# Patient Record
Sex: Male | Born: 1960 | Race: Black or African American | Hispanic: No | Marital: Married | Smoking: Current every day smoker
Health system: Southern US, Community
[De-identification: ages and names within clinical notes are randomized; demographics above are authoritative.]

## PROBLEM LIST (undated history)

## (undated) DIAGNOSIS — I10 Essential (primary) hypertension: Secondary | ICD-10-CM

## (undated) DIAGNOSIS — G4730 Sleep apnea, unspecified: Secondary | ICD-10-CM

## (undated) DIAGNOSIS — E119 Type 2 diabetes mellitus without complications: Secondary | ICD-10-CM

## (undated) DIAGNOSIS — R079 Chest pain, unspecified: Secondary | ICD-10-CM

## (undated) HISTORY — DX: Sleep apnea, unspecified: G47.30

## (undated) HISTORY — DX: Type 2 diabetes mellitus without complications: E11.9

## (undated) HISTORY — DX: Chest pain, unspecified: R07.9

## (undated) HISTORY — PX: LIPOMA EXCISION: SHX5283

## (undated) HISTORY — DX: Essential (primary) hypertension: I10

---

## 2004-04-13 ENCOUNTER — Ambulatory Visit: Payer: Self-pay | Admitting: General Practice

## 2007-01-17 ENCOUNTER — Other Ambulatory Visit: Payer: Self-pay

## 2007-01-17 ENCOUNTER — Emergency Department: Payer: Self-pay | Admitting: Emergency Medicine

## 2007-02-21 ENCOUNTER — Ambulatory Visit: Payer: Self-pay | Admitting: Family Medicine

## 2007-03-03 ENCOUNTER — Emergency Department: Payer: Self-pay | Admitting: Emergency Medicine

## 2007-12-17 ENCOUNTER — Emergency Department: Payer: Self-pay | Admitting: Emergency Medicine

## 2008-08-19 ENCOUNTER — Emergency Department: Payer: Self-pay | Admitting: Emergency Medicine

## 2009-02-05 ENCOUNTER — Emergency Department: Payer: Self-pay | Admitting: Emergency Medicine

## 2009-06-24 ENCOUNTER — Emergency Department: Payer: Self-pay | Admitting: Emergency Medicine

## 2011-05-14 ENCOUNTER — Emergency Department: Payer: Self-pay | Admitting: *Deleted

## 2011-08-06 ENCOUNTER — Emergency Department: Payer: Self-pay | Admitting: Emergency Medicine

## 2012-10-09 ENCOUNTER — Emergency Department: Payer: Self-pay | Admitting: Emergency Medicine

## 2012-12-15 ENCOUNTER — Emergency Department: Payer: Self-pay | Admitting: Emergency Medicine

## 2013-01-18 ENCOUNTER — Ambulatory Visit: Payer: Self-pay | Admitting: Family Medicine

## 2013-11-19 ENCOUNTER — Ambulatory Visit: Payer: Self-pay | Admitting: Gastroenterology

## 2013-12-09 ENCOUNTER — Inpatient Hospital Stay: Payer: Self-pay | Admitting: Internal Medicine

## 2013-12-09 LAB — URINALYSIS, COMPLETE
Bacteria: NONE SEEN
Bilirubin,UR: NEGATIVE
Leukocyte Esterase: NEGATIVE
Nitrite: NEGATIVE
PH: 5 (ref 4.5–8.0)
Protein: NEGATIVE
RBC,UR: 1 /HPF (ref 0–5)
Specific Gravity: 1.026 (ref 1.003–1.030)
Squamous Epithelial: <1
WBC UR: <1 /HPF (ref 0–5)

## 2013-12-09 LAB — COMPREHENSIVE METABOLIC PANEL
ALBUMIN: 3.6 g/dL (ref 3.4–5.0)
ANION GAP: 12 (ref 7–16)
AST: 44 U/L — AB (ref 15–37)
Alkaline Phosphatase: 169 U/L — ABNORMAL HIGH
BUN: 23 mg/dL — AB (ref 7–18)
Bilirubin,Total: 0.8 mg/dL (ref 0.2–1.0)
CREATININE: 0.49 mg/dL — AB (ref 0.60–1.30)
Chloride: 94 mmol/L — ABNORMAL LOW (ref 98–107)
Co2: 22 mmol/L (ref 21–32)
GLUCOSE: 711 mg/dL — AB (ref 65–99)
OSMOLALITY: 298 (ref 275–301)
Potassium: 5.2 mmol/L — ABNORMAL HIGH (ref 3.5–5.1)
SGPT (ALT): 61 U/L (ref 12–78)
Sodium: 128 mmol/L — ABNORMAL LOW (ref 136–145)

## 2013-12-09 LAB — CBC
HCT: 43 % (ref 40.0–52.0)
HGB: 15.4 g/dL (ref 13.0–18.0)
MCH: 33.9 pg (ref 26.0–34.0)
MCHC: 35.8 g/dL (ref 32.0–36.0)
MCV: 95 fL (ref 80–100)
Platelet: 227 10*3/uL (ref 150–440)
RBC: 4.54 10*6/uL (ref 4.40–5.90)
RDW: 12.6 % (ref 11.5–14.5)
WBC: 15.6 10*3/uL — ABNORMAL HIGH (ref 3.8–10.6)

## 2013-12-09 LAB — TROPONIN I: Troponin-I: <0.02 ng/mL

## 2013-12-10 LAB — BASIC METABOLIC PANEL
Anion Gap: 10 (ref 7–16)
BUN: 11 mg/dL (ref 7–18)
Calcium, Total: 8.1 mg/dL — ABNORMAL LOW (ref 8.5–10.1)
Chloride: 104 mmol/L (ref 98–107)
Co2: 21 mmol/L (ref 21–32)
Creatinine: 0.28 mg/dL — ABNORMAL LOW (ref 0.60–1.30)
EGFR (African American): >60
EGFR (Non-African Amer.): >60
Glucose: 222 mg/dL — ABNORMAL HIGH (ref 65–99)
Osmolality: 276 (ref 275–301)
Potassium: 4.7 mmol/L (ref 3.5–5.1)
Sodium: 135 mmol/L — ABNORMAL LOW (ref 136–145)

## 2013-12-10 LAB — CBC WITH DIFFERENTIAL/PLATELET
Comment - H1-Com1: NORMAL
HCT: 36.5 % — ABNORMAL LOW (ref 40.0–52.0)
HGB: 13 g/dL (ref 13.0–18.0)
Lymphocytes: 33 %
MCH: 32.6 pg (ref 26.0–34.0)
MCHC: 35.6 g/dL (ref 32.0–36.0)
MCV: 91 fL (ref 80–100)
Monocytes: 9 %
Platelet: 194 10*3/uL (ref 150–440)
RBC: 4 10*6/uL — ABNORMAL LOW (ref 4.40–5.90)
RDW: 12.5 % (ref 11.5–14.5)
Segmented Neutrophils: 58 %
WBC: 13.3 10*3/uL — ABNORMAL HIGH (ref 3.8–10.6)

## 2013-12-10 LAB — TSH: Thyroid Stimulating Horm: 0.9 u[IU]/mL

## 2013-12-10 LAB — HEMOGLOBIN A1C: Hemoglobin A1C: 13.5 % — ABNORMAL HIGH (ref 4.2–6.3)

## 2013-12-11 LAB — CBC WITH DIFFERENTIAL/PLATELET
Basophil #: 0 10*3/uL (ref 0.0–0.1)
Basophil %: 0.3 %
EOS ABS: 0.1 10*3/uL (ref 0.0–0.7)
Eosinophil %: 1.3 %
HCT: 36.1 % — AB (ref 40.0–52.0)
HGB: 12.3 g/dL — AB (ref 13.0–18.0)
Lymphocyte #: 4.1 10*3/uL — ABNORMAL HIGH (ref 1.0–3.6)
Lymphocyte %: 39.7 %
MCH: 31.2 pg (ref 26.0–34.0)
MCHC: 34.1 g/dL (ref 32.0–36.0)
MCV: 92 fL (ref 80–100)
MONO ABS: 0.7 x10 3/mm (ref 0.2–1.0)
Monocyte %: 7.2 %
Neutrophil #: 5.4 10*3/uL (ref 1.4–6.5)
Neutrophil %: 51.5 %
Platelet: 162 10*3/uL (ref 150–440)
RBC: 3.94 10*6/uL — AB (ref 4.40–5.90)
RDW: 12.4 % (ref 11.5–14.5)
WBC: 10.4 10*3/uL (ref 3.8–10.6)

## 2013-12-11 LAB — BASIC METABOLIC PANEL
Anion Gap: 5 — ABNORMAL LOW (ref 7–16)
BUN: 10 mg/dL (ref 7–18)
CHLORIDE: 103 mmol/L (ref 98–107)
CO2: 26 mmol/L (ref 21–32)
Calcium, Total: 8.5 mg/dL (ref 8.5–10.1)
Creatinine: 0.59 mg/dL — ABNORMAL LOW (ref 0.60–1.30)
EGFR (African American): >60
EGFR (Non-African Amer.): >60
GLUCOSE: 193 mg/dL — AB (ref 65–99)
Osmolality: 273 (ref 275–301)
Potassium: 4 mmol/L (ref 3.5–5.1)
Sodium: 134 mmol/L — ABNORMAL LOW (ref 136–145)

## 2013-12-14 LAB — CULTURE, BLOOD (SINGLE)

## 2014-10-26 NOTE — Discharge Summary (Signed)
PATIENT NAME:  [name], [name] MR#:  [id number] DATE OF BIRTH:  17-Jan-1961  DATE OF ADMISSION:  12/09/2013 DATE OF DISCHARGE:  12/11/2013  DIAGNOSES AT TIME OF DISCHARGE: 1.  Uncontrolled diabetes with severe hyperglycemia secondary to tooth infection.  2.  Hypertension.  3.  Chronic tobacco use.   CHIEF COMPLAINT:  Blurred vision, polydipsia, polyphagia and elevated sugars.   Johaan Ryser is a 54 year old African American male with a history of diabetes since 2012,  history of hypertension, presents to the ED complaining of pain in the left lower teeth on the posterior aspect for the last few days. He has also noticed increased frequency of urination and increased thirst associated with blurring of vision. The patient's blood sugar was noted to be elevated in the ED at 711 and was given IV insulin and subsequently admitted. The patient has been complaining of pain, tooth infection. Denies any chest pain, fevers, chills or vomiting.   PAST MEDICAL HISTORY:  Significant for type 2 diabetes and hypertension. He has had a cyst  resected from his head. Please see H and P for other details.   HOSPITAL COURSE: The patient was admitted to [facility] and received IV fluids, I and also placed on a sliding scale and this was subsequently switched to glipizide, and he was also started on IV Unasyn. He was advised to quit smoking completely and nicotine replacement therapy was also started. During his stay in the hospital, the patient's sugars gradually improved and was 193 on day of discharge. He was started on glipizide and metformin. Blood cultures were negative. UA was also negative. The patient was discharged in stable condition on the following medications:  Penicillin V potassium 500 mg p.o. q.i.d. for 10 days, nicotine patch 14 mg a day for 24 hours, decrease to 7 mg a day per 24 hours for 2 weeks and then stop; glipizide 10 mg b.i.d., oxybutynin 5 mg once a day, losartan 50 mg once a day, amlodipine 10 mg once  a day, metformin 500 mg p.o. b.i.d.   The patient has been advised to continue to monitor his sugars at home. Advised a strict low-carb diet and follow up with me, Dr. Ginette Pitman, it 1 to 2 weeks' time. I also advised him to see a dentist as soon as possible. He has been advised to call back if there are any questions or concerns. The patient was stable at the time of discharge.   TOTAL TIME SPENT IN DISCHARGING THE PATIENT: 35 minutes.   ____________________________ Tracie Harrier, MD vh:dmm D: 12/13/2013 15:45:02 ET T: 12/13/2013 17:41:16 ET JOB#: [id number]  cc: Tracie Harrier, MD, <Dictator> Tracie Harrier MD ELECTRONICALLY SIGNED 01/01/2014 18:06

## 2014-10-26 NOTE — H&P (Signed)
PATIENT NAME:  [name], [name] MR#:  [id number] DATE OF BIRTH:  Jun 12, 1961  DATE OF ADMISSION:  12/09/2013  PRIMARY CARE PROVIDER: Tracie Harrier, MD  EMERGENCY DEPARTMENT REFERRING PHYSICIAN: Sheryl L. Benjaman Lobe, MD  CHIEF COMPLAINT: Blurred vision, blood glucose very high, polydipsia, polyphagia.   HISTORY OF PRESENT ILLNESS: The patient is a 54 year old African American male with history of diabetes since 2012, history of hypertension, who reports that he started having pain in the left side of his lower teeth on the posterior aspect. Over the past few days, he has been  noticing that he has to urinate very frequently and has been very thirsty. He also noticed to have blurred vision. Due to these symptoms, he came to the ED and his blood sugar on a BMP showed a glucose of 711. The patient was given 1 dose of IV insulin. We were asked to admit the patient. He likely has a tooth infection as well. He otherwise denies any fevers, chills. No chest pains, palpitations. Complains of dry cough. No wheezing. No shortness of breath. No nausea, vomiting, diarrhea. Complains of polydipsia, polyuria.   PAST MEDICAL HISTORY: 1.  Diabetes since 2012.  2.  Hypertension.   PAST SURGICAL HISTORY: Cyst resection from head.   ALLERGIES: INTRAVENOUS PYELOGRAM DYE.   CURRENT MEDICATIONS: He is on oxybutynin 5 mg q.24 hours, metformin 500 mg 1 tab p.o. b.i.d., losartan 50 mg 1 tab p.o. daily, amlodipine 10 daily.   SOCIAL HISTORY: Smokes less than 1 pack every 3 days, he has been smoking since the age of 20. No alcohol or drug use.   FAMILY HISTORY: Father with diabetes.   REVIEW OF SYSTEMS:    CONSTITUTIONAL: Denies any fevers. Complains of some fatigue, weakness. No pain. No weight loss. No weight gain.  EYES: He complains of blurred vision. No double vision. No pain. No redness. No inflammation.  ENT: No tinnitus. No ear pain. No hearing loss. No allergies, seasonal or year-round allergies. No epistaxis.  No difficulty swallowing.  RESPIRATORY: Complains of dry cough. No wheezing. No hemoptysis. No chronic obstructive pulmonary disease.  CARDIOVASCULAR: No chest pain, orthopnea, edema or arrhythmia.  GASTROINTESTINAL: No nausea, vomiting, diarrhea. No abdominal pain. No hematemesis. No melena. No gastroesophageal reflux disease, no irritable bowel syndrome. No jaundice.  GENITOURINARY: Denies any dysuria, hematuria.  ENDOCRINE:  Denies any polyuria, nocturia or thyroid problems.  HEMATOLOGIC AND LYMPHATIC: Denies anemia, easy bruisability or bleeding.  SKIN: No acne. No rash.  MUSCULOSKELETAL: Denies any pain in the neck, back or shoulder.  NEUROLOGIC: No numbness, cerebrovascular accident,  transient ischemic attack.  PSYCHIATRIC: No anxiety, insomnia or attention deficit disorder.   PHYSICAL EXAMINATION: VITAL SIGNS: Temperature 98.2, pulse 115, respirations 18, blood pressure 154/92, O2 of 95%.  GENERAL: The patient is a well-developed male in no acute distress.  HEENT:  Normocephalic. Pupils equally round, reactive to light and accommodation. There is no conjunctival pallor. No scleral icterus. Oropharynx: He has a tooth on the left posterior aspect of his mouth with some inflammation as well as some drainage.  NECK: Supple without any JVD.  CARDIOVASCULAR: Regular rate and rhythm. No murmurs, rubs, clicks or gallops.  LUNGS: Clear to auscultation bilaterally without any rales, rhonchi, wheezing.  ABDOMEN: Soft, nontender, nondistended. Positive bowel sounds x 4.  EXTREMITIES: No clubbing, cyanosis or edema.  SKIN: No rash.  LYMPHATICS: No lymph nodes palpable.  VASCULAR: Good DP and PT pulses.   PSYCHIATRIC: Not anxious or depressed.  NEUROLOGIC: Awake, alert, oriented  x 3. No focal deficits.   LABORATORY, DIAGNOSTIC AND RADIOLOGICAL DATA: Urinalysis: Nitrites negative, leukocytes negative. Glucose 711, sodium 128, chloride 94, CO2 22, bilirubin total 0.8. Troponin less than 0.02. WBC  15.6, hemoglobin 15.4, platelet count 227.   ASSESSMENT AND PLAN: The patient is a 54 year old African American male with history of diabetes, on p.o. metformin, is presenting the ED with severe hyperglycemia, likely tooth infection.  1.  Severe hyperglycemia, likely due to dietary indiscretion and tooth infection. At this time, he will receive IV insulin dose x 1. He will be placed on an insulin drip. Monitor his blood sugars every hour. . I will increase his dose of metformin. I will add glipizide. The patient is very hesitant to start insulin at home. Also, his sugars could be exacerbated by the tooth infection that he has so he may be okay with oral treatment. We will also check a hemoglobin A1c.   2.  Tooth infection.  He will be on IV Unasyn. He will need outpatient dental evaluation with extraction of that tooth.  3.  Hypertension. Consent losartan and amlodipine.  4.  Miscellaneous. He will be on heparin for deep vein thrombosis prophylaxis.  5.  Nicotine addiction. The patient was given smoking cessation, 4 minutes spent. The patient will be started on a nicotine patch.    TIME SPENT:  50 minutes.     ____________________________ Lafonda Mosses Posey Pronto, MD shp:cs D: 12/09/2013 16:08:12 ET T: 12/09/2013 18:16:29 ET JOB#: [id number]  cc: Masiah Lewing H. Posey Pronto, MD, <Dictator> Alric Seton MD ELECTRONICALLY SIGNED 12/18/2013 16:52

## 2015-05-25 ENCOUNTER — Ambulatory Visit: Payer: No Typology Code available for payment source | Attending: Neurology

## 2015-05-25 DIAGNOSIS — G4733 Obstructive sleep apnea (adult) (pediatric): Secondary | ICD-10-CM | POA: Diagnosis not present

## 2015-05-25 DIAGNOSIS — R0683 Snoring: Secondary | ICD-10-CM | POA: Diagnosis present

## 2015-05-25 DIAGNOSIS — G4761 Periodic limb movement disorder: Secondary | ICD-10-CM | POA: Insufficient documentation

## 2015-07-06 HISTORY — PX: COLONOSCOPY: SHX174

## 2015-08-15 ENCOUNTER — Ambulatory Visit: Payer: BLUE CROSS/BLUE SHIELD | Attending: Neurology

## 2015-08-15 DIAGNOSIS — G4733 Obstructive sleep apnea (adult) (pediatric): Secondary | ICD-10-CM | POA: Insufficient documentation

## 2017-02-07 ENCOUNTER — Ambulatory Visit: Payer: No Typology Code available for payment source | Admitting: Dietician

## 2017-02-11 ENCOUNTER — Encounter: Payer: Self-pay | Admitting: *Deleted

## 2017-02-11 ENCOUNTER — Encounter: Payer: BLUE CROSS/BLUE SHIELD | Attending: Internal Medicine | Admitting: *Deleted

## 2017-02-11 VITALS — BP 142/80 | Ht 73.0 in | Wt 229.5 lb

## 2017-02-11 DIAGNOSIS — E119 Type 2 diabetes mellitus without complications: Secondary | ICD-10-CM | POA: Insufficient documentation

## 2017-02-11 DIAGNOSIS — Z713 Dietary counseling and surveillance: Secondary | ICD-10-CM | POA: Diagnosis not present

## 2017-02-11 DIAGNOSIS — E1165 Type 2 diabetes mellitus with hyperglycemia: Secondary | ICD-10-CM

## 2017-02-11 NOTE — Progress Notes (Signed)
Diabetes Self-Management Education  Visit Type: First/Initial  Appt. Start Time: 1115 Appt. End Time: 1225  02/11/2017  [name], identified by name and date of birth, is a 56 y.o. male with a diagnosis of Diabetes: Type 2.   ASSESSMENT  Blood pressure (!) 142/80, height 6\' 1"  (1.854 m), weight 229 lb 8 oz (104.1 kg). Body mass index is 30.28 kg/m.      Diabetes Self-Management Education - 02/11/17 1252      Visit Information   Visit Type First/Initial     Initial Visit   Diabetes Type Type 2   Are you currently following a meal plan? No   Are you taking your medications as prescribed? Yes   Date Diagnosed 6+ years     Health Coping   How would you rate your overall health? Fair     Psychosocial Assessment   Patient Belief/Attitude about Diabetes Other (comment)  "confused" - not sure why he has diabetes   Self-care barriers None   Self-management support Doctor's office;Family   Patient Concerns Nutrition/Meal planning;Medication;Glycemic Control;Weight Control;Monitoring   Special Needs None   Preferred Learning Style Auditory   Learning Readiness Ready   How often do you need to have someone help you when you read instructions, pamphlets, or other written materials from your doctor or pharmacy? 1 - Never   What is the last grade level you completed in school? 12th grade     Pre-Education Assessment   Patient understands the diabetes disease and treatment process. Needs Instruction   Patient understands incorporating nutritional management into lifestyle. Needs Instruction   Patient undertands incorporating physical activity into lifestyle. Needs Instruction   Patient understands using medications safely. Needs Instruction   Patient understands monitoring blood glucose, interpreting and using results Needs Review   Patient understands prevention, detection, and treatment of acute complications. Needs Instruction   Patient understands prevention, detection,  and treatment of chronic complications. Needs Instruction   Patient understands how to develop strategies to address psychosocial issues. Needs Instruction   Patient understands how to develop strategies to promote health/change behavior. Needs Instruction     Complications   Last HgB A1C per patient/outside source 7.9 %  February 2018   How often do you check your blood sugar? 1-2 times/day   Fasting Blood glucose range (mg/dL) 180-200;>200  Pt reports FBG's 180-200's mg/dL with reading of 187 mg/dL today.    Have you had a dilated eye exam in the past 12 months? No   Have you had a dental exam in the past 12 months? No   Are you checking your feet? Yes   How many days per week are you checking your feet? 2     Dietary Intake   Breakfast eggs with bacon or sausage and 3 pieces white toast; cereal and milk   Lunch hot dog, hamburger and fries, ham or bologna sandwich with chips   Dinner chicken, roast beef, pork, salmon with potatoes, bread, peas, corn, beans, onions, tomatoes, broccoli   Snack (evening) ice cream   Beverage(s) water, diet soda, coffee with sugar, occasional sweet tea     Exercise   Exercise Type ADL's     Patient Education   Previous Diabetes Education No   Disease state  Definition of diabetes, type 1 and 2, and the diagnosis of diabetes;Factors that contribute to the development of diabetes   Nutrition management  Role of diet in the treatment of diabetes and the relationship between the three main  macronutrients and blood glucose level;Reviewed blood glucose goals for pre and post meals and how to evaluate the patients' food intake on their blood glucose level.   Physical activity and exercise  Role of exercise on diabetes management, blood pressure control and cardiac health.   Medications Reviewed patients medication for diabetes, action, purpose, timing of dose and side effects.   Monitoring Purpose and frequency of SMBG.;Taught/discussed recording of test  results and interpretation of SMBG.;Identified appropriate SMBG and/or A1C goals.   Chronic complications Relationship between chronic complications and blood glucose control;Retinopathy and reason for yearly dilated eye exams   Psychosocial adjustment Identified and addressed patients feelings and concerns about diabetes   Personal strategies to promote health Review risk of smoking and offered smoking cessation     Individualized Goals (developed by patient)   Reducing Risk Improve blood sugars Decrease medications Prevent diabetes complications Lose weight Quit smoking     Outcomes   Expected Outcomes Demonstrated interest in learning. Expect positive outcomes   Future DMSE 4-6 wks      Individualized Plan for Diabetes Self-Management Training:   Learning Objective:  Patient will have a greater understanding of diabetes self-management. Patient education plan is to attend individual and/or group sessions per assessed needs and concerns.   Plan:   Patient Instructions  Check blood sugars 1 x day before breakfast or 2 hrs after one meal every day Bring blood sugar records to the next class Exercise: Begin walking  for 10-15  minutes  3 days a week and gradually increase to 150 minutes/week Eat 3 meals day, 1-2 snacks a day Space meals 4-6 hours apart Don't skip meals Limit fried foods and foods high in fat Avoid sugar sweetened drinks (coffee) Quit smoking Make an eye doctor appointment   Expected Outcomes:  Demonstrated interest in learning. Expect positive outcomes  Education material provided:  General Meal Planning Guidelines Simple Meal Plan  If problems or questions, patient to contact team via:  Johny Drilling, RN, [place], CDE [phone]  Future DSME appointment: 4-6 wks  Sept 6, 2018 for Diabetes Class 1

## 2017-02-11 NOTE — Patient Instructions (Signed)
Check blood sugars 1 x day before breakfast or 2 hrs after one meal every day Bring blood sugar records to the next class  Exercise: Begin walking  for 10-15  minutes  3 days a week and gradually increase to 150 minutes/week  Eat 3 meals day, 1-2 snacks a day Space meals 4-6 hours apart Don't skip meals Limit fried foods and foods high in fat Avoid sugar sweetened drinks (coffee)  Quit smoking  Make an eye doctor appointment  Return for classes on:

## 2017-03-10 ENCOUNTER — Encounter: Payer: Self-pay | Admitting: Dietician

## 2017-03-10 ENCOUNTER — Encounter: Payer: BLUE CROSS/BLUE SHIELD | Attending: Internal Medicine | Admitting: Dietician

## 2017-03-10 VITALS — Wt 228.2 lb

## 2017-03-10 DIAGNOSIS — E119 Type 2 diabetes mellitus without complications: Secondary | ICD-10-CM | POA: Insufficient documentation

## 2017-03-10 DIAGNOSIS — Z713 Dietary counseling and surveillance: Secondary | ICD-10-CM | POA: Diagnosis not present

## 2017-03-10 DIAGNOSIS — E1165 Type 2 diabetes mellitus with hyperglycemia: Secondary | ICD-10-CM

## 2017-03-10 NOTE — Progress Notes (Signed)

## 2017-03-17 ENCOUNTER — Ambulatory Visit: Payer: No Typology Code available for payment source

## 2017-03-17 ENCOUNTER — Telehealth: Payer: Self-pay | Admitting: *Deleted

## 2017-03-17 NOTE — Telephone Encounter (Signed)
Patient was no show for Class 2 today. Called him and he reported that he thought today was Wednesday. He reports numbers "jumping around". He had 129 mg/dL one evening but the next morning was 200's mg/dL. Discussed meal planning and stress that affects his blood sugars. He reports that his brother died 2 weeks ago and his mother is now in the hospital. Instructed him to come next week to Class 3 and we can reschedule Class 2 at that time.

## 2017-03-24 ENCOUNTER — Ambulatory Visit: Payer: No Typology Code available for payment source

## 2017-03-25 ENCOUNTER — Telehealth: Payer: Self-pay | Admitting: Dietician

## 2017-03-25 NOTE — Telephone Encounter (Signed)
Patient missed the past 2 classes on 03/17/17 and 03/24/17; called and left a voicemail message for him to call back to reschedule.

## 2017-05-02 ENCOUNTER — Encounter: Payer: Self-pay | Admitting: *Deleted

## 2017-07-05 DIAGNOSIS — R079 Chest pain, unspecified: Secondary | ICD-10-CM

## 2017-07-05 HISTORY — DX: Chest pain, unspecified: R07.9

## 2017-10-04 ENCOUNTER — Telehealth: Payer: Self-pay | Admitting: General Surgery

## 2017-10-04 ENCOUNTER — Encounter: Payer: Self-pay | Admitting: General Surgery

## 2017-10-11 NOTE — Telephone Encounter (Signed)
error 

## 2017-11-02 ENCOUNTER — Encounter: Payer: Self-pay | Admitting: *Deleted

## 2017-11-08 ENCOUNTER — Encounter: Payer: Self-pay | Admitting: General Surgery

## 2017-11-08 ENCOUNTER — Ambulatory Visit: Payer: BLUE CROSS/BLUE SHIELD | Admitting: General Surgery

## 2017-11-08 VITALS — BP 168/96 | HR 78 | Resp 12 | Ht 73.0 in | Wt 238.0 lb

## 2017-11-08 DIAGNOSIS — D171 Benign lipomatous neoplasm of skin and subcutaneous tissue of trunk: Secondary | ICD-10-CM | POA: Diagnosis not present

## 2017-11-08 MED ORDER — DIAZEPAM 10 MG PO TABS: 10.0000 mg | ORAL_TABLET | Freq: Once | ORAL | 0 refills | Status: AC

## 2017-11-08 NOTE — Progress Notes (Signed)
**Note Tyler-Identified via Obfuscation** Patient ID: [name], male   DOB: [birth year], 57 y.o.   MRN: [id number]  Chief Complaint  Patient presents with  . Lipoma    HPI [name] is a 57 y.o. male here today for a evaluation of 2 lipomas on back . He states they have been there for 5 years and have gotten larger over 9 months. He states they are uncomfortable when he leans on his back in a chair. He also has one on his left and right chest and right upper arm. The left back lesion is most symptomatic, especially when he sits in a hard back chair. Followed by Dr Saralyn Pilar for intermittent  chest pain. He is here with [name], his wife. He works as a Engineer, maintenance (IT) at [employer].  HPI  Past Medical History:  Diagnosis Date  . Chest pain 2019  . Diabetes mellitus without complication ([place])   . Hypertension   . Sleep apnea     Past Surgical History:  Procedure Laterality Date  . COLONOSCOPY  2017  . LIPOMA EXCISION     Left forehead    Family History  Problem Relation Age of Onset  . Diabetes Father   . Colon cancer Neg Hx     Social History Social History   Tobacco Use  . Smoking status: Current Every Day Smoker    Packs/day: 0.30    Years: 32.00    Pack years: 9.60    Types: Cigarettes  . Smokeless tobacco: Never Used  Substance Use Topics  . Alcohol use: No  . Drug use: Never    Allergies  Allergen Reactions  . Enalapril Maleate Anaphylaxis  . Canagliflozin Other (See Comments)    Current Outpatient Medications  Medication Sig Dispense Refill  . aspirin EC 81 MG tablet Take 81 mg by mouth daily.    Marland Kitchen losartan (COZAAR) 25 MG tablet Take 25 mg by mouth daily.    . metFORMIN (GLUCOPHAGE-XR) 500 MG 24 hr tablet Take 2,000 mg by mouth daily with supper.    . saxagliptin HCl (ONGLYZA) 5 MG TABS tablet Take 5 mg by mouth daily with breakfast.    . diazepam (VALIUM) 10 MG tablet Take 1 tablet (10 mg total) by mouth once for 1 dose. Take one hour Prior to procedure 1 tablet 0   No current  facility-administered medications for this visit.     Review of Systems Review of Systems  Constitutional: Negative.   Respiratory: Negative.   Cardiovascular: Negative.     Blood pressure (!) 168/96, pulse 78, resp. rate 12, height 6\' 1"  (1.854 m), weight 238 lb (108 kg), SpO2 96 %.  Physical Exam Physical Exam  Constitutional: He is oriented to person, place, and time. He appears well-developed and well-nourished.  HENT:  Mouth/Throat: No oropharyngeal exudate.  Eyes: No scleral icterus.  Neck: Normal range of motion. Neck supple.  Cardiovascular: Normal rate, regular rhythm and normal heart sounds.  Pulmonary/Chest: Effort normal and breath sounds normal.        Abdominal:    Neurological: He is alert and oriented to person, place, and time.  Skin: Skin is warm and dry.  Left back lower scapula 8 x 8 cm lipoma. Right medial scapula 4 x 5 cm lipoma. Right anterior shoulder 3 x 4 cm lipoma. Right upper abdomen mid axillary line  5 x 5 cm lipoma and right lower quadrant 3 x 4 cm.   Psychiatric: His behavior is normal.    Data Reviewed  Operatory studies dated September 30, 2017 were reviewed.  Nonfasting blood sugar 160.  Normal creatinine of 0.89 with an estimated GFR of 110.  Normal electrolytes.  Minimal elevation of the SGPT at 45.  Elevated cholesterol and triglycerides. PCP notes of September 30, 2017.  Assessment    Multiple lipomas, the most symptomatic is the largest lesion on the left posterior back.     Plan   Recommend excision left back lipoma in the office. He states he is exteremly nervous about needle sticks, will prescribe valium 10 mg one and sign consent today.   HPI, Physical Exam, Assessment and Plan have been scribed under the direction and in the presence of [name], MD. [name], RN  I have completed the exam and reviewed the above documentation for accuracy and completeness.  I agree with the above.  Haematologist has been used  and any errors in dictation or transcription are unintentional.  [name], M.D., F.A.C.S.   [name] 11/08/2017, 5:49 PM

## 2017-11-08 NOTE — Patient Instructions (Signed)
Recommend excision left back lipoma in the office

## 2017-12-13 ENCOUNTER — Emergency Department
Admission: EM | Admit: 2017-12-13 | Discharge: 2017-12-13 | Disposition: A | Discharge status: Home / Self Care | Payer: BLUE CROSS/BLUE SHIELD | Attending: Emergency Medicine | Admitting: Emergency Medicine

## 2017-12-13 ENCOUNTER — Ambulatory Visit: Payer: BLUE CROSS/BLUE SHIELD | Admitting: General Surgery

## 2017-12-13 ENCOUNTER — Emergency Department: Payer: BLUE CROSS/BLUE SHIELD

## 2017-12-13 ENCOUNTER — Other Ambulatory Visit: Payer: Self-pay

## 2017-12-13 ENCOUNTER — Encounter: Payer: Self-pay | Admitting: Emergency Medicine

## 2017-12-13 DIAGNOSIS — R109 Unspecified abdominal pain: Secondary | ICD-10-CM | POA: Diagnosis not present

## 2017-12-13 DIAGNOSIS — K5900 Constipation, unspecified: Secondary | ICD-10-CM | POA: Insufficient documentation

## 2017-12-13 DIAGNOSIS — I10 Essential (primary) hypertension: Secondary | ICD-10-CM | POA: Insufficient documentation

## 2017-12-13 DIAGNOSIS — E119 Type 2 diabetes mellitus without complications: Secondary | ICD-10-CM | POA: Diagnosis not present

## 2017-12-13 DIAGNOSIS — K859 Acute pancreatitis without necrosis or infection, unspecified: Secondary | ICD-10-CM | POA: Insufficient documentation

## 2017-12-13 DIAGNOSIS — Z7984 Long term (current) use of oral hypoglycemic drugs: Secondary | ICD-10-CM | POA: Diagnosis not present

## 2017-12-13 DIAGNOSIS — F17210 Nicotine dependence, cigarettes, uncomplicated: Secondary | ICD-10-CM | POA: Insufficient documentation

## 2017-12-13 DIAGNOSIS — M5489 Other dorsalgia: Secondary | ICD-10-CM | POA: Diagnosis not present

## 2017-12-13 DIAGNOSIS — R079 Chest pain, unspecified: Secondary | ICD-10-CM | POA: Diagnosis present

## 2017-12-13 LAB — URINALYSIS, COMPLETE (UACMP) WITH MICROSCOPIC
BACTERIA UA: NONE SEEN
Bilirubin Urine: NEGATIVE
Glucose, UA: >=500 mg/dL — AB
Hgb urine dipstick: NEGATIVE
KETONES UR: NEGATIVE mg/dL
Leukocytes, UA: NEGATIVE
Nitrite: NEGATIVE
PH: 6 (ref 5.0–8.0)
Protein, ur: 100 mg/dL — AB
SQUAMOUS EPITHELIAL / LPF: NONE SEEN (ref 0–5)
Specific Gravity, Urine: 1.018 (ref 1.005–1.030)

## 2017-12-13 LAB — COMPREHENSIVE METABOLIC PANEL
ALK PHOS: 64 U/L (ref 38–126)
ALT: 59 U/L (ref 17–63)
AST: 59 U/L — AB (ref 15–41)
Albumin: 3.7 g/dL (ref 3.5–5.0)
Anion gap: 10 (ref 5–15)
BILIRUBIN TOTAL: 0.8 mg/dL (ref 0.3–1.2)
BUN: 10 mg/dL (ref 6–20)
CALCIUM: 9.1 mg/dL (ref 8.9–10.3)
CO2: 25 mmol/L (ref 22–32)
Chloride: 101 mmol/L (ref 101–111)
Creatinine, Ser: 0.68 mg/dL (ref 0.61–1.24)
GFR calc Af Amer: >60 mL/min (ref >60)
GFR calc non Af Amer: >60 mL/min (ref >60)
Glucose, Bld: 212 mg/dL — ABNORMAL HIGH (ref 65–99)
POTASSIUM: 4.4 mmol/L (ref 3.5–5.1)
Sodium: 136 mmol/L (ref 135–145)
TOTAL PROTEIN: 8.4 g/dL — AB (ref 6.5–8.1)

## 2017-12-13 LAB — CBC
HEMATOCRIT: 41.7 % (ref 40.0–52.0)
HEMOGLOBIN: 14.3 g/dL (ref 13.0–18.0)
MCH: 31.6 pg (ref 26.0–34.0)
MCHC: 34.3 g/dL (ref 32.0–36.0)
MCV: 92.1 fL (ref 80.0–100.0)
Platelets: 237 10*3/uL (ref 150–440)
RBC: 4.52 MIL/uL (ref 4.40–5.90)
RDW: 13.4 % (ref 11.5–14.5)
WBC: 13 10*3/uL — ABNORMAL HIGH (ref 3.8–10.6)

## 2017-12-13 LAB — LIPASE, BLOOD: Lipase: 81 U/L — ABNORMAL HIGH (ref 11–51)

## 2017-12-13 LAB — TROPONIN I: Troponin I: 0.03 ng/mL (ref <0.03)

## 2017-12-13 MED ORDER — HYDROCODONE-ACETAMINOPHEN 5-325 MG PO TABS: 1.0000 | ORAL_TABLET | ORAL | 0 refills | Status: AC | PRN

## 2017-12-13 MED ORDER — POLYETHYLENE GLYCOL 3350 17 GM/SCOOP PO POWD: 17.0000 g | Freq: Every day | ORAL | 0 refills | Status: AC | PRN

## 2017-12-13 NOTE — ED Notes (Signed)

## 2017-12-13 NOTE — ED Triage Notes (Signed)
Pt c/o mid chest pain radiating down right side of abdomen and to back x 2 weeks, pt is smoker and diabetic and has HTN.   Pt states he has been SOB with the pain.

## 2017-12-13 NOTE — Discharge Instructions (Signed)
Please take your medications as prescribed.  As we discussed for the next 7 days please adhere to a very low-fat diet.  Please drink plenty of fluids.  Also as we discussed your CT scan of the chest showed an enlarged thyroid nodule please follow-up with your doctor to arrange an ultrasound of your thyroid.  It also showed a 7 mm right upper lobe lung nodule.  Please follow-up with your doctor to arrange a follow-up CT scan in 1 year for further evaluation.

## 2017-12-13 NOTE — ED Provider Notes (Addendum)
[facility] Emergency Department Provider Note  Time seen: 10:32 AM  I have reviewed the triage vital signs and the nursing notes.   HISTORY  Chief Complaint Chest Pain; Abdominal Pain; and Back Pain    HPI [name] is a 57 y.o. male with a past medical history of diabetes, hypertension, presents to the emergency department for chest discomfort.  According to the patient for the past 2 weeks he has been experiencing intermittent left chest pain.  States it is worse while he was up especially at work, and then is somewhat resolved when he is able to go home and relax.  Denies any cough.  States he does get short of breath from time to time, denies any nausea but states he also gets sweaty especially at nighttime but denies any association of sweatiness with the chest pain.  No leg pain or swelling.  Patient used to see a cardiologist but states he no longer does.  Denies any significant discomfort at this time but states he is aware of discomfort in his left chest right now.   Past Medical History:  Diagnosis Date  . Chest pain 2019  . Diabetes mellitus without complication ([place])   . Hypertension   . Sleep apnea     Patient Active Problem List   Diagnosis Date Noted  . Lipoma of back 11/08/2017    Past Surgical History:  Procedure Laterality Date  . COLONOSCOPY  2017  . LIPOMA EXCISION     Left forehead    Prior to Admission medications   Medication Sig Start Date End Date Taking? Authorizing Provider  aspirin EC 81 MG tablet Take 81 mg by mouth daily.    [provider]  losartan (COZAAR) 25 MG tablet Take 25 mg by mouth daily. 08/26/16   [provider]  metFORMIN (GLUCOPHAGE-XR) 500 MG 24 hr tablet Take 2,000 mg by mouth daily with supper. 12/17/14   [provider]  saxagliptin HCl (ONGLYZA) 5 MG TABS tablet Take 5 mg by mouth daily with breakfast.    [provider]    Allergies  Allergen Reactions  .  Enalapril Maleate Anaphylaxis  . Canagliflozin Other (See Comments)    Family History  Problem Relation Age of Onset  . Diabetes Father   . Colon cancer Neg Hx     Social History Social History   Tobacco Use  . Smoking status: Current Every Day Smoker    Packs/day: 0.30    Years: 32.00    Pack years: 9.60    Types: Cigarettes  . Smokeless tobacco: Never Used  Substance Use Topics  . Alcohol use: No  . Drug use: Never    Review of Systems Constitutional: Negative for fever. Eyes: Negative for visual complaints ENT: Negative for recent illness/congestion Cardiovascular: Left chest pain intermittent x2 weeks Respiratory: Intermittent shortness of breath Gastrointestinal: Negative for abdominal pain, vomiting  Genitourinary: Negative for urinary compaints Musculoskeletal: Negative for lower extremity edema or tenderness Skin: Negative for skin complaints  Neurological: Negative for headache All other ROS negative  ____________________________________________   PHYSICAL EXAM:  VITAL SIGNS: ED Triage Vitals  Enc Vitals Group     BP 12/13/17 0941 (!) 166/109     Pulse Rate 12/13/17 0941 100     Resp 12/13/17 0941 (!) 24     Temp 12/13/17 0941 98.6 F (37 C)     Temp Source 12/13/17 0941 Oral     SpO2 12/13/17 0941 96 %  Weight 12/13/17 0943 225 lb (102.1 kg)     Height 12/13/17 0943 6\' 1"  (1.854 m)     Head Circumference --      Peak Flow --      Pain Score 12/13/17 0943 6     Pain Loc --      Pain Edu? --      Excl. in [place]? --     Constitutional: Alert and oriented. Well appearing and in no distress. Eyes: Normal exam ENT   Head: Normocephalic and atraumatic.   Mouth/Throat: Mucous membranes are moist. Cardiovascular: Normal rate, regular rhythm. No murmur Respiratory: Normal respiratory effort without tachypnea nor retractions. Breath sounds are clear.  Chest wall is nontender. Gastrointestinal: Soft and nontender. No distention.   Musculoskeletal: Nontender with normal range of motion in all extremities. No lower extremity tenderness or edema. Neurologic:  Normal speech and language. No gross focal neurologic deficits Skin:  Skin is warm, dry and intact.  Psychiatric: Mood and affect are normal.  ____________________________________________    EKG  EKG reviewed and interpreted by myself shows normal sinus rhythm 84 bpm with a narrow QRS, normal axis, normal intervals, no concerning ST changes.  ____________________________________________    RADIOLOGY  X-ray shows trachea is shifted to the right suggesting possible paratracheal mass.  ____________________________________________   INITIAL IMPRESSION / ASSESSMENT AND PLAN / ED COURSE  Pertinent labs & imaging results that were available during my care of the patient were reviewed by me and considered in my medical decision making (see chart for details).  Patient presents to the emergency department for chest pain intermittent over the past 2 weeks.  Differential would include musculoskeletal pain, chest wall pain, pneumonia, pneumothorax, ACS.  We will check labs including cardiac panel, chest x-ray and continue to closely monitor.  Patient's labs show mild lipase elevation which could go along with a mild pancreatitis.  Patient does not drink alcohol.  Labs otherwise reassuring.  Patient does have a deviated airway on chest x-ray.  Followed up with a CT scan of the chest which shows a thyroid growth, I discussed this with the patient and recommended ultrasound follow-up through his primary care doctor.  Patient is also complaining of constipation-like pain will place on MiraLAX and have the patient follow-up with his PCP.  Gust the patient's lung nodule as well as thyroid ultrasound, recommendations follow-up with his doctor for repeat CT in 12 months and thyroid ultrasound.  Patient agreeable plan of care.  Highly suspect mild pancreatitis.  We will place  patient on MiraLAX.  We will prescribe a short course of pain medication I also discussed low-fat diet for the next 7 days.  ____________________________________________   FINAL CLINICAL IMPRESSION(S) / ED DIAGNOSES  Chest pain Constipation Mild pancreatitis   Harvest Dark, MD 12/13/17 1256    Harvest Dark, MD 12/13/17 1303

## 2017-12-15 ENCOUNTER — Other Ambulatory Visit: Payer: Self-pay | Admitting: Family Medicine

## 2017-12-15 DIAGNOSIS — E079 Disorder of thyroid, unspecified: Secondary | ICD-10-CM

## 2017-12-22 ENCOUNTER — Ambulatory Visit
Admission: RE | Admit: 2017-12-22 | Discharge: 2017-12-22 | Disposition: A | Discharge status: Home / Self Care | Payer: BLUE CROSS/BLUE SHIELD | Source: Ambulatory Visit | Attending: Family Medicine | Admitting: Family Medicine

## 2017-12-22 DIAGNOSIS — E079 Disorder of thyroid, unspecified: Secondary | ICD-10-CM | POA: Insufficient documentation

## 2018-01-17 ENCOUNTER — Ambulatory Visit: Payer: BLUE CROSS/BLUE SHIELD | Admitting: General Surgery

## 2018-01-20 ENCOUNTER — Inpatient Hospital Stay: Payer: BLUE CROSS/BLUE SHIELD

## 2018-01-20 ENCOUNTER — Inpatient Hospital Stay (HOSPITAL_COMMUNITY)
Admit: 2018-01-20 | Discharge: 2018-01-20 | Disposition: A | Discharge status: Home / Self Care | Payer: BLUE CROSS/BLUE SHIELD | Attending: Internal Medicine | Admitting: Internal Medicine

## 2018-01-20 ENCOUNTER — Emergency Department: Payer: BLUE CROSS/BLUE SHIELD

## 2018-01-20 ENCOUNTER — Inpatient Hospital Stay
Admission: EM | Admit: 2018-01-20 | Discharge: 2018-01-24 | DRG: 208 | Disposition: A | Discharge status: Home / Self Care | Payer: BLUE CROSS/BLUE SHIELD | Attending: Family Medicine | Admitting: Family Medicine

## 2018-01-20 ENCOUNTER — Other Ambulatory Visit: Payer: Self-pay

## 2018-01-20 DIAGNOSIS — J9811 Atelectasis: Secondary | ICD-10-CM | POA: Diagnosis present

## 2018-01-20 DIAGNOSIS — Z6835 Body mass index (BMI) 35.0-35.9, adult: Secondary | ICD-10-CM

## 2018-01-20 DIAGNOSIS — I952 Hypotension due to drugs: Secondary | ICD-10-CM | POA: Diagnosis not present

## 2018-01-20 DIAGNOSIS — I161 Hypertensive emergency: Secondary | ICD-10-CM | POA: Diagnosis present

## 2018-01-20 DIAGNOSIS — Z91041 Radiographic dye allergy status: Secondary | ICD-10-CM | POA: Diagnosis not present

## 2018-01-20 DIAGNOSIS — A419 Sepsis, unspecified organism: Secondary | ICD-10-CM

## 2018-01-20 DIAGNOSIS — J9601 Acute respiratory failure with hypoxia: Secondary | ICD-10-CM | POA: Diagnosis present

## 2018-01-20 DIAGNOSIS — R778 Other specified abnormalities of plasma proteins: Secondary | ICD-10-CM

## 2018-01-20 DIAGNOSIS — I248 Other forms of acute ischemic heart disease: Secondary | ICD-10-CM | POA: Diagnosis present

## 2018-01-20 DIAGNOSIS — G4700 Insomnia, unspecified: Secondary | ICD-10-CM | POA: Diagnosis present

## 2018-01-20 DIAGNOSIS — I110 Hypertensive heart disease with heart failure: Secondary | ICD-10-CM | POA: Diagnosis present

## 2018-01-20 DIAGNOSIS — J398 Other specified diseases of upper respiratory tract: Secondary | ICD-10-CM | POA: Diagnosis present

## 2018-01-20 DIAGNOSIS — J810 Acute pulmonary edema: Secondary | ICD-10-CM | POA: Diagnosis not present

## 2018-01-20 DIAGNOSIS — I5021 Acute systolic (congestive) heart failure: Secondary | ICD-10-CM | POA: Diagnosis present

## 2018-01-20 DIAGNOSIS — Z7982 Long term (current) use of aspirin: Secondary | ICD-10-CM

## 2018-01-20 DIAGNOSIS — Z7984 Long term (current) use of oral hypoglycemic drugs: Secondary | ICD-10-CM | POA: Diagnosis not present

## 2018-01-20 DIAGNOSIS — N179 Acute kidney failure, unspecified: Secondary | ICD-10-CM | POA: Diagnosis present

## 2018-01-20 DIAGNOSIS — T4275XA Adverse effect of unspecified antiepileptic and sedative-hypnotic drugs, initial encounter: Secondary | ICD-10-CM | POA: Diagnosis not present

## 2018-01-20 DIAGNOSIS — R109 Unspecified abdominal pain: Secondary | ICD-10-CM

## 2018-01-20 DIAGNOSIS — I2489 Other forms of acute ischemic heart disease: Secondary | ICD-10-CM

## 2018-01-20 DIAGNOSIS — I420 Dilated cardiomyopathy: Secondary | ICD-10-CM | POA: Diagnosis not present

## 2018-01-20 DIAGNOSIS — J9690 Respiratory failure, unspecified, unspecified whether with hypoxia or hypercapnia: Secondary | ICD-10-CM

## 2018-01-20 DIAGNOSIS — F17210 Nicotine dependence, cigarettes, uncomplicated: Secondary | ICD-10-CM | POA: Diagnosis present

## 2018-01-20 DIAGNOSIS — I10 Essential (primary) hypertension: Secondary | ICD-10-CM | POA: Diagnosis not present

## 2018-01-20 DIAGNOSIS — I340 Nonrheumatic mitral (valve) insufficiency: Secondary | ICD-10-CM

## 2018-01-20 DIAGNOSIS — J9602 Acute respiratory failure with hypercapnia: Secondary | ICD-10-CM | POA: Diagnosis present

## 2018-01-20 DIAGNOSIS — E872 Acidosis, unspecified: Secondary | ICD-10-CM

## 2018-01-20 DIAGNOSIS — E669 Obesity, unspecified: Secondary | ICD-10-CM | POA: Diagnosis present

## 2018-01-20 DIAGNOSIS — T85598A Other mechanical complication of other gastrointestinal prosthetic devices, implants and grafts, initial encounter: Secondary | ICD-10-CM | POA: Diagnosis not present

## 2018-01-20 DIAGNOSIS — E042 Nontoxic multinodular goiter: Secondary | ICD-10-CM | POA: Diagnosis present

## 2018-01-20 DIAGNOSIS — E1165 Type 2 diabetes mellitus with hyperglycemia: Secondary | ICD-10-CM | POA: Diagnosis present

## 2018-01-20 DIAGNOSIS — G4733 Obstructive sleep apnea (adult) (pediatric): Secondary | ICD-10-CM | POA: Diagnosis present

## 2018-01-20 DIAGNOSIS — R740 Nonspecific elevation of levels of transaminase and lactic acid dehydrogenase [LDH]: Secondary | ICD-10-CM | POA: Diagnosis present

## 2018-01-20 DIAGNOSIS — R7989 Other specified abnormal findings of blood chemistry: Secondary | ICD-10-CM

## 2018-01-20 DIAGNOSIS — E876 Hypokalemia: Secondary | ICD-10-CM | POA: Diagnosis present

## 2018-01-20 DIAGNOSIS — R509 Fever, unspecified: Secondary | ICD-10-CM

## 2018-01-20 DIAGNOSIS — Z0189 Encounter for other specified special examinations: Secondary | ICD-10-CM

## 2018-01-20 DIAGNOSIS — Z833 Family history of diabetes mellitus: Secondary | ICD-10-CM

## 2018-01-20 DIAGNOSIS — J9600 Acute respiratory failure, unspecified whether with hypoxia or hypercapnia: Secondary | ICD-10-CM

## 2018-01-20 LAB — COMPREHENSIVE METABOLIC PANEL
ALBUMIN: 3.6 g/dL (ref 3.5–5.0)
ALT: 36 U/L (ref 0–44)
AST: 46 U/L — AB (ref 15–41)
Alkaline Phosphatase: 62 U/L (ref 38–126)
Anion gap: 13 (ref 5–15)
BUN: 14 mg/dL (ref 6–20)
CHLORIDE: 106 mmol/L (ref 98–111)
CO2: 22 mmol/L (ref 22–32)
Calcium: 8.8 mg/dL — ABNORMAL LOW (ref 8.9–10.3)
Creatinine, Ser: 0.97 mg/dL (ref 0.61–1.24)
GFR calc Af Amer: >60 mL/min (ref >60)
GFR calc non Af Amer: >60 mL/min (ref >60)
GLUCOSE: 398 mg/dL — AB (ref 70–99)
Potassium: 3.4 mmol/L — ABNORMAL LOW (ref 3.5–5.1)
Sodium: 141 mmol/L (ref 135–145)
Total Bilirubin: 0.6 mg/dL (ref 0.3–1.2)
Total Protein: 8.4 g/dL — ABNORMAL HIGH (ref 6.5–8.1)

## 2018-01-20 LAB — PROCALCITONIN: Procalcitonin: <0.1 ng/mL

## 2018-01-20 LAB — BLOOD GAS, ARTERIAL
ACID-BASE DEFICIT: 1.2 mmol/L (ref 0.0–2.0)
Acid-base deficit: 2.2 mmol/L — ABNORMAL HIGH (ref 0.0–2.0)
BICARBONATE: 22.6 mmol/L (ref 20.0–28.0)
Bicarbonate: 25.4 mmol/L (ref 20.0–28.0)
FIO2: 100
FIO2: 1
LHR: 24 {breaths}/min
MECHVT: 550 mL
MECHVT: 550 mL
O2 SAT: 100 %
O2 Saturation: 98.7 %
PATIENT TEMPERATURE: 37
PATIENT TEMPERATURE: 37
PCO2 ART: 34 mmHg (ref 32.0–48.0)
PEEP/CPAP: 8 cmH2O
PEEP: 14 cmH2O
PH ART: 7.43 (ref 7.350–7.450)
PO2 ART: 133 mmHg — AB (ref 83.0–108.0)
PO2 ART: 395 mmHg — AB (ref 83.0–108.0)
RATE: 24 resp/min
pCO2 arterial: 54 mmHg — ABNORMAL HIGH (ref 32.0–48.0)
pH, Arterial: 7.28 — ABNORMAL LOW (ref 7.350–7.450)

## 2018-01-20 LAB — LIPID PANEL
Cholesterol: 191 mg/dL (ref 0–200)
LDL Cholesterol: UNDETERMINED mg/dL (ref 0–99)
Triglycerides: 1658 mg/dL — ABNORMAL HIGH (ref <150)
VLDL: UNDETERMINED mg/dL (ref 0–40)

## 2018-01-20 LAB — URINALYSIS, COMPLETE (UACMP) WITH MICROSCOPIC
BILIRUBIN URINE: NEGATIVE
Bacteria, UA: NONE SEEN
Glucose, UA: >=500 mg/dL — AB
KETONES UR: NEGATIVE mg/dL
LEUKOCYTES UA: NEGATIVE
Nitrite: NEGATIVE
Protein, ur: >=300 mg/dL — AB
Specific Gravity, Urine: 1.027 (ref 1.005–1.030)
Squamous Epithelial / LPF: NONE SEEN (ref 0–5)
pH: 5 (ref 5.0–8.0)

## 2018-01-20 LAB — CBC WITH DIFFERENTIAL/PLATELET
Basophils Absolute: 0 10*3/uL (ref 0–0.1)
Basophils Relative: 0 %
EOS PCT: 1 %
Eosinophils Absolute: 0.3 10*3/uL (ref 0–0.7)
HEMATOCRIT: 43.9 % (ref 40.0–52.0)
Hemoglobin: 14.7 g/dL (ref 13.0–18.0)
LYMPHS ABS: 11.5 10*3/uL — AB (ref 1.0–3.6)
Lymphocytes Relative: 42 %
MCH: 32 pg (ref 26.0–34.0)
MCHC: 33.6 g/dL (ref 32.0–36.0)
MCV: 95.2 fL (ref 80.0–100.0)
MONOS PCT: 10 %
Monocytes Absolute: 2.7 10*3/uL — ABNORMAL HIGH (ref 0.2–1.0)
NEUTROS ABS: 12.8 10*3/uL — AB (ref 1.4–6.5)
Neutrophils Relative %: 47 %
Platelets: 284 10*3/uL (ref 150–440)
RBC: 4.6 MIL/uL (ref 4.40–5.90)
RDW: 13.5 % (ref 11.5–14.5)
WBC: 27.3 10*3/uL — ABNORMAL HIGH (ref 3.8–10.6)

## 2018-01-20 LAB — TROPONIN I
TROPONIN I: 0.42 ng/mL — AB (ref <0.03)
Troponin I: 0.06 ng/mL (ref <0.03)
Troponin I: 0.21 ng/mL (ref <0.03)
Troponin I: 0.4 ng/mL (ref <0.03)
Troponin I: 0.45 ng/mL (ref <0.03)

## 2018-01-20 LAB — BLOOD GAS, VENOUS
Acid-base deficit: 9.6 mmol/L — ABNORMAL HIGH (ref 0.0–2.0)
Bicarbonate: 20.6 mmol/L (ref 20.0–28.0)
DRAWN BY: 28459
Delivery systems: POSITIVE
FIO2: 100
O2 Saturation: 89.6 %
PATIENT TEMPERATURE: 37
PCO2 VEN: 62 mmHg — AB (ref 44.0–60.0)
pH, Ven: 7.13 — CL (ref 7.250–7.430)
pO2, Ven: 76 mmHg — ABNORMAL HIGH (ref 32.0–45.0)

## 2018-01-20 LAB — LIPASE, BLOOD: LIPASE: 47 U/L (ref 11–51)

## 2018-01-20 LAB — GLUCOSE, CAPILLARY
GLUCOSE-CAPILLARY: 348 mg/dL — AB (ref 70–99)
GLUCOSE-CAPILLARY: 310 mg/dL — AB (ref 70–99)
GLUCOSE-CAPILLARY: 159 mg/dL — AB (ref 70–99)
GLUCOSE-CAPILLARY: 180 mg/dL — AB (ref 70–99)
Glucose-Capillary: 171 mg/dL — ABNORMAL HIGH (ref 70–99)

## 2018-01-20 LAB — PHOSPHORUS: PHOSPHORUS: 4.5 mg/dL (ref 2.5–4.6)

## 2018-01-20 LAB — MRSA PCR SCREENING: MRSA by PCR: POSITIVE — AB

## 2018-01-20 LAB — MAGNESIUM: MAGNESIUM: 1.9 mg/dL (ref 1.7–2.4)

## 2018-01-20 LAB — STREP PNEUMONIAE URINARY ANTIGEN: STREP PNEUMO URINARY ANTIGEN: NEGATIVE

## 2018-01-20 LAB — LACTIC ACID, PLASMA
Lactic Acid, Venous: 5.4 mmol/L (ref 0.5–1.9)
Lactic Acid, Venous: 2 mmol/L (ref 0.5–1.9)

## 2018-01-20 LAB — PROTIME-INR
INR: 0.91
Prothrombin Time: 12.2 seconds (ref 11.4–15.2)

## 2018-01-20 LAB — AMYLASE: Amylase: 91 U/L (ref 28–100)

## 2018-01-20 LAB — T4, FREE: FREE T4: 0.75 ng/dL — AB (ref 0.82–1.77)

## 2018-01-20 LAB — TSH: TSH: 4.696 u[IU]/mL — ABNORMAL HIGH (ref 0.350–4.500)

## 2018-01-20 LAB — BRAIN NATRIURETIC PEPTIDE: B Natriuretic Peptide: 593 pg/mL — ABNORMAL HIGH (ref 0.0–100.0)

## 2018-01-20 MED ORDER — LORAZEPAM 2 MG/ML IJ SOLN
INTRAMUSCULAR | Status: AC
  Administered 2018-01-20: 1 mg via INTRAVENOUS
  Filled 2018-01-20: qty 1

## 2018-01-20 MED ORDER — PHENYLEPHRINE HCL-NACL 10-0.9 MG/250ML-% IV SOLN
0.0000 ug/min | INTRAVENOUS | Status: DC
  Administered 2018-01-20: 100 ug/min via INTRAVENOUS
  Filled 2018-01-20: qty 250

## 2018-01-20 MED ORDER — ASPIRIN EC 325 MG PO TBEC: 325.0000 mg | DELAYED_RELEASE_TABLET | Freq: Once | ORAL | Status: DC

## 2018-01-20 MED ORDER — CHLORHEXIDINE GLUCONATE 0.12% ORAL RINSE (MEDLINE KIT)
15.0000 mL | Freq: Two times a day (BID) | OROMUCOSAL | Status: DC
  Administered 2018-01-20 – 2018-01-24 (×6): 15 mL via OROMUCOSAL

## 2018-01-20 MED ORDER — NITROGLYCERIN IN D5W 200-5 MCG/ML-% IV SOLN
INTRAVENOUS | Status: AC
  Administered 2018-01-20: 100 ug/min via INTRAVENOUS
  Filled 2018-01-20: qty 250

## 2018-01-20 MED ORDER — ASPIRIN 81 MG PO CHEW
81.0000 mg | CHEWABLE_TABLET | Freq: Every day | ORAL | Status: DC
  Administered 2018-01-21 – 2018-01-24 (×4): 81 mg via ORAL
  Filled 2018-01-20 (×4): qty 1

## 2018-01-20 MED ORDER — NITROGLYCERIN IN D5W 200-5 MCG/ML-% IV SOLN
0.0000 ug/min | Freq: Once | INTRAVENOUS | Status: AC
  Administered 2018-01-20: 100 ug/min via INTRAVENOUS

## 2018-01-20 MED ORDER — INSULIN ASPART 100 UNIT/ML ~~LOC~~ SOLN
0.0000 [IU] | SUBCUTANEOUS | Status: DC
  Administered 2018-01-20 – 2018-01-21 (×5): 3 [IU] via SUBCUTANEOUS
  Administered 2018-01-21: 5 [IU] via SUBCUTANEOUS
  Administered 2018-01-21 (×2): 3 [IU] via SUBCUTANEOUS
  Administered 2018-01-22 (×2): 5 [IU] via SUBCUTANEOUS
  Administered 2018-01-22: 2 [IU] via SUBCUTANEOUS
  Administered 2018-01-22 (×2): 3 [IU] via SUBCUTANEOUS
  Administered 2018-01-22 – 2018-01-23 (×4): 2 [IU] via SUBCUTANEOUS
  Filled 2018-01-20 (×18): qty 1

## 2018-01-20 MED ORDER — FUROSEMIDE 10 MG/ML IJ SOLN
INTRAMUSCULAR | Status: AC
Start: 2018-01-20 — End: 2018-01-20
  Administered 2018-01-20: 40 mg via INTRAVENOUS
  Filled 2018-01-20: qty 4

## 2018-01-20 MED ORDER — ASPIRIN EC 81 MG PO TBEC: 81.0000 mg | DELAYED_RELEASE_TABLET | Freq: Every day | ORAL | Status: DC

## 2018-01-20 MED ORDER — FENTANYL 2500MCG IN NS 250ML (10MCG/ML) PREMIX INFUSION
0.0000 ug/h | INTRAVENOUS | Status: DC
Start: 2018-01-20 — End: 2018-01-21
  Administered 2018-01-20: 200 ug/h via INTRAVENOUS
  Administered 2018-01-20: 150 ug/h via INTRAVENOUS
  Filled 2018-01-20 (×2): qty 250

## 2018-01-20 MED ORDER — KETAMINE HCL 10 MG/ML IJ SOLN
200.0000 mg | Freq: Once | INTRAMUSCULAR | Status: AC
  Administered 2018-01-20: 200 mg via INTRAVENOUS

## 2018-01-20 MED ORDER — POTASSIUM CHLORIDE IN NACL 20-0.45 MEQ/L-% IV SOLN
INTRAVENOUS | Status: DC
  Filled 2018-01-20 (×2): qty 1000

## 2018-01-20 MED ORDER — FENTANYL CITRATE (PF) 100 MCG/2ML IJ SOLN: 12.5000 ug | INTRAMUSCULAR | Status: DC | PRN

## 2018-01-20 MED ORDER — MIDAZOLAM HCL 2 MG/2ML IJ SOLN: 2.0000 mg | Freq: Once | INTRAMUSCULAR | Status: DC

## 2018-01-20 MED ORDER — ATORVASTATIN CALCIUM 20 MG PO TABS
40.0000 mg | ORAL_TABLET | Freq: Every day | ORAL | Status: DC
  Administered 2018-01-21 – 2018-01-23 (×3): 40 mg via ORAL
  Filled 2018-01-20 (×3): qty 2

## 2018-01-20 MED ORDER — BISACODYL 5 MG PO TBEC
5.0000 mg | DELAYED_RELEASE_TABLET | Freq: Every day | ORAL | Status: DC | PRN
  Administered 2018-01-23: 5 mg via ORAL
  Filled 2018-01-20: qty 1

## 2018-01-20 MED ORDER — CEFEPIME HCL 2 G IJ SOLR
2.0000 g | Freq: Once | INTRAMUSCULAR | Status: AC
  Administered 2018-01-20: 2 g via INTRAVENOUS
  Filled 2018-01-20: qty 2

## 2018-01-20 MED ORDER — MAGNESIUM SULFATE 2 GM/50ML IV SOLN
2.0000 g | Freq: Once | INTRAVENOUS | Status: AC
  Administered 2018-01-20: 2 g via INTRAVENOUS
  Filled 2018-01-20: qty 50

## 2018-01-20 MED ORDER — MIDAZOLAM HCL 2 MG/2ML IJ SOLN
INTRAMUSCULAR | Status: AC
  Administered 2018-01-20: 05:00:00
  Filled 2018-01-20: qty 2

## 2018-01-20 MED ORDER — FAMOTIDINE IN NACL 20-0.9 MG/50ML-% IV SOLN
20.0000 mg | Freq: Two times a day (BID) | INTRAVENOUS | Status: DC
  Administered 2018-01-20 – 2018-01-22 (×5): 20 mg via INTRAVENOUS
  Filled 2018-01-20 (×5): qty 50

## 2018-01-20 MED ORDER — ENOXAPARIN SODIUM 120 MG/0.8ML ~~LOC~~ SOLN
1.0000 mg/kg | Freq: Two times a day (BID) | SUBCUTANEOUS | Status: DC
  Administered 2018-01-20 – 2018-01-23 (×6): 115 mg via SUBCUTANEOUS
  Filled 2018-01-20 (×7): qty 0.8

## 2018-01-20 MED ORDER — SODIUM CHLORIDE 0.9 % IV SOLN
2.0000 g | Freq: Three times a day (TID) | INTRAVENOUS | Status: DC
  Administered 2018-01-20 – 2018-01-24 (×13): 2 g via INTRAVENOUS
  Filled 2018-01-20 (×16): qty 2

## 2018-01-20 MED ORDER — SODIUM CHLORIDE 0.9 % IV SOLN: 250.0000 mL | INTRAVENOUS | Status: DC | PRN

## 2018-01-20 MED ORDER — POTASSIUM CHLORIDE 10 MEQ/50ML IV SOLN
10.0000 meq | INTRAVENOUS | Status: DC | PRN
  Administered 2018-01-20: 10 meq via INTRAVENOUS
  Filled 2018-01-20: qty 50

## 2018-01-20 MED ORDER — INSULIN ASPART 100 UNIT/ML ~~LOC~~ SOLN: 0.0000 [IU] | Freq: Every day | SUBCUTANEOUS | Status: DC

## 2018-01-20 MED ORDER — SODIUM CHLORIDE 0.9 % IV SOLN
0.0000 ug/min | INTRAVENOUS | Status: DC
  Administered 2018-01-20: 130 ug/min via INTRAVENOUS
  Filled 2018-01-20: qty 4

## 2018-01-20 MED ORDER — MUPIROCIN 2 % EX OINT
1.0000 "application " | TOPICAL_OINTMENT | Freq: Two times a day (BID) | CUTANEOUS | Status: DC
  Administered 2018-01-20 – 2018-01-24 (×9): 1 via NASAL
  Filled 2018-01-20: qty 22

## 2018-01-20 MED ORDER — VANCOMYCIN HCL IN DEXTROSE 1-5 GM/200ML-% IV SOLN
1000.0000 mg | Freq: Three times a day (TID) | INTRAVENOUS | Status: DC
  Administered 2018-01-20 – 2018-01-21 (×4): 1000 mg via INTRAVENOUS
  Filled 2018-01-20 (×5): qty 200

## 2018-01-20 MED ORDER — PROPOFOL 1000 MG/100ML IV EMUL
INTRAVENOUS | Status: AC
  Administered 2018-01-20: 10 ug/kg/min via INTRAVENOUS
  Filled 2018-01-20: qty 100

## 2018-01-20 MED ORDER — SENNOSIDES-DOCUSATE SODIUM 8.6-50 MG PO TABS: 1.0000 | ORAL_TABLET | Freq: Every evening | ORAL | Status: DC | PRN

## 2018-01-20 MED ORDER — FUROSEMIDE 10 MG/ML IJ SOLN
40.0000 mg | Freq: Once | INTRAMUSCULAR | Status: AC
  Administered 2018-01-20: 40 mg via INTRAVENOUS
  Filled 2018-01-20: qty 4

## 2018-01-20 MED ORDER — ENOXAPARIN SODIUM 80 MG/0.8ML ~~LOC~~ SOLN
75.0000 mg | Freq: Once | SUBCUTANEOUS | Status: AC
  Administered 2018-01-20: 75 mg via SUBCUTANEOUS
  Filled 2018-01-20: qty 0.8

## 2018-01-20 MED ORDER — ORAL CARE MOUTH RINSE
15.0000 mL | OROMUCOSAL | Status: DC
  Administered 2018-01-20 – 2018-01-21 (×11): 15 mL via OROMUCOSAL

## 2018-01-20 MED ORDER — INSULIN ASPART 100 UNIT/ML ~~LOC~~ SOLN
0.0000 [IU] | Freq: Three times a day (TID) | SUBCUTANEOUS | Status: DC
  Administered 2018-01-20: 11 [IU] via SUBCUTANEOUS
  Filled 2018-01-20: qty 1

## 2018-01-20 MED ORDER — LORAZEPAM 2 MG/ML IJ SOLN
1.0000 mg | Freq: Once | INTRAMUSCULAR | Status: AC
  Administered 2018-01-20: 1 mg via INTRAVENOUS

## 2018-01-20 MED ORDER — VANCOMYCIN HCL IN DEXTROSE 1-5 GM/200ML-% IV SOLN
1000.0000 mg | Freq: Once | INTRAVENOUS | Status: AC
  Administered 2018-01-20: 1000 mg via INTRAVENOUS
  Filled 2018-01-20 (×2): qty 200

## 2018-01-20 MED ORDER — DEXMEDETOMIDINE HCL IN NACL 400 MCG/100ML IV SOLN
0.4000 ug/kg/h | INTRAVENOUS | Status: DC
  Administered 2018-01-20 (×3): 0.8 ug/kg/h via INTRAVENOUS
  Administered 2018-01-20: 1 ug/kg/h via INTRAVENOUS
  Administered 2018-01-21: 1.1 ug/kg/h via INTRAVENOUS
  Administered 2018-01-21 (×2): 1 ug/kg/h via INTRAVENOUS
  Administered 2018-01-21 (×2): 1.2 ug/kg/h via INTRAVENOUS
  Filled 2018-01-20 (×6): qty 100

## 2018-01-20 MED ORDER — ONDANSETRON HCL 4 MG/2ML IJ SOLN: 4.0000 mg | Freq: Four times a day (QID) | INTRAMUSCULAR | Status: DC | PRN

## 2018-01-20 MED ORDER — PROPOFOL 1000 MG/100ML IV EMUL
5.0000 ug/kg/min | Freq: Once | INTRAVENOUS | Status: AC
  Administered 2018-01-20: 10 ug/kg/min via INTRAVENOUS

## 2018-01-20 MED ORDER — ROCURONIUM BROMIDE 50 MG/5ML IV SOLN
100.0000 mg | Freq: Once | INTRAVENOUS | Status: AC
  Administered 2018-01-20: 100 mg via INTRAVENOUS

## 2018-01-20 MED ORDER — ACETAMINOPHEN 325 MG PO TABS: 650.0000 mg | ORAL_TABLET | Freq: Four times a day (QID) | ORAL | Status: DC | PRN

## 2018-01-20 MED ORDER — PROPOFOL 1000 MG/100ML IV EMUL
5.0000 ug/kg/min | INTRAVENOUS | Status: DC
  Administered 2018-01-20: 20 ug/kg/min via INTRAVENOUS
  Filled 2018-01-20: qty 100

## 2018-01-20 MED ORDER — INSULIN GLARGINE 100 UNIT/ML ~~LOC~~ SOLN
10.0000 [IU] | Freq: Every day | SUBCUTANEOUS | Status: DC
  Administered 2018-01-20 – 2018-01-24 (×5): 10 [IU] via SUBCUTANEOUS
  Filled 2018-01-20 (×6): qty 0.1

## 2018-01-20 MED ORDER — LOSARTAN POTASSIUM 50 MG PO TABS: 25.0000 mg | ORAL_TABLET | Freq: Every day | ORAL | Status: DC

## 2018-01-20 MED ORDER — FAMOTIDINE IN NACL 20-0.9 MG/50ML-% IV SOLN
20.0000 mg | INTRAVENOUS | Status: DC
  Administered 2018-01-20: 20 mg via INTRAVENOUS
  Filled 2018-01-20: qty 50

## 2018-01-20 MED ORDER — CHLORHEXIDINE GLUCONATE CLOTH 2 % EX PADS
6.0000 | MEDICATED_PAD | Freq: Every day | CUTANEOUS | Status: DC
  Administered 2018-01-20 – 2018-01-23 (×3): 6 via TOPICAL

## 2018-01-20 MED ORDER — FUROSEMIDE 10 MG/ML IJ SOLN
40.0000 mg | Freq: Once | INTRAMUSCULAR | Status: AC
Start: 2018-01-20 — End: 2018-01-20
  Administered 2018-01-20: 40 mg via INTRAVENOUS

## 2018-01-20 MED ORDER — ENOXAPARIN SODIUM 40 MG/0.4ML ~~LOC~~ SOLN
40.0000 mg | SUBCUTANEOUS | Status: DC
  Administered 2018-01-20: 40 mg via SUBCUTANEOUS
  Filled 2018-01-20: qty 0.4

## 2018-01-20 NOTE — Progress Notes (Signed)
NG OG could not be passed by IR Will get GI opinion If doing well plan SBT in am  [place] [facility]

## 2018-01-20 NOTE — Progress Notes (Signed)
CODE SEPSIS - PHARMACY COMMUNICATION  **Broad Spectrum Antibiotics should be administered within 1 hour of Sepsis diagnosis**  Time Code Sepsis Called/Page Received: 0324  Antibiotics Ordered: vanc/cefepime  Time of 1st antibiotic administration: 0344  Additional action taken by pharmacy:   If necessary, Name of Provider/Nurse Contacted:     Tobie Lords ,PharmD Clinical Pharmacist  01/20/2018  3:47 AM

## 2018-01-20 NOTE — Consult Note (Signed)
[name], [name] [id number] [birth year] [name], MD  Reason for Consult: Thyroid nodule  HPI: 57 year old gentleman was admitted overnight through the emergency department for severe shortness of breath, anxiety, and hypertension.  Due to his airway compromise he was intubated in the ED.  A CT was obtained that showed a left thyroid nodule with some mild tracheal shift as well as bilateral pleural effusions and atelectasis.  ENT was consulted to see if the thyroid nodule may be causing airway compromise.  Patient is intubated unable to speak but what I was able to shake his head to say that nodule is in the process of being worked up.  Allergies:  Allergies  Allergen Reactions  . Enalapril Maleate Anaphylaxis  . Iodinated Diagnostic Agents Itching  . Isosorbide Mononitrate Er [Isosorbide Dinitrate] Other (See Comments)    "made me not be able to think or function"  . Canagliflozin Other (See Comments)    ROS: Review of systems normal other than 12 systems except per HPI.  PMH:  Past Medical History:  Diagnosis Date  . Chest pain 2019  . Diabetes mellitus without complication ([place])   . Hypertension   . Sleep apnea     FH:  Family History  Problem Relation Age of Onset  . Diabetes Father   . Colon cancer Neg Hx     SH:  Social History   Socioeconomic History  . Marital status: Married    Spouse name: Not on file  . Number of children: Not on file  . Years of education: Not on file  . Highest education level: Not on file  Occupational History  . Not on file  Social Needs  . Financial resource strain: Not on file  . Food insecurity:    Worry: Not on file    Inability: Not on file  . Transportation needs:    Medical: Not on file    Non-medical: Not on file  Tobacco Use  . Smoking status: Current Every Day Smoker    Packs/day: 0.30    Years: 32.00    Pack years: 9.60    Types: Cigarettes  . Smokeless tobacco: Never Used  Substance and Sexual Activity  .  Alcohol use: No  . Drug use: Never  . Sexual activity: Not on file  Lifestyle  . Physical activity:    Days per week: Not on file    Minutes per session: Not on file  . Stress: Not on file  Relationships  . Social connections:    Talks on phone: Not on file    Gets together: Not on file    Attends religious service: Not on file    Active member of club or organization: Not on file    Attends meetings of clubs or organizations: Not on file    Relationship status: Not on file  . Intimate partner violence:    Fear of current or ex partner: Not on file    Emotionally abused: Not on file    Physically abused: Not on file    Forced sexual activity: Not on file  Other Topics Concern  . Not on file  Social History Narrative  . Not on file    PSH:  Past Surgical History:  Procedure Laterality Date  . COLONOSCOPY  2017  . LIPOMA EXCISION     Left forehead    Physical  Exam: CN 2-12 grossly intact and symmetric. Marland Kitchen  Patient was intubated oral cavity was not examined.  Skin warm and dry.  Nasal cavity without polyps or purulence. External nose and ears without masses or lesions. EOMI, PERRLA. Neck supple with no masses or lesions.  In this current position I could not palpate any significant thyroid masses.  No lymphadenopathy palpated.  I reviewed the CT in detail there is approximately 5.6 cm nodule in the left lobe of the thyroid with some mild tracheal deviation in the region of the trachea just below the cricoid cartilage.  The deviation shift of the larynx in the upper region of the airway I believe is due to the positioning of the endotracheal tube which comes out the right side of the oropharynx and the right side of the mouth pulling the larynx over to the right.  I do not believe that this laryngeal shift is related at all to the thyroid nodule which is well below this area.   A/P: Thyroid nodule this patient has been seen by Dr. Honor Junes in endocrinology I have spoken with  their office and outside ultrasound was done on June 20 that showed a left thyroid nodule measured actually 5.8 x 4 cm which is consistent with this nodule noted on the CT scan.  Is likely this nodule has been present for many years and I do not think that the acute airway compromise due to this mild tracheal shift from the nodule.  This nodule is being followed by Dr. Honor Junes and  according to their office they are in the process of trying to schedule a fine-needle biopsy as an outpatient.  I think it is much more likely the airway compromise due to the pulmonary effusions atelectasis and severe hypertension.  In my opinion from an ENT standpoint there is no reason he cannot be extubated once his lung issues and hypertension are corrected.  Having further questions feel free to contact me.  ICU consult approximately 45 minutes involved with exam and chart review   Roena Malady 01/20/2018 1:17 PM

## 2018-01-20 NOTE — Progress Notes (Signed)
-  Nursing could not pass NG/OG/Dobbhoff tube - Patient has signal thyroid swelling which might be contributing to difficulty passing the tube -We will ask IR   -I also spoke with patient's wife and updated on patient's condition prognosis and management plan.  Daris Aristizabal [facility]

## 2018-01-20 NOTE — Consult Note (Signed)
PULMONARY / CRITICAL CARE MEDICINE   Name: YOLANDA DOCKENDORF MRN: [id number] DOB: [birth year]    ADMISSION DATE:  01/20/2018 CONSULTATION DATE: 01/20/2018  REFERRING MD: Dr. Jodell Cipro   CHIEF COMPLAINT: Shortness of Breath   HISTORY OF PRESENT ILLNESS:   This is a 57 yo male with a PMH of OSA, HTN, Diabetes Mellitus, Multinodular Goiter, Chest Pain, and Current Everyday Smoker.  He presented to [facility] ER on 07/19 via EMS with shortness of breath, bilateral lower extremity edema, orthopnea, non-productive cough, and insomnia onset of symptoms 2 days prior to presentation. According to pts wife he has also had poor po intake for 1 month requiring hospitalization for treatment of pancreatitis.  EMS reported when they arrived at pts home they were unable to obtain an O2 sat and systolic bp greater than 409.  Per ER notes upon arrival to the ER pt gasping, severely diaphoretic, heart rate 140's,  O2 sats 60%, and pt shouting he could not breath.  He also had brief run of ventricular tachycardia, zoll pads applied.  He was subsequently placed on Bipap to pre oxygenate prior to intubation, nitroglycerin drip initiated at 100 mcg/min, and pt received iv ativan for agitation. CXR revealed deviated trachea to the right secondary to known left thyromegaly, pulmonary edema and possible pneumonia.  Despite treatment pts respiratory failure did not improve requiring mechanical intubation.  During intubation pt required frequent suction due to copious secretions.  Lab results revealed K+ 3.4, glucose 398, AST 46, BNP 593, troponin 0.06, lactic acid 5.4, wbc 27.3, and abg pH 7.13, pCO2 62, PO2 76, acid-base deficit 9.6, and bicarb 20.6. He received 40 mg iv lasix x1 dose and sepsis protocol initiated he received iv abx, however unable to administer iv fluids due to flash pulmonary edema. He was subsequently admitted to ICU by hospitalist team for further workup and treatment PCCM consulted for vent management.   During  previous hospitalization 12/2017 CXR showed tracheal deviation suggesting thyroid goiter, therefore CT Chest ordered revealing thyroid nodule. Following hospitalization pt followed up with his PCP who ordered an US Neck confirming multinodular goiter on 12/22/17  (right lobe 5.1  x 2.0 x 2.1 cm and left lobe 6.2 x .3.7 cm). He was referred to Endocrinology for evaluation initial office visit 01/09/2018 with recommendation of fine needle aspiration biopsy scheduled on 02/08/18, if malignant pt would undergo surgery to remove the nodules. He also endorsed nocturnal dyspnea, therefore per endocrinologist if biopsy results benign pt to undergo breathing test to determine if nodules were contributing factor, if so pt would require surgery for nodule removal.  PAST MEDICAL HISTORY :  He  has a past medical history of Chest pain (2019), Diabetes mellitus without complication ([place]), Hypertension, and Sleep apnea.  PAST SURGICAL HISTORY: He  has a past surgical history that includes Lipoma excision and Colonoscopy (2017).  Allergies  Allergen Reactions  . Enalapril Maleate Anaphylaxis  . Iodinated Diagnostic Agents Itching  . Isosorbide Mononitrate Er [Isosorbide Dinitrate] Other (See Comments)    "made me not be able to think or function"  . Canagliflozin Other (See Comments)    No current facility-administered medications on file prior to encounter.    Current Outpatient Medications on File Prior to Encounter  Medication Sig  . aspirin EC 81 MG tablet Take 81 mg by mouth daily.  Marland Kitchen HYDROcodone-acetaminophen (NORCO/VICODIN) 5-325 MG tablet Take 1 tablet by mouth every 4 (four) hours as needed.  Marland Kitchen losartan (COZAAR) 25 MG tablet Take 25 mg  by mouth daily.  . metFORMIN (GLUCOPHAGE-XR) 500 MG 24 hr tablet Take 2,000 mg by mouth daily.   . polyethylene glycol powder (GLYCOLAX/MIRALAX) powder Take 17 g by mouth daily as needed for moderate constipation.  . saxagliptin HCl (ONGLYZA) 5 MG TABS tablet Take 5  mg by mouth daily.     FAMILY HISTORY:  His family history includes Diabetes in his father. There is no history of Colon cancer.  SOCIAL HISTORY: He  reports that he has been smoking cigarettes.  He has a 9.60 pack-year smoking history. He has never used smokeless tobacco. He reports that he does not drink alcohol or use drugs.  REVIEW OF SYSTEMS:   Unable to assess pt intubated   SUBJECTIVE:  Unable to assess pt intubated  VITAL SIGNS: BP 138/80   Pulse (!) 135   Temp (!) 100.9 F (38.3 C) (Rectal)   Resp (!) 24   Ht _0  (1.778 m)   Wt 113.4 kg (250 lb)   SpO2 98%   BMI 35.87 kg/m   HEMODYNAMICS:    VENTILATOR SETTINGS: Vent Mode: AC FiO2 (%):  [100 %] 100 % Set Rate:  [24 bmp] 24 bmp Vt Set:  [550 mL] 550 mL PEEP:  [14 cmH20] 14 cmH20  INTAKE / OUTPUT: No intake/output data recorded.  PHYSICAL EXAMINATION: General: acutely ill appearing male, NAD mechanically intubated  Neuro: agitated, opens eyes to voice, PERRL HEENT: mild JVD  Cardiovascular: sinus tach, no R/G Lungs: diffuse crackles throughout, slightly tachypneic  Abdomen: +BS x4, obese, soft, non distended  Musculoskeletal: normal bulk and tone Skin: intact no lesions or rashes present   LABS:  BMET Recent Labs  Lab 01/20/18 0231  NA 141  K 3.4*  CL 106  CO2 22  BUN 14  CREATININE 0.97  GLUCOSE 398*    Electrolytes Recent Labs  Lab 01/20/18 0231  CALCIUM 8.8*    CBC Recent Labs  Lab 01/20/18 0231  WBC 27.3*  HGB 14.7  HCT 43.9  PLT 284    Coag's Recent Labs  Lab 01/20/18 0231  INR 0.91    Sepsis Markers Recent Labs  Lab 01/20/18 0231  LATICACIDVEN 5.4*    ABG No results for input(s): PHART, PCO2ART, PO2ART in the last 168 hours.  Liver Enzymes Recent Labs  Lab 01/20/18 0231  AST 46*  ALT 36  ALKPHOS 62  BILITOT 0.6  ALBUMIN 3.6    Cardiac Enzymes Recent Labs  Lab 01/20/18 0231  TROPONINI 0.06*    Glucose No results for input(s): GLUCAP in  the last 168 hours.  Imaging Dg Chest Portable 1 View  Result Date: 01/20/2018 CLINICAL DATA:  57 y/o  M; status post intubation. EXAM: PORTABLE CHEST 1 VIEW COMPARISON:  01/20/2018 chest radiograph FINDINGS: Stable cardiomegaly given projection and technique. Interstitial and alveolar pulmonary edema. Probable small effusions. Endotracheal tube tip 4.5 cm above the carina. Transcutaneous pacing pads noted. Bones are unremarkable. IMPRESSION: 1. Endotracheal tube tip 4.5 cm above the carina. 2. Stable cardiomegaly and pulmonary edema. Probable small effusions. Electronically Signed   By: Kristine Garbe M.D.   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 Stable cardiomegaly. Nonaneurysmal thoracic aorta. Diffuse bilateral airspace disease and vascular congestion consistent with pulmonary edema. No significant effusion or pneumothorax. No acute osseous abnormality. IMPRESSION: Stable cardiomegaly  with pulmonary edema. Superimposed pneumonia is not entirely excluded but believed less likely. Electronically Signed   By: Ashley Royalty M.D.   On: 01/20/2018 02:56   STUDIES:  CT Soft Tissue Neck 07/19>>Thyroid nodules measuring up to 5.6 cm in the left lobe of thyroid. Mass effect from the large left lobe of thyroid nodule displaces the airway rightward. Endotracheal tube tip 3.4 cm above the carina. Moderate bilateral pleural effusion with dependent atelectasis of the lungs. CT Chest High Resolution 07/19>>  CULTURES/TEST: Sputum 07/19>> Blood x2 07/19>> Urine 07/19>> MRSA PCR 07/19>> Strep pneumoniae urinary antigen 07/19>> Legionella pneumophilia serogp UR Ag 07/19>>  ANTIBIOTICS: Cefepime 07/19>> Vancomycin 07/19>>  SIGNIFICANT EVENTS: 07/19 Pt admitted to ICU mechanically intubated    LINES/TUBES: ETT 07/19>> Right femoral CVL 07/19>>  ASSESSMENT / PLAN:  PULMONARY A: Acute hypoxic hypercapnic respiratory failure secondary to pulmonary edema and possible pneumonia  Multinodular goiter with tracheal deviation  Mechanical Intubation  Hx: OSA and Current Everyday Smoker P:   Full vent support for now-vent setting reviewed and established SBT once all parameters met  Scheduled and prn bronchodilator therapy VAP bundle implemented  Repeat ABG  CARDIOVASCULAR A:  Sinus tachycardia Hypertension-resolved  Hypotension secondary to sedation and possible sepsis  Elevated troponin likely secondary to demand ischemia in setting of respiratory failure  P:  Continuous telemetry monitoring Echo pending  Trend troponin's Discontinue nitroglycerin gtt Prn neo-synephrine to maintain map >65  RENAL A:   Hypokalemia  Lactic acidosis  P:   Trend BMP and lactic acid  Replace electrolytes as indicated  Monitor UOP  GASTROINTESTINAL A:   Abdominal pain Transaminasemia (AST 46) P:   Keep NPO for now SUP px: iv pepcid Trend amylase and lipase  KUB pending  Unable to place orogastric tube due to tracheal deviation   HEMATOLOGIC A:   No acute issues  P:  VTE px: subq lovenox Trend CBC  Monitor for s/sx of bleeding  Transfuse for hgb <7  INFECTIOUS A:   Possible Pneumonia  P:   Trend WBC and monitor fever curve Trend PCT and lactic acid Follow cultures  Continue abx for now   ENDOCRINE A:   Multinodular goiter with tracheal deviation  Possible thyrotoxicosis  Diabetes Mellitus  P:   TSH, FT4, and FT3 pending  SSI if CBG's remain elevated will need insulin gtt   NEUROLOGIC A:   Mechanical intubation discomfort/pain P:   RASS goal: 0 to -1 Propfofol and fentanyl gtts to maintain RASS goal  WUA daily    FAMILY  - Updates: Pts wife updated regarding plan of care and all questions answered 01/20/18  - Case discussed with [facility] physician  Dr. Waunita Schooner acute respiratory failure likely secondary to flash pulmonary edema although pt has tracheal deviation secondary to multinodular goiter previously diagnosed on 12/22/2017.  Due to lactic acidosis concern for possible sepsis PCT negative, due to pulmonary edema will hold off on iv fluids for now. Discussed concerns of thyrotoxicosis with Dr. Waunita Schooner he agrees with current lab workup. Will continue to monitor and assess pt.    Marda Stalker, [place] Pager [phone] (please enter 7 digits) PCCM Consult Pager [phone] (please enter 7 digits)

## 2018-01-20 NOTE — Progress Notes (Signed)
*  PRELIMINARY RESULTS* Echocardiogram 2D Echocardiogram has been performed.  Sherrie Sport 01/20/2018, 1:48 PM

## 2018-01-20 NOTE — ED Notes (Signed)
Pt with run of vtach, EDP is in rm

## 2018-01-20 NOTE — Procedures (Signed)
Central Venous Catheter Insertion Procedure Note ISAIR INABINET [id number] [birth year]  Procedure: Insertion of Central Venous Catheter Indications: Assessment of intravascular volume, Drug and/or fluid administration and Frequent blood sampling  Procedure Details Consent: Risks of procedure as well as the alternatives and risks of each were explained to the (patient/caregiver).  Consent for procedure obtained. Time Out: Verified patient identification, verified procedure, site/side was marked, verified correct patient position, special equipment/implants available, medications/allergies/relevent history reviewed, required imaging and test results available.  Performed  Maximum sterile technique was used including antiseptics, cap, gloves, gown, hand hygiene, mask and sheet. Skin prep: Chlorhexidine; local anesthetic administered A antimicrobial bonded/coated triple lumen catheter was placed in the right femoral vein due to emergent situation using the Seldinger technique.  Evaluation Blood flow good Complications: No apparent complications Patient did tolerate procedure well. Chest X-ray ordered to verify placement.  CXR: not indicated .  Right femoral CVL placed utilizing ultrasound no complications noted during or following procedure.  Did not place internal jugular CVL due to multinodular goiter  Marda Stalker, [place] Pager [phone] (please enter 7 digits) PCCM Consult Pager [phone] (please enter 7 digits)

## 2018-01-20 NOTE — Progress Notes (Signed)
Inpatient Diabetes Program Recommendations  AACE/ADA: New Consensus Statement on Inpatient Glycemic Control (2019)  Target Ranges:  Prepandial:   less than 140 mg/dL      Peak postprandial:   less than 180 mg/dL (1-2 hours)      Critically ill patients:  140 - 180 mg/dL   Results for [name], [name] (MRN [id number]) as of 01/20/2018 09:11  Ref. Range 01/20/2018 04:39 01/20/2018 07:11  Glucose-Capillary Latest Ref Range: 70 - 99 mg/dL 348 (H) 310 (H)   Review of Glycemic Control  Diabetes history: DM2 Outpatient Diabetes medications: Metformin XR 2000 mg daily, Onglyza 5 mg daily Current orders for Inpatient glycemic control: Novolog 0-15 units TID with meals, Novolog 0-5 units QHS  Inpatient Diabetes Program Recommendations:  Insulin - Basal: Please consider ordering Lantus 15 units Q24H. Correction (SSI): Please consider discontinuing current Novolog order and use ICU Glycemic Control order set and order CBGs and Novolog correction Q4H. HgbA1C: Please consider ordering an A1C to evaluate glycemic control over the past 2-3 months.  Thanks, Barnie Alderman, RN, MSN, CDE Diabetes Coordinator Inpatient Diabetes Program [phone] (Team Pager from 8am to 5pm)

## 2018-01-20 NOTE — H&P (Addendum)
[facility] NAME: [name]    MR#:  [id number]  DATE OF BIRTH:  [birth year]  DATE OF ADMISSION:  01/20/2018  PRIMARY CARE PHYSICIAN: Marguerita Merles, MD   REQUESTING/REFERRING PHYSICIAN: Hinda Kehr, MD  CHIEF COMPLAINT:   Chief Complaint  Patient presents with  . Respiratory Distress    HISTORY OF PRESENT ILLNESS:  [name]  is a 57 y.o. male with a known history of obesity, T2NIDDM, HTN p/w respiratory distress, acute hypoxemic respiratory failure. Pt intubated after arriving to ED, sedated at the time of my assessment, Hx/ROS unobtainable from pt. Hx obtained from pt's wife at bedside. ~47mo ago, pt had been suffering from poor PO intake, early satiety, SOB and chest/abdominal discomfort, found to have pancreatitis. He was eventually discharged from the hospital, but continued to have poor PO intake after. In the interrim period, he had a thyroid U/S (+) nodule. Pt was pending Bx of this nodule. Pt's wife states he has been having non-productive cough, SOB, orthopnea, PND and insomnia for the past 3-4 days/nights, progressively worsening. She has also noticed intermittent leg swelling, which she states was initially unilateral RLE, then unilateral LLE, and she says it did not resolve w/ recumbence (though he does not exhibit pretibial pitting edema on physical examination). On 01/19/2018 evening, pt became severely SOB right before bedtime. Pt's wife called EMS. Pt did not report CP, AP, N/V. He is a smoker, 1ppd, duration unknown (at least 33yrs). (-) EtOH/illicit drugs, (-) pets at home, (-) recent travel, (-) sick contacts. Works for [employer] at [employer]. (+) recent hospitalization ~74mo ago, as discussed above.  PAST MEDICAL HISTORY:   Past Medical History:  Diagnosis Date  . Chest pain 2019  . Diabetes mellitus without complication ([place])   . Hypertension   . Sleep apnea     PAST SURGICAL HISTORY:    Past Surgical History:  Procedure Laterality Date  . COLONOSCOPY  2017  . LIPOMA EXCISION     Left forehead    SOCIAL HISTORY:   Social History   Tobacco Use  . Smoking status: Current Every Day Smoker    Packs/day: 0.30    Years: 32.00    Pack years: 9.60    Types: Cigarettes  . Smokeless tobacco: Never Used  Substance Use Topics  . Alcohol use: No    FAMILY HISTORY:   Family History  Problem Relation Age of Onset  . Diabetes Father   . Colon cancer Neg Hx     DRUG ALLERGIES:   Allergies  Allergen Reactions  . Enalapril Maleate Anaphylaxis  . Iodinated Diagnostic Agents Itching  . Isosorbide Mononitrate Er [Isosorbide Dinitrate] Other (See Comments)    "made me not be able to think or function"  . Canagliflozin Other (See Comments)    REVIEW OF SYSTEMS:   Review of Systems  Unable to perform ROS: Intubated  Respiratory: Positive for cough and shortness of breath.   Cardiovascular: Positive for orthopnea, leg swelling and PND. Negative for chest pain.  Gastrointestinal: Negative for abdominal pain, nausea and vomiting.   MEDICATIONS AT HOME:   Prior to Admission medications   Medication Sig Start Date End Date Taking? Authorizing Provider  aspirin EC 81 MG tablet Take 81 mg by mouth daily.   Yes [provider]  HYDROcodone-acetaminophen (NORCO/VICODIN) 5-325 MG tablet Take 1 tablet by mouth every 4 (four) hours as needed. 12/13/17   Harvest Dark, MD  losartan (COZAAR) 25 MG tablet Take 25 mg by mouth daily. 08/26/16   [provider]  metFORMIN (GLUCOPHAGE-XR) 500 MG 24 hr tablet Take 2,000 mg by mouth daily.     [provider]  polyethylene glycol powder (GLYCOLAX/MIRALAX) powder Take 17 g by mouth daily as needed for moderate constipation. 12/13/17   Harvest Dark, MD  saxagliptin HCl (ONGLYZA) 5 MG TABS tablet Take 5 mg by mouth daily.     [provider]      VITAL SIGNS:  Blood pressure 138/80, pulse  (!) 135, temperature (!) 100.9 F (38.3 C), temperature source Rectal, resp. rate (!) 24, height 5\' 10"  (1.778 m), weight 113.4 kg (250 lb), SpO2 98 %.  PHYSICAL EXAMINATION:  Physical Exam  Constitutional: He appears well-developed and well-nourished.  Non-toxic appearance. He does not have a sickly appearance. He is sedated and intubated.  HENT:  Head: Atraumatic.  Eyes: Conjunctivae and lids are normal. No scleral icterus.  Neck: Neck supple. No thyromegaly present.  Cardiovascular: Regular rhythm, S1 normal and S2 normal.  Occasional extrasystoles are present. Tachycardia present. Exam reveals no gallop, no S3, no S4, no distant heart sounds and no friction rub.  No murmur heard. HR 140s during H&P/exam.  Pulmonary/Chest: No stridor. He is intubated. He has decreased breath sounds in the right upper field, the right middle field, the right lower field, the left upper field, the left middle field and the left lower field. He has no wheezes. He has no rhonchi. He has rales in the right upper field, the right middle field, the right lower field, the left upper field, the left middle field and the left lower field.  Diffusely diminished, diffuse fine crackles B/L, (-) rhonchi/wheezing.  Abdominal: Soft. Bowel sounds are normal. He exhibits no distension. There is no tenderness. There is no rebound and no guarding.  Musculoskeletal: He exhibits no edema.  Lymphadenopathy:    He has no cervical adenopathy.  Neurological: He is unresponsive.  Intubated/sedated.  Skin: Skin is warm, dry and intact. No rash noted. He is not diaphoretic. No erythema.  Psychiatric:  Intubated/sedated.   LABORATORY PANEL:   CBC Recent Labs  Lab 01/20/18 0231  WBC 27.3*  HGB 14.7  HCT 43.9  PLT 284   ------------------------------------------------------------------------------------------------------------------  Chemistries  Recent Labs  Lab 01/20/18 0231  NA 141  K 3.4*  CL 106  CO2 22    GLUCOSE 398*  BUN 14  CREATININE 0.97  CALCIUM 8.8*  AST 46*  ALT 36  ALKPHOS 62  BILITOT 0.6   ------------------------------------------------------------------------------------------------------------------  Cardiac Enzymes Recent Labs  Lab 01/20/18 0231  TROPONINI 0.06*   ------------------------------------------------------------------------------------------------------------------  RADIOLOGY:  Dg Chest Portable 1 View  Result Date: 01/20/2018 CLINICAL DATA:  57 y/o  M; status post intubation. EXAM: PORTABLE CHEST 1 VIEW COMPARISON:  01/20/2018 chest radiograph FINDINGS: Stable cardiomegaly given projection and technique. Interstitial and alveolar pulmonary edema. Probable small effusions. Endotracheal tube tip 4.5 cm above the carina. Transcutaneous pacing pads noted. Bones are unremarkable. IMPRESSION: 1. Endotracheal tube tip 4.5 cm above the carina. 2. Stable cardiomegaly and pulmonary edema. Probable small effusions. Electronically Signed   By: Kristine Garbe M.D.   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 Stable cardiomegaly. Nonaneurysmal thoracic aorta. Diffuse bilateral airspace disease and vascular congestion consistent with  pulmonary edema. No significant effusion or pneumothorax. No acute osseous abnormality. IMPRESSION: Stable cardiomegaly with pulmonary edema. Superimposed pneumonia is not entirely excluded but believed less likely. Electronically Signed   By: Ashley Royalty M.D.   On: 01/20/2018 02:56   IMPRESSION AND PLAN:   A/P: 34M acute hypoxemic respiratory failure, suspected 2/2 hypertensive emergency w/ flash pulmonary edema in the setting of high afterload state. Etiology not entirely clear however; broad DDx includes ischemic heart disease, heart failure,  pneumonia, ARDS, diffuse alveolar damage, ILD; other potential etiologies toxic/metabolic, thyroid disease, malignancy, pancreatitis. Other findings include respiratory acidosis/hypercapnoea, mild hypokalemia, hyperglycemia (w/ T2NIDDM), hypocalcemia, mild transaminasemia (AST 46), BNP elevation, Trop-I elevation, lactate elevation, leukocytosis. -In severe respiratory distress, hypoxemic respiratory failure, intubated/sedated in ED -Smoker, but no Dx COPD, (-) wheezing -ABG pH 7.13, pCO2 62, pO2 76 -Admit to ICU -Tele, continuous cardiac monitoring -Continuous pulse oximetry -Tachycardic to 140s (w/ occasional PVCs on monitor), BP as high as 227/144, TMax 100.26F (38.3C) while pt in ED -Started on NTG gtt -Presentation includes progressive non-productive cough, SOB, orthopnea, PND and leg swelling, concerning for cardiac etiology; tachycardia w/ PVCs, hypertension and fever may suggest infxn vs. Thyrotoxicosis -TSH, FT4, FT3 pending -Pt also likely volume depleted 2/2 pressure natriuresis -IVF -Echocardiogram pending -Initial Trop-I 0.06, rpt pending; elevation likely 2/2 demand ischemia in the setting of tachycardia, high afterload, dyspnea/respiratory failure; has cardiac risk factors, will monitor for ischemia -ASA325 x1, ASA81 qD (continue home med) -BNP 593, significance unclear given complexity of presentation -Lactate 5.4, 2/2 infxn vs. volume depletion vs. hepatic dysfxn vs. Metformin-induced vs. Other -Tachycardic, tachypneic, hypoxemic, febrile, (+) leukocytosis, SIRS (+) -U/A (-) UTI -CXR (+) cardiomegaly + pulmonary edema, though superimposed pneumonia not excluded (but less likely) -Given recent hospitalization, will cover for HCAP; empiric broad-spectrum ABx, Cefepime + Vancomycin for now -BCx x2, sputum Cx, UStrep + ULegionella urine Ag, PCT pending -CT chest when pt is able to tolerate (ordered w/o contrast, documented contrast allergy) -1/2NS + K+, monitor BMP -Mag + Phos  level pending -SSI, hold PO antihyperglycemics -Ionized calcium level pending -c/w other home meds -NPO -Lovenox -Full code -Admission, > 2 midnights  Addendum: Given reported leg swelling, also entertained possibility of DVT/PE. Documented iodinated contrast allergy prevents me from obtaining CTA chest, hence my order for dry CT. V/Q cannot be performed as pt intubated. Case d/w intensivist, will await Echo to evaluate for heart strain.   All the records are reviewed and case discussed with ED provider. Management plans discussed with the patient, family and they are in agreement.  CODE STATUS: Full code  TOTAL TIME TAKING CARE OF THIS PATIENT: 120 minutes.    Arta Silence M.D on 01/20/2018 at 4:06 AM  Between 7am to 6pm - Pager - [phone]  After 6pm go to [email or web address] - Proofreader  [facility]  Office  [phone]  CC: Primary care physician; Marguerita Merles, MD   Note: This dictation was prepared with Dragon dictation along with smaller phrase technology. Any transcriptional errors that result from this process are unintentional.

## 2018-01-20 NOTE — Progress Notes (Signed)
[facility] received a PG from the ED, regarding a patient presenting emergency traffic to room #26, The CH presented to the room, [name] was in resp distress on bi-pap support, ED physician, RT and nursing staff were tending to the patient, I inquired about family, told wife was in the lobby. I proceeded to the lobby, pastoral presence ensued, met wife and son, reassurance with words of comfort conferred upon them, I escorted her to the room, the MD spoke with her regarding the need to intubate him, the wife agreed, we prayed for [name] prior to the intubation, then I escorted [name] back to the waiting area she was calm, seemingly optimistic and in fair sprits. Went back to patient waiting area and conversed for a time until the ED staff came and escorted myself and [name] back to the room. [name] was sedated and mech ventilated in guarded but stable condition. physicians x two present along with staff. Silent prayer and pastoral presence's continued, plan is to transfer [name] back to the ICU, I escorted Mrs., Powell to the ICU lobby area. I told her I would be leaving m but some one from the [facility] staff would be rounding to check on her husband later in the morning. 

## 2018-01-20 NOTE — Consult Note (Signed)
[facility] Cardiology  CARDIOLOGY CONSULT NOTE  Patient ID: [name] MRN: [id number] DOB/AGE: Dec 26, 1960 57 y.o.  Admit date: 01/20/2018 Referring Physician Posey Pronto Primary Physician Delight Stare Primary Cardiologist Paraschos Reason for Consultation Elevated troponin, CHF  HPI: 57 year old male referred for evaluation of suspected non-STEMI and acute on chronic diastolic heart failure. The patient has a history of essential hypertension, tobacco use, OSA, multinodular goiter, type 2 diabetes, and recent admission in 12/2017 for treatment of pancreatitis. The patient reportedly had a 2-3 day history of bilateral lower extremity edema, nonproductive cough, dyspnea, and orthopnea.  He was brought to [facility] via EMS in respiratory distress requiring intubation. The patient had copious secretions during intubation requiring extensive suctioning. Blood pressure was as high as 230/140s. Chest xray revealed tracheal deviation secondary to known thyromegaly, stable cardiomegaly with pulmonary edema, without exclusion of superimposed pneumonia. Labs notable for borderline elevated troponin of 0.06, followed by 0.40, WBC 27.3, BNP 593, pH 7.1, lactic acid 5.4. ECG revealed sinus tachycardia at a rate of 163 bpm with nonspecific ST-T wave abnormalities, without evidence of acute ischemia. Of note, the patient underwent stress echocardiogram 02/07/2017, exercised 6 minutes on a Bruce protocol without chest pain or ECG changes. At baseline 2D echocardiogram revealed LVEF 45%. At peak exercise of EF is greater than 55%, with appropriate augmentation of all myocardial segments, without evidence for ischemia.   Review of systems complete and found to be negative unless listed above     Past Medical History:  Diagnosis Date  . Chest pain 2019  . Diabetes mellitus without complication ([place])   . Hypertension   . Sleep apnea     Past Surgical History:  Procedure Laterality Date  . COLONOSCOPY  2017  . LIPOMA  EXCISION     Left forehead    Medications Prior to Admission  Medication Sig Dispense Refill Last Dose  . aspirin EC 81 MG tablet Take 81 mg by mouth daily.   01/19/2018 at 1400  . losartan (COZAAR) 25 MG tablet Take 25 mg by mouth daily.   01/19/2018 at 1400  . metFORMIN (GLUCOPHAGE-XR) 500 MG 24 hr tablet Take 2,000 mg by mouth daily.    01/19/2018 at 1400  . saxagliptin HCl (ONGLYZA) 5 MG TABS tablet Take 5 mg by mouth daily.    01/19/2018 at 1400  . HYDROcodone-acetaminophen (NORCO/VICODIN) 5-325 MG tablet Take 1 tablet by mouth every 4 (four) hours as needed. (Patient not taking: Reported on 01/20/2018) 10 tablet 0 Not Taking at Unknown time  . polyethylene glycol powder (GLYCOLAX/MIRALAX) powder Take 17 g by mouth daily as needed for moderate constipation. (Patient not taking: Reported on 01/20/2018) 255 g 0 Not Taking at Unknown time   Social History   Socioeconomic History  . Marital status: Married    Spouse name: Not on file  . Number of children: Not on file  . Years of education: Not on file  . Highest education level: Not on file  Occupational History  . Not on file  Social Needs  . Financial resource strain: Not on file  . Food insecurity:    Worry: Not on file    Inability: Not on file  . Transportation needs:    Medical: Not on file    Non-medical: Not on file  Tobacco Use  . Smoking status: Current Every Day Smoker    Packs/day: 0.30    Years: 32.00    Pack years: 9.60    Types: Cigarettes  . Smokeless tobacco: Never  Used  Substance and Sexual Activity  . Alcohol use: No  . Drug use: Never  . Sexual activity: Not on file  Lifestyle  . Physical activity:    Days per week: Not on file    Minutes per session: Not on file  . Stress: Not on file  Relationships  . Social connections:    Talks on phone: Not on file    Gets together: Not on file    Attends religious service: Not on file    Active member of club or organization: Not on file    Attends meetings of  clubs or organizations: Not on file    Relationship status: Not on file  . Intimate partner violence:    Fear of current or ex partner: Not on file    Emotionally abused: Not on file    Physically abused: Not on file    Forced sexual activity: Not on file  Other Topics Concern  . Not on file  Social History Narrative  . Not on file    Family History  Problem Relation Age of Onset  . Diabetes Father   . Colon cancer Neg Hx       Review of systems complete and found to be negative unless listed above    PHYSICAL EXAM  General: Acutely-ill appearing, intubated, diaphoretic, in no acute distress HEENT:  Normocephalic and atramatic Neck:  No JVD.  Lungs: Decreased breath sounds thoughout Heart: HRRR . Normal S1 and S2 without gallops or murmurs.  Abdomen: nondistended Extremities: no peripheral edema.   Neuro: Intubated. Able to shake head for yes or no questions   Labs:   Lab Results  Component Value Date   WBC 27.3 (H) 01/20/2018   HGB 14.7 01/20/2018   HCT 43.9 01/20/2018   MCV 95.2 01/20/2018   PLT 284 01/20/2018    Recent Labs  Lab 01/20/18 0231  NA 141  K 3.4*  CL 106  CO2 22  BUN 14  CREATININE 0.97  CALCIUM 8.8*  PROT 8.4*  BILITOT 0.6  ALKPHOS 62  ALT 36  AST 46*  GLUCOSE 398*   Lab Results  Component Value Date   TROPONINI 0.40 ([place]) 01/20/2018   No results found for: CHOL No results found for: HDL No results found for: LDLCALC No results found for: TRIG No results found for: CHOLHDL No results found for: LDLDIRECT    Radiology: Dg Abd 1 View  Result Date: 01/20/2018 CLINICAL DATA:  NG tube placement EXAM: ABDOMEN - 1 VIEW COMPARISON:  None. FINDINGS: There is no nasogastric tube identified in the lower chest or abdomen. There is mild gaseous distension of the colon and stomach. There is no evidence of pneumoperitoneum, portal venous gas or pneumatosis. There are no pathologic calcifications along the expected course of the ureters. The  osseous structures are unremarkable. IMPRESSION: No nasogastric tube identified in the lower chest or abdomen. Electronically Signed   By: Kathreen Devoid   On: 01/20/2018 12:42   Dg Abd 1 View  Result Date: 01/20/2018 CLINICAL DATA:  Abdominal pain. EXAM: ABDOMEN - 1 VIEW COMPARISON:  None. FINDINGS: A right-sided pleural effusion is suspected layering posteriorly. Pacer paddles are noted on the chest. The bowel gas pattern is unremarkable. Air and stool scattered throughout the colon. A few scattered air-filled small bowel loops. No obvious free air. A right femoral line is noted. Bony structures are unremarkable. IMPRESSION: Suspect right pleural effusion. No plain film findings for an acute abdominal process. Electronically  Signed   By: Marijo Sanes M.D.   On: 01/20/2018 11:22   Ct Soft Tissue Neck Wo Contrast  Result Date: 01/20/2018 CLINICAL DATA:  57 y/o  M; thyroid nodule. EXAM: CT NECK WITHOUT CONTRAST TECHNIQUE: Multidetector CT imaging of the neck was performed following the standard protocol without intravenous contrast. COMPARISON:  None. FINDINGS: Pharynx and larynx: Debris within the airways from intubation. Salivary glands: No inflammation, mass, or stone. Thyroid: 5.6 cm nodule within the left lobe of the thyroid. 18 mm nodule in the right lobe of thyroid. Lymph nodes: None enlarged or abnormal density. Vascular: Negative. Limited intracranial: Negative. Visualized orbits: Negative. Mastoids and visualized paranasal sinuses: Mild mucosal thickening of the left maxillary sinus. Normal aeration of the mastoid air cells. Skeleton: No acute or aggressive process. Dental disease with multiple periapical cysts and absent crowns. Mild cervical spondylosis greatest at the C5-6 level. Upper chest: Endotracheal tube tip 3.4 cm above the carina. Moderate bilateral pleural effusions and dependent atelectasis of the lungs. Other: None. IMPRESSION: 1. Thyroid nodules measuring up to 5.6 cm in the left lobe  of thyroid. Mass effect from the large left lobe of thyroid nodule displaces the airway rightward. 2. Endotracheal tube tip 3.4 cm above the carina. 3. Moderate bilateral pleural effusion with dependent atelectasis of the lungs. Electronically Signed   By: Kristine Garbe M.D.   On: 01/20/2018 04:44   Dg Chest Portable 1 View  Result Date: 01/20/2018 CLINICAL DATA:  57 y/o  M; status post intubation. EXAM: PORTABLE CHEST 1 VIEW COMPARISON:  01/20/2018 chest radiograph FINDINGS: Stable cardiomegaly given projection and technique. Interstitial and alveolar pulmonary edema. Probable small effusions. Endotracheal tube tip 4.5 cm above the carina. Transcutaneous pacing pads noted. Bones are unremarkable. IMPRESSION: 1. Endotracheal tube tip 4.5 cm above the carina. 2. Stable cardiomegaly and pulmonary edema. Probable small effusions. Electronically Signed   By: Kristine Garbe M.D.   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 Stable cardiomegaly. Nonaneurysmal thoracic aorta. Diffuse bilateral airspace disease and vascular congestion consistent with pulmonary edema. No significant effusion or pneumothorax. No acute osseous abnormality. IMPRESSION: Stable cardiomegaly with pulmonary edema. Superimposed pneumonia is not entirely excluded but believed less likely. Electronically Signed   By: Ashley Royalty M.D.   On: 01/20/2018 02:56   US Thyroid  Result Date: 12/22/2017 CLINICAL DATA:  Incidental on CT. EXAM: THYROID ULTRASOUND TECHNIQUE: Ultrasound examination of the thyroid gland and adjacent soft tissues was performed. COMPARISON:  None. FINDINGS: Parenchymal Echotexture: Mildly heterogenous Isthmus: 0.3 cm Right lobe: 5.1 x 2.0 x 2.1 cm Left lobe: 6.2 x 4.6 x 3.7 cm  _________________________________________________________ Estimated total number of nodules >/= 1 cm: 2 Number of spongiform nodules >/=  2 cm not described below (TR1): 0 Number of mixed cystic and solid nodules >/= 1.5 cm not described below (TR2): 0 _________________________________________________________ Nodule # 1: Location: Right; Mid Maximum size: 2.6 cm; Other 2 dimensions: 1.5 x 1.5 cm Composition: solid/almost completely solid (2) Echogenicity: isoechoic (1) Shape: not taller-than-wide (0) Margins: smooth (0) Echogenic foci: none (0) ACR TI-RADS total points: 3. ACR TI-RADS risk category: TR3 (3 points). ACR TI-RADS recommendations: **Given size (>/= 2.5 cm) and appearance, fine needle aspiration of this mildly suspicious nodule should be considered based on TI-RADS criteria. _________________________________________________________ Nodule # 2: Location: Left; Mid  Maximum size: 5.8 cm; Other 2 dimensions: 4.0 x 3.1 cm Composition: solid/almost completely solid (2) Echogenicity: isoechoic (1) Shape: not taller-than-wide (0) Margins: smooth (0) Echogenic foci: none (0) ACR TI-RADS total points: 3. ACR TI-RADS risk category: TR3 (3 points). ACR TI-RADS recommendations: **Given size (>/= 2.5 cm) and appearance, fine needle aspiration of this mildly suspicious nodule should be considered based on TI-RADS criteria. _________________________________________________________ IMPRESSION: Nodules 1 and 2 both meet criteria for fine needle aspiration biopsy. The above is in keeping with the ACR TI-RADS recommendations - J Am Coll Radiol 2017;14:587-595. Electronically Signed   By: Marybelle Killings M.D.   On: 12/22/2017 16:10    EKG: Sinus rhythm, rate 82 bpm  ASSESSMENT AND PLAN:  1. Acute hypoxic hypercapnic respiratory failure, possibly secondary to hypertensive emergency with flash pulmonary edema, and possible pneumonia 2. Borderline elevated troponin in the absence of chest pain, possibly secondary to demand  supply ischemia in the setting of respiratory failure  3. Hypertension, much improved 4. Tachycardia, rate improved 5. Multinodular goiter with tracheal deviation, intubated  Plan: 1. Agree with overall therapy 2. Review 2D echocardiogram 3. Serial troponin 4. Further recommendations pending patient's initial course  Signed: Sharolyn Douglas 01/20/2018, 1:01 PM

## 2018-01-20 NOTE — Progress Notes (Signed)
Pt resting quietly at this time, wife at bedside, central line in place, IV Vanc, Mag Sulphate, Neo, Diprivan and Fentyl  All hanging. Lung sounds much improved, crackles in bases, clear upper and mid lobes . HR decreased to 70-80s and MAP above 65

## 2018-01-20 NOTE — Progress Notes (Signed)
Pt arrived from ED, intubated and awake, moving arms but not following commands. Diprivan increased and Fentrytl began , but pt is still very restless in bed  One  time dose of Versed given with good effect. ET tube in place, Diprovan, Fentanyl and NTG infusing at this time. NP in to see , to hold IV fluids at this time due to very congested lungs with crackles and rhonci. Unable to pass OG tube. Foley catheter in place , IV lasix given in ED , output 350 so far. At this time unable to locate wife

## 2018-01-20 NOTE — ED Notes (Signed)
Pt placed on zoll pads 

## 2018-01-20 NOTE — ED Triage Notes (Signed)
Pt BIB [facility] from home with resp distress for last 2 days, sees [facility] for nodule on thyroid that is pushing on trach per EMS with biopsy scheduled on 02/08/18. Pt hyperventilating and yelling "I can't breath." Hx HTN. EDP in rm.

## 2018-01-20 NOTE — ED Notes (Addendum)
PT to be intubated after discussion with EDP and pt and his wife, pt appears very tired at this time.

## 2018-01-20 NOTE — ED Provider Notes (Signed)
[facility] Emergency Department Provider Note  ____________________________________________   First MD Initiated Contact with Patient 01/20/18 0239     (approximate)  I have reviewed the triage vital signs and the nursing notes.   HISTORY  Chief Complaint Respiratory Distress  Level 5 caveat:  history/ROS limited by acute/critical illness  HPI [name] is a 57 y.o. male with medical history as listed below who presents in severe respiratory distress by EMS.  Reportedly he has been gradually getting more short of breath over the last couple of days.  Tonight he became acutely and severely worse and felt completely unable to catch his breath.  He was severely diaphoretic and shouting and speaking clearly but gasping with wide eyes and "guppy breathing".  He denies chest pain and denies any recent fever or chills.  He keeps yelling that he cannot breathe.  EMS was unable to get an SPO2 and reports that his blood pressure was greater than 656 systolic.  Heart rate is in the 140s.  We got an initial SPO2 of about 60% and I had them placed immediately on BiPAP.  See hospital course for details.  Medical history indicates that he has been recently worked up as an outpatient for 2 nodules on his thyroid and an 8 mm nodule in his lung.  There is concern that he may have cancer.  Past Medical History:  Diagnosis Date  . Chest pain 2019  . Diabetes mellitus without complication ([place])   . Hypertension   . Sleep apnea     Patient Active Problem List   Diagnosis Date Noted  . Acute hypoxemic respiratory failure ([place]) 01/20/2018  . Lipoma of back 11/08/2017    Past Surgical History:  Procedure Laterality Date  . COLONOSCOPY  2017  . LIPOMA EXCISION     Left forehead    Prior to Admission medications   Medication Sig Start Date End Date Taking? Authorizing Provider  aspirin EC 81 MG tablet Take 81 mg by mouth daily.   Yes [provider]    HYDROcodone-acetaminophen (NORCO/VICODIN) 5-325 MG tablet Take 1 tablet by mouth every 4 (four) hours as needed. 12/13/17   Harvest Dark, MD  losartan (COZAAR) 25 MG tablet Take 25 mg by mouth daily. 08/26/16   [provider]  metFORMIN (GLUCOPHAGE-XR) 500 MG 24 hr tablet Take 2,000 mg by mouth daily.     [provider]  polyethylene glycol powder (GLYCOLAX/MIRALAX) powder Take 17 g by mouth daily as needed for moderate constipation. 12/13/17   Harvest Dark, MD  saxagliptin HCl (ONGLYZA) 5 MG TABS tablet Take 5 mg by mouth daily.     [provider]    Allergies Enalapril maleate; Iodinated diagnostic agents; Isosorbide mononitrate er [isosorbide dinitrate]; and Canagliflozin  Family History  Problem Relation Age of Onset  . Diabetes Father   . Colon cancer Neg Hx     Social History Social History   Tobacco Use  . Smoking status: Current Every Day Smoker    Packs/day: 0.30    Years: 32.00    Pack years: 9.60    Types: Cigarettes  . Smokeless tobacco: Never Used  Substance Use Topics  . Alcohol use: No  . Drug use: Never    Review of Systems Level 5 caveat:  history/ROS limited by acute/critical illness.  Denies chest pain, denies recent fever/chills, gradually worsening shortness of breath x2 days, acutely worse tonight. ____________________________________________   PHYSICAL EXAM:  VITAL SIGNS: ED Triage  Vitals  Enc Vitals Group     BP 01/20/18 0221 (!) 203/150     Pulse Rate 01/20/18 0221 (!) 160     Resp 01/20/18 0221 (!) 39     Temp 01/20/18 0319 (!) 100.9 F (38.3 C)     Temp Source 01/20/18 0319 Rectal     SpO2 01/20/18 0221 (!) 53 %     Weight 01/20/18 0225 113.4 kg (250 lb)     Height 01/20/18 0225 1.778 m (5\' 10" )     Head Circumference --      Peak Flow --      Pain Score 01/20/18 0225 0     Pain Loc --      Pain Edu? --      Excl. in [place]? --     Constitutional: Alert and in severe respiratory distress with  pallor and severe diaphoresis Eyes: Conjunctivae are normal.  Head: Atraumatic. Nose: No congestion/rhinnorhea. Mouth/Throat: Mucous membranes are moist. Neck: No stridor.  No meningeal signs.   Cardiovascular: Tachycardia, regular rhythm. Good peripheral circulation. Grossly normal heart sounds. Respiratory: Severely increased respiratory effort with intercostal retractions and abdominal muscle usage, coarse breath sounds throughout with gurgling sounds clearly audible without the use of the stethoscope.  Speaking loudly in full sentences but gasping and fighting for breath. Gastrointestinal: Soft and nontender. No distention.  Musculoskeletal: No lower extremity tenderness nor edema. No gross deformities of extremities. Neurologic:  Normal speech and language. No gross focal neurologic deficits are appreciated.  Skin:  Skin is cool with severe diaphoresis. Psychiatric: Mood and affect are extremely anxious given his respiratory distress  ____________________________________________   LABS (all labs ordered are listed, but only abnormal results are displayed)  Labs Reviewed  BLOOD GAS, VENOUS - Abnormal; Notable for the following components:      Result Value   pH, Ven 7.13 (*)    pCO2, Ven 62 (*)    pO2, Ven 76.0 (*)    Acid-base deficit 9.6 (*)    All other components within normal limits  COMPREHENSIVE METABOLIC PANEL - Abnormal; Notable for the following components:   Potassium 3.4 (*)    Glucose, Bld 398 (*)    Calcium 8.8 (*)    Total Protein 8.4 (*)    AST 46 (*)    All other components within normal limits  CBC WITH DIFFERENTIAL/PLATELET - Abnormal; Notable for the following components:   WBC 27.3 (*)    Neutro Abs 12.8 (*)    Lymphs Abs 11.5 (*)    Monocytes Absolute 2.7 (*)    All other components within normal limits  TROPONIN I - Abnormal; Notable for the following components:   Troponin I 0.06 (*)    All other components within normal limits  BRAIN NATRIURETIC  PEPTIDE - Abnormal; Notable for the following components:   B Natriuretic Peptide 593.0 (*)    All other components within normal limits  LACTIC ACID, PLASMA - Abnormal; Notable for the following components:   Lactic Acid, Venous 5.4 (*)    All other components within normal limits  URINALYSIS, COMPLETE (UACMP) WITH MICROSCOPIC - Abnormal; Notable for the following components:   Color, Urine AMBER (*)    APPearance CLOUDY (*)    Glucose, UA >=500 (*)    Hgb urine dipstick SMALL (*)    Protein, ur >=300 (*)    All other components within normal limits  CULTURE, BLOOD (ROUTINE X 2)  CULTURE, BLOOD (ROUTINE X 2)  URINE CULTURE  MRSA PCR SCREENING  PROCALCITONIN  PROTIME-INR  LACTIC ACID, PLASMA  LIPASE, BLOOD  TSH  T4, FREE  T3, FREE  TROPONIN I  MAGNESIUM  PHOSPHORUS  TROPONIN I  BLOOD GAS, ARTERIAL   ____________________________________________  EKG  ED ECG REPORT I, Hinda Kehr, the attending physician, personally viewed and interpreted this ECG.  Date: 01/20/2018 EKG Time: 02:22 Rate: 163 Rhythm: sinus tachycardia QRS Axis: normal Intervals: normal ST/T Wave abnormalities: Non-specific ST segment / T-wave changes, but no evidence of acute ischemia. Narrative Interpretation: no evidence of acute ischemia   ____________________________________________  RADIOLOGY I, Hinda Kehr, personally viewed and evaluated these images (plain radiographs) as part of my medical decision making, as well as reviewing the written report by the radiologist.  ED MD interpretation:  pulmonary edema  Official radiology report(s): Dg Chest Portable 1 View  Result Date: 01/20/2018 CLINICAL DATA:  56 y/o  M; status post intubation. EXAM: PORTABLE CHEST 1 VIEW COMPARISON:  01/20/2018 chest radiograph FINDINGS: Stable cardiomegaly given projection and technique. Interstitial and alveolar pulmonary edema. Probable small effusions. Endotracheal tube tip 4.5 cm above the carina.  Transcutaneous pacing pads noted. Bones are unremarkable. IMPRESSION: 1. Endotracheal tube tip 4.5 cm above the carina. 2. Stable cardiomegaly and pulmonary edema. Probable small effusions. Electronically Signed   By: Kristine Garbe M.D.   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 Stable cardiomegaly. Nonaneurysmal thoracic aorta. Diffuse bilateral airspace disease and vascular congestion consistent with pulmonary edema. No significant effusion or pneumothorax. No acute osseous abnormality. IMPRESSION: Stable cardiomegaly with pulmonary edema. Superimposed pneumonia is not entirely excluded but believed less likely. Electronically Signed   By: Ashley Royalty M.D.   On: 01/20/2018 02:56    ____________________________________________   PROCEDURES  Critical Care performed: Yes, see critical care procedure note(s)   Procedure(s) performed:   .Critical Care Performed by: Hinda Kehr, MD Authorized by: Hinda Kehr, MD   Critical care provider statement:    Critical care time (minutes):  75   Critical care time was exclusive of:  Separately billable procedures and treating other patients   Critical care was necessary to treat or prevent imminent or life-threatening deterioration of the following conditions:  Respiratory failure and sepsis   Critical care was time spent personally by me on the following activities:  Development of treatment plan with patient or surrogate, discussions with consultants, evaluation of patient's response to treatment, examination of patient, obtaining history from patient or surrogate, ordering and performing treatments and interventions, ordering and review of laboratory studies, ordering and review of radiographic studies, pulse oximetry, re-evaluation of patient's  condition and review of old charts Procedure Name: Intubation Date/Time: 01/20/2018 3:36 AM Performed by: Hinda Kehr, MD Pre-anesthesia Checklist: Patient identified, Emergency Drugs available, Suction available and Patient being monitored Preoxygenation: Pre-oxygenation with 100% oxygen (BiPap) Induction Type: IV induction and Rapid sequence Laryngoscope Size: Glidescope Tube size: 7.5 mm Number of attempts: 2 Placement Confirmation: ETT inserted through vocal cords under direct vision,  CO2 detector and Breath sounds checked- equal and bilateral Secured at: 24 cm Tube secured with: ETT holder Dental Injury: Teeth and Oropharynx as per pre-operative assessment         ____________________________________________   INITIAL IMPRESSION / ASSESSMENT AND PLAN / ED COURSE  As part of my medical decision making, I reviewed the  following data within the [place] History obtained from family, Nursing notes reviewed and incorporated, Labs reviewed , EKG interpreted , Old chart reviewed, Radiograph reviewed  and Discussed with admitting physician     Differential diagnosis includes, but is not limited to, flash pulmonary edema in the setting of hypertensive emergency, pneumonia, PE, thyrotoxicosis leading to the flash pulmonary edema and vital sign abnormalities.  As documented above, I started the patient immediately on BiPAP to try to preoxygenate because he was toxic enough in appearance upon arrival that it was clear he would require intubation.  Initial blood pressure for [place] was severely hypertensive in the 353 systolic over 614 diastolic range and I ordered nitroglycerin drip at 100 mcg/min.  The patient reported subjective improvement with his work of breathing any calm down (I also administered Ativan 1 mg IV).  He was feeling better but not looking much better and beginning to get sleepy.  I evaluated the chest x-ray at bedside which was consistent with pulmonary  edema.  His wife was there by that point and I discussed with both of them at the bedside that I felt he required intubation and they understand and agreed.  I emergently intubated him using RSI, medications included ketamine 200 mg IV and rocuronium 100 mg IV.  My initial plan was to intubate him awake using the ketamine only so as not to paralyze him, but he was to clamp down and he required paralysis in order for successful intubation.  He had copious pulmonary edema in the airway and required extensive suctioning after airway was established.  His ABG came back in the meantime demonstrating a pH of about 7.1 with hypercapnia in the 60s.  Since his respiratory rate was in the 40s and he was acidotic I tried to compensate as best I could with a respiratory rate of 24, tidal volume of 500 mL, FiO2 100%, and a PEEP of 14 given the difficulty with oxygenation.  This seemed to be successful and his oxygen level was in the upper 90s.  I was maintaining sedation with propofol as per nursing notes although I did have to back off on the nitroglycerin due to his blood pressure dropping down all the way to about 431 systolic.  I also ordered fentanyl 150 mcg/h to help with the pain in the sedation requirements.  Awaiting lab work and official radiology interpretation of post intubation film.  I will also contact hospitalist for admission.  Clinical Course as of Jan 21 408  Fri Jan 20, 2018  0321 Initially the lactic acid of 5.4 was thought to be the result of flash pulmonary edema and respiratory failure.  However, the patient is febrile at 100.9. I had already ordered HCAP antibiotics and blood cultures.  Patient has copious pulmonary edema, I cannot order IV fluids at this time, but will discuss with the hospitalist.  Lactic Acid, Venous(!!): 5.4 [CF]  0321 Temp(!): 100.9 F (38.3 C) [CF]  0323 Troponin I(!!): 0.06 [CF]  0325 Ordered code sepsis, and as described above, ordered the empiric antibiotics and  cultures.  But it is not appropriate to order 24mL/kg at this point given the copious pulmonary edema that is requiring frequent suctioning.  Will defer to ICU team once edema has been adequately addressed.   [CF]  0327 WBC(!): 27.3 [CF]  0327 Glucose(!): 398 [CF]  0339 B Natriuretic Peptide(!): 593.0 [CF]  0353 Discussed extensively in person with Dr. Jodell Cipro with the hospitalist service. He agrees with  mangement plan including code sepsis, holding on IV fluids until pulmonary status has improved, etc.  He appropriately added on thyroid function tests as well given the thyroid nodules and the possibility of thyrotoxicosis.  I also updated the patient's wife at bedside.  Patient is critically ill but stable for transport to the ICU.   [CF]  0408 Essentially unchanged appearance on chest x-ray except for appropriately placed ET tube.  Patient is now being transferred to the ICU.   [CF]    Clinical Course User Index [CF] Hinda Kehr, MD    ____________________________________________  FINAL CLINICAL IMPRESSION(S) / ED DIAGNOSES  Final diagnoses:  Acute respiratory failure with hypoxia and hypercapnia (HCC)  Acidosis  Demand ischemia (HCC)  Fever, unspecified fever cause  Elevated lactic acid level  Elevated troponin I level  Sepsis, due to unspecified organism ([place])     MEDICATIONS GIVEN DURING THIS VISIT:  Medications  fentaNYL 2580mcg in NS 261mL (27mcg/ml) infusion-PREMIX (has no administration in time range)  ceFEPIme (MAXIPIME) 2 g in sodium chloride 0.9 % 100 mL IVPB (2 g Intravenous New Bag/Given 01/20/18 0344)  vancomycin (VANCOCIN) IVPB 1000 mg/200 mL premix (has no administration in time range)  0.45 % NaCl with KCl 20 mEq / L infusion (has no administration in time range)  aspirin EC tablet 325 mg (has no administration in time range)  magnesium sulfate IVPB 2 g 50 mL (has no administration in time range)  LORazepam (ATIVAN) injection 1 mg (1 mg Intravenous Given  01/20/18 0222)  nitroGLYCERIN 50 mg in dextrose 5 % 250 mL (0.2 mg/mL) infusion (50 mcg/min Intravenous Restarted 01/20/18 0312)  ketamine (KETALAR) injection 200 mg (200 mg Intravenous Given 01/20/18 0257)  rocuronium (ZEMURON) injection 100 mg (100 mg Intravenous Given 01/20/18 0300)  propofol (DIPRIVAN) 1000 MG/100ML infusion (10 mcg/kg/min  113.4 kg Intravenous New Bag/Given 01/20/18 0300)  furosemide (LASIX) injection 40 mg (40 mg Intravenous Given 01/20/18 0310)     ED Discharge Orders    None       Note:  This document was prepared using Dragon voice recognition software and may include unintentional dictation errors.    Hinda Kehr, MD 01/20/18 [phone]

## 2018-01-20 NOTE — Progress Notes (Signed)
Pharmacy Antibiotic Note  [name] is a 57 y.o. male admitted on 01/20/2018 with pneumonia.  Pharmacy has been consulted for vanc/cefepime dosing. Patient received vanc 1g and cefepime 2g IV x 1.  Plan: Will continue vanc 1g IV q8h w/ 6 hour stack  Will draw vanc trough 07/20 @ 1100 prior to 4th dose. Will continue cefepime 2g IV q8h.  Ke 0.108 T1/2 6 ~ 8 hrs Goal trough 15 - 20 mcg/mL  Height: 5\' 10"  (177.8 cm) Weight: 250 lb (113.4 kg) IBW/kg (Calculated) : 73  Temp (24hrs), Avg:100.4 F (38 C), Min:99.8 F (37.7 C), Max:100.9 F (38.3 C)  Recent Labs  Lab 01/20/18 0231  WBC 27.3*  CREATININE 0.97  LATICACIDVEN 5.4*    Estimated Creatinine Clearance: 106 mL/min (by C-G formula based on SCr of 0.97 mg/dL).    Allergies  Allergen Reactions  . Enalapril Maleate Anaphylaxis  . Iodinated Diagnostic Agents Itching  . Isosorbide Mononitrate Er [Isosorbide Dinitrate] Other (See Comments)    "made me not be able to think or function"  . Canagliflozin Other (See Comments)    Thank you for allowing pharmacy to be a part of this patient's care.  Tobie Lords, PharmD, BCPS Clinical Pharmacist 01/20/2018

## 2018-01-20 NOTE — Progress Notes (Signed)
Initial Nutrition Assessment  DOCUMENTATION CODES:   Obesity unspecified  INTERVENTION:  If unable to successfully place dobhoff tube, recommend having it placed via IR with fluoro  When tubefeeding is started, begin Vital High Protein at 81mL/hr, Pro-stat 17mL TID  Provides 1620 calories, 161 grams of protein, 1168mL free water  NUTRITION DIAGNOSIS:   Inadequate oral intake related to inability to eat(intubated) as evidenced by NPO status.  GOAL:   Provide needs based on ASPEN/SCCM guidelines  MONITOR:   Vent status, TF tolerance, I & O's  REASON FOR ASSESSMENT:   Ventilator    ASSESSMENT:   Patient with PMH OSA, HTN, DM, Multinodular Goiter, Everyday Smoker. 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   Spoke with patient's wife at bedside. She reports patient was admitted 1 month ago for pancreatitis and was on liquids for approximately 1 week. Since then she states his appetite has improved, was eating 2 meals a day at home. Denies any weight loss with UBW of 230-235 pounds. He was unable to have an OGT placed when intubated due to deviated trachea. Dobhoff was placed earlier that was measured at 70cm from the nare, xray shows no dobhoff in the lower chest or abdomen. RN plans to try other nare. Will continue to monitor.  Patient is currently intubated on ventilator support MV: 11 L/min Temp (24hrs), Avg:98.8 F (37.1 C), Min:97.4 F (36.3 C), Max:100.9 F (38.3 C)  Propofol: None  Medications reviewed and include:  Insulin Neo gtt  Labs reviewed:  K+ 3.4 CBGs 310, 348  MAP: 81-94  NUTRITION - FOCUSED PHYSICAL EXAM:    Most Recent Value  Orbital Region  No depletion  Upper Arm Region  No depletion  Thoracic and Lumbar Region  No depletion  Buccal Region  No depletion  Temple Region  No depletion  Clavicle Bone Region  No depletion  Clavicle and Acromion Bone Region  No  depletion  Scapular Bone Region  No depletion  Dorsal Hand  No depletion  Patellar Region  No depletion  Anterior Thigh Region  No depletion  Posterior Calf Region  No depletion  Edema (RD Assessment)  None  Hair  Reviewed  Eyes  Reviewed  Mouth  Unable to assess  Skin  Reviewed  Nails  Reviewed       Diet Order:   Diet Order           Diet NPO time specified  Diet effective now          EDUCATION NEEDS:   No education needs have been identified at this time  Skin:  Skin Assessment: Reviewed RN Assessment  Last BM:  PTA  Height:   Ht Readings from Last 1 Encounters:  01/20/18 5\' 10"  (1.778 m)    Weight:   Wt Readings from Last 1 Encounters:  01/20/18 250 lb (113.4 kg)    Ideal Body Weight:  75.45 kg  BMI:  Body mass index is 35.87 kg/m.  Estimated Nutritional Needs:   Kcal:  1245-8099 calories  Protein:  >150 grams  Fluid:  Per MD    Satira Anis. Morgon Pamer, MS, RD LDN Inpatient Clinical Dietitian Pager [phone]

## 2018-01-20 NOTE — ED Notes (Signed)
Fluid bolus not initiated per verbal EDP for possible flash pulm edema

## 2018-01-20 NOTE — Progress Notes (Signed)
[place] [facility]                                                                                                                                                                                  Patient Demographics   [name], is a 57 y.o. male, DOB - [birth year], XHB:[id number]  Admit date - 01/20/2018   Admitting Physician Arta Silence, MD  Outpatient Primary MD for the patient is Marguerita Merles, MD   LOS - 0  Subjective: Patient remains intubated.   Review of Systems:   CONSTITUTIONAL: Unable to provide  Vitals:   Vitals:   01/20/18 1133 01/20/18 1200 01/20/18 1300 01/20/18 1514  BP:  98/73 104/81   Pulse:  72 71   Resp:  20 20   Temp:  98.3 F (36.8 C)    TempSrc:  Axillary    SpO2: 100% 100% 100% 100%  Weight:      Height:        Wt Readings from Last 3 Encounters:  01/20/18 113.4 kg (250 lb)  12/13/17 102.1 kg (225 lb)  11/08/17 108 kg (238 lb)     Intake/Output Summary (Last 24 hours) at 01/20/2018 1548 Last data filed at 01/20/2018 1248 Gross per 24 hour  Intake 1050.73 ml  Output 650 ml  Net 400.73 ml    Physical Exam:   GENERAL: Critically ill HEAD, EYES, EARS, NOSE AND THROAT: Atraumatic, normocephalic. Extraocular muscles are intact. Pupils equal and reactive to light. Sclerae anicteric. No conjunctival injection. No oro-pharyngeal erythema.  NECK: Supple. There is no jugular venous distention. No bruits, no lymphadenopathy, no thyromegaly.  HEART: Regular rate and rhythm,. No murmurs, no rubs, no clicks.  LUNGS: On the vent ABDOMEN: Soft, flat, nontender, nondistended. Has good bowel sounds. No hepatosplenomegaly appreciated.  EXTREMITIES: No evidence of any cyanosis, clubbing, or peripheral edema.  +2 pedal and radial pulses bilaterally.  NEUROLOGIC: Sedated  sKIN: Moist and warm with no rashes appreciated.  Psych: Not anxious, depressed LN: No inguinal LN enlargement    Antibiotics    Anti-infectives (From admission, onward)   Start     Dose/Rate Route Frequency Ordered Stop   01/20/18 1200  ceFEPIme (MAXIPIME) 2 g in sodium chloride 0.9 % 100 mL IVPB     2 g 200 mL/hr over 30 Minutes Intravenous Every 8 hours 01/20/18 0618     01/20/18 1200  vancomycin (VANCOCIN) IVPB 1000 mg/200 mL premix     1,000 mg 200 mL/hr over 60 Minutes Intravenous Every 8 hours 01/20/18 0649     01/20/18 0330  ceFEPIme (MAXIPIME) 2 g in sodium chloride 0.9 % 100 mL IVPB  2 g 200 mL/hr over 30 Minutes Intravenous  Once 01/20/18 0321 01/20/18 0517   01/20/18 0330  vancomycin (VANCOCIN) IVPB 1000 mg/200 mL premix     1,000 mg 200 mL/hr over 60 Minutes Intravenous  Once 01/20/18 0321 01/20/18 0724      Medications   Scheduled Meds: . aspirin  81 mg Oral Daily  . atorvastatin  40 mg Oral q1800  . chlorhexidine gluconate (MEDLINE KIT)  15 mL Mouth Rinse BID  . Chlorhexidine Gluconate Cloth  6 each Topical Q0600  . enoxaparin (LOVENOX) injection  1 mg/kg Subcutaneous Q12H  . insulin aspart  0-15 Units Subcutaneous Q4H  . insulin glargine  10 Units Subcutaneous Daily  . losartan  25 mg Oral Daily  . mouth rinse  15 mL Mouth Rinse 10 times per day  . midazolam  2 mg Intravenous Once  . mupirocin ointment  1 application Nasal BID   Continuous Infusions: . sodium chloride    . ceFEPime (MAXIPIME) IV Stopped (01/20/18 1402)  . dexmedetomidine (PRECEDEX) IV infusion 0.8 mcg/kg/hr (01/20/18 1535)  . famotidine (PEPCID) IV    . fentaNYL infusion INTRAVENOUS 200 mcg/hr (01/20/18 0510)  . phenylephrine (NEO-SYNEPHRINE) Adult infusion 10 mcg/min (01/20/18 1535)  . potassium chloride Stopped (01/20/18 1300)  . propofol (DIPRIVAN) infusion Stopped (01/20/18 1249)  . vancomycin Stopped (01/20/18 1402)   PRN Meds:.sodium chloride, acetaminophen, bisacodyl, fentaNYL (SUBLIMAZE) injection, ondansetron (ZOFRAN) IV, potassium chloride, senna-docusate   Data Review:   Micro Results Recent  Results (from the past 240 hour(s))  Blood Culture (routine x 2)     Status: None (Preliminary result)   Collection Time: 01/20/18  3:25 AM  Result Value Ref Range Status   Specimen Description BLOOD LEFT WRIST  Final   Special Requests   Final    BOTTLES DRAWN AEROBIC AND ANAEROBIC Blood Culture adequate volume   Culture   Final    NO GROWTH < 12 HOURS Performed at [facility], [address], [place], [place] [address]    Report Status PENDING  Incomplete  Blood Culture (routine x 2)     Status: None (Preliminary result)   Collection Time: 01/20/18  3:25 AM  Result Value Ref Range Status   Specimen Description BLOOD LEFT HAND   Final   Special Requests   Final    BOTTLES DRAWN AEROBIC AND ANAEROBIC Blood Culture adequate volume   Culture   Final    NO GROWTH < 12 HOURS Performed at [facility], [address], [place], [place] [address]    Report Status PENDING  Incomplete  MRSA PCR Screening     Status: Abnormal   Collection Time: 01/20/18  3:38 AM  Result Value Ref Range Status   MRSA by PCR POSITIVE (A) NEGATIVE Final    Comment:        The GeneXpert MRSA Assay (FDA approved for NASAL specimens only), is one component of a comprehensive MRSA colonization surveillance program. It is not intended to diagnose MRSA infection nor to guide or monitor treatment for MRSA infections. RESULT CALLED TO, READ BACK BY AND VERIFIED WITH: CHARLI FLEETWOOD AT 0700 01/20/18 SDR Performed at [facility], [address] [place], [place] [address]   Culture, respiratory (NON-Expectorated)     Status: None (Preliminary result)   Collection Time: 01/20/18  8:27 AM  Result Value Ref Range Status   Specimen Description   Final    TRACHEAL ASPIRATE Performed at [facility], [place],  [place], [place] [address]    Special Requests   Final    NONE Performed at [facility], [place], [place] [address]    Gram  Stain   Final    RARE WBC PRESENT, PREDOMINANTLY PMN RARE GRAM POSITIVE COCCI FEW GRAM POSITIVE RODS Performed at [facility], [place] [address], [place], [place] [address]    Culture PENDING  Incomplete   Report Status PENDING  Incomplete    Radiology Reports Dg Abd 1 View  Result Date: 01/20/2018 CLINICAL DATA:  Encounter for NG tube placement EXAM: ABDOMEN - 1 VIEW COMPARISON:  01/20/2018 FINDINGS: Malpositioned nasogastric tube, coiled backwards overlying the mid esophagus with tip excluded from view Endotracheal tube overlies the tracheal air column with tip superior to the carina. No pneumothorax. Pacer leads overlie the left ventricular apex. Pulmonary vasculature remains indistinct with cephalization of flow. Suspected trace right-sided pleural effusion. No definite left-sided pleural effusion, though the left costophrenic angle is excluded from view. Nonobstructive bowel gas pattern. No pneumoperitoneum, pneumatosis or portal venous gas. IMPRESSION: 1. Malpositioned nasogastric tube.  Repositioning is advised. 2. Similar findings of pulmonary edema and trace right-sided pleural effusion. 3. Nonobstructive bowel gas pattern. Electronically Signed   By: Sandi Mariscal M.D.   On: 01/20/2018 14:03   Dg Abd 1 View  Result Date: 01/20/2018 CLINICAL DATA:  NG tube placement EXAM: ABDOMEN - 1 VIEW COMPARISON:  None. FINDINGS: There is no nasogastric tube identified in the lower chest or abdomen. There is mild gaseous distension of the colon and stomach. There is no evidence of pneumoperitoneum, portal venous gas or pneumatosis. There are no pathologic calcifications along the expected course of the ureters. The osseous structures are unremarkable. IMPRESSION: No nasogastric tube identified in the lower chest or abdomen. Electronically Signed   By: Kathreen Devoid   On: 01/20/2018 12:42   Dg Abd 1 View  Result Date: 01/20/2018 CLINICAL DATA:  Abdominal pain. EXAM: ABDOMEN - 1 VIEW COMPARISON:   None. FINDINGS: A right-sided pleural effusion is suspected layering posteriorly. Pacer paddles are noted on the chest. The bowel gas pattern is unremarkable. Air and stool scattered throughout the colon. A few scattered air-filled small bowel loops. No obvious free air. A right femoral line is noted. Bony structures are unremarkable. IMPRESSION: Suspect right pleural effusion. No plain film findings for an acute abdominal process. Electronically Signed   By: Marijo Sanes M.D.   On: 01/20/2018 11:22   Ct Soft Tissue Neck Wo Contrast  Result Date: 01/20/2018 CLINICAL DATA:  57 y/o  M; thyroid nodule. EXAM: CT NECK WITHOUT CONTRAST TECHNIQUE: Multidetector CT imaging of the neck was performed following the standard protocol without intravenous contrast. COMPARISON:  None. FINDINGS: Pharynx and larynx: Debris within the airways from intubation. Salivary glands: No inflammation, mass, or stone. Thyroid: 5.6 cm nodule within the left lobe of the thyroid. 18 mm nodule in the right lobe of thyroid. Lymph nodes: None enlarged or abnormal density. Vascular: Negative. Limited intracranial: Negative. Visualized orbits: Negative. Mastoids and visualized paranasal sinuses: Mild mucosal thickening of the left maxillary sinus. Normal aeration of the mastoid air cells. Skeleton: No acute or aggressive process. Dental disease with multiple periapical cysts and absent crowns. Mild cervical spondylosis greatest at the C5-6 level. Upper chest: Endotracheal tube tip 3.4 cm above the carina. Moderate bilateral pleural effusions and dependent atelectasis of the lungs. Other: None. IMPRESSION: 1. Thyroid nodules measuring up to 5.6 cm in the left lobe of thyroid.  Mass effect from the large left lobe of thyroid nodule displaces the airway rightward. 2. Endotracheal tube tip 3.4 cm above the carina. 3. Moderate bilateral pleural effusion with dependent atelectasis of the lungs. Electronically Signed   By: Kristine Garbe M.D.    On: 01/20/2018 04:44   Dg Chest Portable 1 View  Result Date: 01/20/2018 CLINICAL DATA:  57 y/o  M; status post intubation. EXAM: PORTABLE CHEST 1 VIEW COMPARISON:  01/20/2018 chest radiograph FINDINGS: Stable cardiomegaly given projection and technique. Interstitial and alveolar pulmonary edema. Probable small effusions. Endotracheal tube tip 4.5 cm above the carina. Transcutaneous pacing pads noted. Bones are unremarkable. IMPRESSION: 1. Endotracheal tube tip 4.5 cm above the carina. 2. Stable cardiomegaly and pulmonary edema. Probable small effusions. Electronically Signed   By: Kristine Garbe M.D.   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 Stable cardiomegaly. Nonaneurysmal thoracic aorta. Diffuse bilateral airspace disease and vascular congestion consistent with pulmonary edema. No significant effusion or pneumothorax. No acute osseous abnormality. IMPRESSION: Stable cardiomegaly with pulmonary edema. Superimposed pneumonia is not entirely excluded but believed less likely. Electronically Signed   By: Ashley Royalty M.D.   On: 01/20/2018 02:56   US Thyroid  Result Date: 12/22/2017 CLINICAL DATA:  Incidental on CT. EXAM: THYROID ULTRASOUND TECHNIQUE: Ultrasound examination of the thyroid gland and adjacent soft tissues was performed. COMPARISON:  None. FINDINGS: Parenchymal Echotexture: Mildly heterogenous Isthmus: 0.3 cm Right lobe: 5.1 x 2.0 x 2.1 cm Left lobe: 6.2 x 4.6 x 3.7 cm _________________________________________________________ Estimated total number of nodules >/= 1 cm: 2 Number of spongiform nodules >/=  2 cm not described below (TR1): 0 Number of mixed cystic and solid nodules >/= 1.5 cm not described below (TR2): 0 _________________________________________________________ Nodule # 1:  Location: Right; Mid Maximum size: 2.6 cm; Other 2 dimensions: 1.5 x 1.5 cm Composition: solid/almost completely solid (2) Echogenicity: isoechoic (1) Shape: not taller-than-wide (0) Margins: smooth (0) Echogenic foci: none (0) ACR TI-RADS total points: 3. ACR TI-RADS risk category: TR3 (3 points). ACR TI-RADS recommendations: **Given size (>/= 2.5 cm) and appearance, fine needle aspiration of this mildly suspicious nodule should be considered based on TI-RADS criteria. _________________________________________________________ Nodule # 2: Location: Left; Mid Maximum size: 5.8 cm; Other 2 dimensions: 4.0 x 3.1 cm Composition: solid/almost completely solid (2) Echogenicity: isoechoic (1) Shape: not taller-than-wide (0) Margins: smooth (0) Echogenic foci: none (0) ACR TI-RADS total points: 3. ACR TI-RADS risk category: TR3 (3 points). ACR TI-RADS recommendations: **Given size (>/= 2.5 cm) and appearance, fine needle aspiration of this mildly suspicious nodule should be considered based on TI-RADS criteria. _________________________________________________________ IMPRESSION: Nodules 1 and 2 both meet criteria for fine needle aspiration biopsy. The above is in keeping with the ACR TI-RADS recommendations - J Am Coll Radiol 2017;14:587-595. Electronically Signed   By: Marybelle Killings M.D.   On: 12/22/2017 16:10     CBC Recent Labs  Lab 01/20/18 0231  WBC 27.3*  HGB 14.7  HCT 43.9  PLT 284  MCV 95.2  MCH 32.0  MCHC 33.6  RDW 13.5  LYMPHSABS 11.5*  MONOABS 2.7*  EOSABS 0.3  BASOSABS 0.0    Chemistries  Recent Labs  Lab 01/20/18 0231  NA 141  K 3.4*  CL 106  CO2 22  GLUCOSE 398*  BUN 14  CREATININE 0.97  CALCIUM 8.8*  MG 1.9  AST 46*  ALT 36  ALKPHOS 62  BILITOT 0.6   ------------------------------------------------------------------------------------------------------------------ estimated creatinine clearance is 106 mL/min (by C-G formula based on SCr of 0.97  mg/dL). ------------------------------------------------------------------------------------------------------------------ No results for input(s): HGBA1C in the last 72 hours. ------------------------------------------------------------------------------------------------------------------ Recent Labs    01/20/18 0459  CHOL 191  HDL NOT REPORTED DUE TO HIGH TRIGLYCERIDES  LDLCALC UNABLE TO CALCULATE IF TRIGLYCERIDE OVER 400 mg/dL  TRIG 1,658*  CHOLHDL NOT REPORTED DUE TO HIGH TRIGLYCERIDES   ------------------------------------------------------------------------------------------------------------------ Recent Labs    01/20/18 0231  TSH 4.696*   ------------------------------------------------------------------------------------------------------------------ No results for input(s): VITAMINB12, FOLATE, FERRITIN, TIBC, IRON, RETICCTPCT in the last 72 hours.  Coagulation profile Recent Labs  Lab 01/20/18 0231  INR 0.91    No results for input(s): DDIMER in the last 72 hours.  Cardiac Enzymes Recent Labs  Lab 01/20/18 0459 01/20/18 0634 01/20/18 1342  TROPONINI 0.21* 0.40* 0.45*   ------------------------------------------------------------------------------------------------------------------ Invalid input(s): POCBNP    Assessment & Plan   Patient is 57 year old with acute respiratory failure  1.  Acute respiratory failure status post intubation possibly due to CHF with bilateral pleural effusion Continue ventilator support Weight echocardiogram of the heart Continue IV antibiotics  2.  Diabetes type 2 continue Lantus and sliding scale insulin  3.  Hypertension continue Cozaar  4.  Elevated troponin likely due to demand ischemia follow cardiac enzymes echocardiogram of the heart pending  5.  Sleep apnea  6.  Lovenox for DVT prophylaxis     Code Status Orders  (From admission, onward)        Start     Ordered   01/20/18 0415  Full code  Continuous      01/20/18 0414    Code Status History    This patient has a current code status but no historical code status.    Advance Directive Documentation     Most Recent Value  Type of Advance Directive  Healthcare Power of Attorney, Living will  Pre-existing out of facility DNR order (yellow form or pink MOST form)  -  "MOST" Form in Place?  -           Consults  intestivist  DVT Prophylaxis   lovenox  Lab Results  Component Value Date   PLT 284 01/20/2018     Time Spent in minutes  50mn Greater than 50% of time spent in care coordination and counseling patient regarding the condition and plan of care.   SDustin FlockM.D on 01/20/2018 at 3:48 PM  Between 7am to 6pm - Pager - [phone]  After 6pm go to [email or web address] - [email or web address]   Office  [phone]

## 2018-01-20 NOTE — Progress Notes (Signed)
Pt was very restless, then became calm after IV Versed given, then one 1 cycle BP dropped from 179 systolic to 56. BP rechecked, NTG off, Diprivan and Fentyl doses decreased, pt continues to rest well but BP still in 50s. Peep decreased from 14 to 8, BP in 50s, IV Neo started and titrated. Wife at bedside, consent for central line obtained and NP is placing line now. We will then begin quad strength Neo

## 2018-01-20 NOTE — Progress Notes (Signed)
Maggie, NP notified of patient's most recent troponin level.  No change in plan of care at this time.  Will continue to monitor.

## 2018-01-20 NOTE — ED Notes (Signed)
Unable to place OG tube at this time, OG tube has difficulty advancing by Raquel, RN, Ambulance person and this RN

## 2018-01-21 ENCOUNTER — Inpatient Hospital Stay: Payer: BLUE CROSS/BLUE SHIELD

## 2018-01-21 DIAGNOSIS — J810 Acute pulmonary edema: Secondary | ICD-10-CM

## 2018-01-21 DIAGNOSIS — I248 Other forms of acute ischemic heart disease: Secondary | ICD-10-CM

## 2018-01-21 DIAGNOSIS — N179 Acute kidney failure, unspecified: Secondary | ICD-10-CM

## 2018-01-21 LAB — GLUCOSE, CAPILLARY
GLUCOSE-CAPILLARY: 131 mg/dL — AB (ref 70–99)
GLUCOSE-CAPILLARY: 165 mg/dL — AB (ref 70–99)
GLUCOSE-CAPILLARY: 169 mg/dL — AB (ref 70–99)
GLUCOSE-CAPILLARY: 203 mg/dL — AB (ref 70–99)
Glucose-Capillary: 161 mg/dL — ABNORMAL HIGH (ref 70–99)
Glucose-Capillary: 166 mg/dL — ABNORMAL HIGH (ref 70–99)

## 2018-01-21 LAB — TSH: TSH: 0.723 u[IU]/mL (ref 0.350–4.500)

## 2018-01-21 LAB — VANCOMYCIN, TROUGH: VANCOMYCIN TR: 23 ug/mL — AB (ref 15–20)

## 2018-01-21 LAB — CBC
HCT: 35.8 % — ABNORMAL LOW (ref 40.0–52.0)
HEMOGLOBIN: 12.1 g/dL — AB (ref 13.0–18.0)
MCH: 31.8 pg (ref 26.0–34.0)
MCHC: 33.9 g/dL (ref 32.0–36.0)
MCV: 93.9 fL (ref 80.0–100.0)
PLATELETS: 219 10*3/uL (ref 150–440)
RBC: 3.81 MIL/uL — ABNORMAL LOW (ref 4.40–5.90)
RDW: 13.6 % (ref 11.5–14.5)
WBC: 18.6 10*3/uL — AB (ref 3.8–10.6)

## 2018-01-21 LAB — URINE CULTURE: CULTURE: NO GROWTH

## 2018-01-21 LAB — BASIC METABOLIC PANEL
ANION GAP: 7 (ref 5–15)
BUN: 27 mg/dL — AB (ref 6–20)
CHLORIDE: 113 mmol/L — AB (ref 98–111)
CO2: 24 mmol/L (ref 22–32)
Calcium: 8 mg/dL — ABNORMAL LOW (ref 8.9–10.3)
Creatinine, Ser: 1.64 mg/dL — ABNORMAL HIGH (ref 0.61–1.24)
GFR calc Af Amer: 52 mL/min — ABNORMAL LOW (ref >60)
GFR, EST NON AFRICAN AMERICAN: 45 mL/min — AB (ref >60)
GLUCOSE: 172 mg/dL — AB (ref 70–99)
Potassium: 3.8 mmol/L (ref 3.5–5.1)
Sodium: 144 mmol/L (ref 135–145)

## 2018-01-21 LAB — T3, FREE: T3, Free: 3.7 pg/mL (ref 2.0–4.4)

## 2018-01-21 LAB — ECHOCARDIOGRAM COMPLETE
Height: 70 in
WEIGHTICAEL: 4000 [oz_av]

## 2018-01-21 LAB — MAGNESIUM: Magnesium: 2.1 mg/dL (ref 1.7–2.4)

## 2018-01-21 LAB — TROPONIN I: Troponin I: 0.36 ng/mL (ref <0.03)

## 2018-01-21 LAB — HIV ANTIBODY (ROUTINE TESTING W REFLEX): HIV Screen 4th Generation wRfx: NONREACTIVE

## 2018-01-21 LAB — PHOSPHORUS: Phosphorus: 3 mg/dL (ref 2.5–4.6)

## 2018-01-21 LAB — CALCIUM, IONIZED: Calcium, Ionized, Serum: 4.5 mg/dL (ref 4.5–5.6)

## 2018-01-21 LAB — LACTIC ACID, PLASMA: LACTIC ACID, VENOUS: 1 mmol/L (ref 0.5–1.9)

## 2018-01-21 MED ORDER — FUROSEMIDE 10 MG/ML IJ SOLN
20.0000 mg | Freq: Once | INTRAMUSCULAR | Status: AC
  Administered 2018-01-21: 20 mg via INTRAVENOUS

## 2018-01-21 MED ORDER — FUROSEMIDE 10 MG/ML IJ SOLN
40.0000 mg | Freq: Once | INTRAMUSCULAR | Status: AC
  Administered 2018-01-21: 40 mg via INTRAVENOUS

## 2018-01-21 MED ORDER — ORAL CARE MOUTH RINSE
15.0000 mL | Freq: Two times a day (BID) | OROMUCOSAL | Status: DC
  Administered 2018-01-22 – 2018-01-23 (×3): 15 mL via OROMUCOSAL

## 2018-01-21 MED ORDER — ALPRAZOLAM 1 MG PO TABS
1.0000 mg | ORAL_TABLET | Freq: Every evening | ORAL | Status: DC | PRN
  Administered 2018-01-21 – 2018-01-23 (×2): 1 mg via ORAL
  Filled 2018-01-21: qty 2
  Filled 2018-01-21: qty 1

## 2018-01-21 MED ORDER — GUAIFENESIN-CODEINE 100-10 MG/5ML PO SOLN
10.0000 mL | ORAL | Status: DC | PRN
  Administered 2018-01-21 – 2018-01-24 (×4): 10 mL via ORAL
  Filled 2018-01-21 (×4): qty 10

## 2018-01-21 MED ORDER — LEVOTHYROXINE SODIUM 100 MCG IV SOLR
12.5000 ug | Freq: Every day | INTRAVENOUS | Status: DC
  Administered 2018-01-22: 12.5 ug via INTRAVENOUS
  Filled 2018-01-21 (×2): qty 5

## 2018-01-21 MED ORDER — METOPROLOL TARTRATE 25 MG PO TABS
25.0000 mg | ORAL_TABLET | Freq: Two times a day (BID) | ORAL | Status: DC
  Administered 2018-01-21 – 2018-01-22 (×2): 25 mg via ORAL
  Filled 2018-01-21 (×2): qty 1

## 2018-01-21 MED ORDER — FUROSEMIDE 10 MG/ML IJ SOLN
INTRAMUSCULAR | Status: AC
  Administered 2018-01-21: 20 mg via INTRAVENOUS
  Filled 2018-01-21: qty 2

## 2018-01-21 MED ORDER — FUROSEMIDE 10 MG/ML IJ SOLN
INTRAMUSCULAR | Status: AC
  Filled 2018-01-21: qty 4

## 2018-01-21 MED ORDER — HYDROCODONE-ACETAMINOPHEN 5-325 MG PO TABS: 1.0000 | ORAL_TABLET | ORAL | Status: DC | PRN

## 2018-01-21 MED ORDER — IPRATROPIUM-ALBUTEROL 0.5-2.5 (3) MG/3ML IN SOLN
3.0000 mL | Freq: Four times a day (QID) | RESPIRATORY_TRACT | Status: DC
  Administered 2018-01-22 – 2018-01-23 (×6): 3 mL via RESPIRATORY_TRACT
  Filled 2018-01-21 (×6): qty 3

## 2018-01-21 NOTE — Progress Notes (Signed)
[facility] Cardiology  SUBJECTIVE: Patient extubated, sitting in bed, denies chest pain or shortness of breath   Vitals:   01/21/18 0600 01/21/18 0700 01/21/18 0800 01/21/18 0900  BP: 123/90 (!) 131/97 120/86 113/80  Pulse: 79 79 79 72  Resp: 20 20 (!) 28 19  Temp:  100 F (37.8 C)    TempSrc:  Axillary    SpO2: 99% 98% 93% 99%  Weight:      Height:         Intake/Output Summary (Last 24 hours) at 01/21/2018 1010 Last data filed at 01/21/2018 0900 Gross per 24 hour  Intake 2491.54 ml  Output 1000 ml  Net 1491.54 ml      PHYSICAL EXAM  General: Well developed, well nourished, in no acute distress HEENT:  Normocephalic and atramatic Neck:  No JVD.  Lungs: Clear bilaterally to auscultation and percussion. Heart: HRRR . Normal S1 and S2 without gallops or murmurs.  Abdomen: Bowel sounds are positive, abdomen soft and non-tender  Msk:  Back normal, normal gait. Normal strength and tone for age. Extremities: No clubbing, cyanosis or edema.   Neuro: Alert and oriented X 3. Psych:  Good affect, responds appropriately   LABS: Basic Metabolic Panel: Recent Labs    01/20/18 0231 01/21/18 0536  NA 141 144  K 3.4* 3.8  CL 106 113*  CO2 22 24  GLUCOSE 398* 172*  BUN 14 27*  CREATININE 0.97 1.64*  CALCIUM 8.8* 8.0*  MG 1.9 2.1  PHOS 4.5 3.0   Liver Function Tests: Recent Labs    01/20/18 0231  AST 46*  ALT 36  ALKPHOS 62  BILITOT 0.6  PROT 8.4*  ALBUMIN 3.6   Recent Labs    01/20/18 0231 01/20/18 0634  LIPASE 47  --   AMYLASE  --  91   CBC: Recent Labs    01/20/18 0231 01/21/18 0536  WBC 27.3* 18.6*  NEUTROABS 12.8*  --   HGB 14.7 12.1*  HCT 43.9 35.8*  MCV 95.2 93.9  PLT 284 219   Cardiac Enzymes: Recent Labs    01/20/18 1342 01/20/18 2023 01/21/18 0536  TROPONINI 0.45* 0.42* 0.36*   BNP: Invalid input(s): POCBNP D-Dimer: No results for input(s): DDIMER in the last 72 hours. Hemoglobin A1C: No results for input(s): HGBA1C in the last 72  hours. Fasting Lipid Panel: Recent Labs    01/20/18 0459  CHOL 191  HDL NOT REPORTED DUE TO HIGH TRIGLYCERIDES  LDLCALC UNABLE TO CALCULATE IF TRIGLYCERIDE OVER 400 mg/dL  TRIG 1,658*  CHOLHDL NOT REPORTED DUE TO HIGH TRIGLYCERIDES   Thyroid Function Tests: Recent Labs    01/20/18 0231  TSH 4.696*  T3FREE 3.7   Anemia Panel: No results for input(s): VITAMINB12, FOLATE, FERRITIN, TIBC, IRON, RETICCTPCT in the last 72 hours.  Dg Abd 1 View  Result Date: 01/20/2018 CLINICAL DATA:  Encounter for NG tube placement EXAM: ABDOMEN - 1 VIEW COMPARISON:  01/20/2018 FINDINGS: Malpositioned nasogastric tube, coiled backwards overlying the mid esophagus with tip excluded from view Endotracheal tube overlies the tracheal air column with tip superior to the carina. No pneumothorax. Pacer leads overlie the left ventricular apex. Pulmonary vasculature remains indistinct with cephalization of flow. Suspected trace right-sided pleural effusion. No definite left-sided pleural effusion, though the left costophrenic angle is excluded from view. Nonobstructive bowel gas pattern. No pneumoperitoneum, pneumatosis or portal venous gas. IMPRESSION: 1. Malpositioned nasogastric tube.  Repositioning is advised. 2. Similar findings of pulmonary edema and trace right-sided pleural effusion. 3.  Nonobstructive bowel gas pattern. Electronically Signed   By: Sandi Mariscal M.D.   On: 01/20/2018 14:03   Dg Abd 1 View  Result Date: 01/20/2018 CLINICAL DATA:  NG tube placement EXAM: ABDOMEN - 1 VIEW COMPARISON:  None. FINDINGS: There is no nasogastric tube identified in the lower chest or abdomen. There is mild gaseous distension of the colon and stomach. There is no evidence of pneumoperitoneum, portal venous gas or pneumatosis. There are no pathologic calcifications along the expected course of the ureters. The osseous structures are unremarkable. IMPRESSION: No nasogastric tube identified in the lower chest or abdomen.  Electronically Signed   By: Kathreen Devoid   On: 01/20/2018 12:42   Dg Abd 1 View  Result Date: 01/20/2018 CLINICAL DATA:  Abdominal pain. EXAM: ABDOMEN - 1 VIEW COMPARISON:  None. FINDINGS: A right-sided pleural effusion is suspected layering posteriorly. Pacer paddles are noted on the chest. The bowel gas pattern is unremarkable. Air and stool scattered throughout the colon. A few scattered air-filled small bowel loops. No obvious free air. A right femoral line is noted. Bony structures are unremarkable. IMPRESSION: Suspect right pleural effusion. No plain film findings for an acute abdominal process. Electronically Signed   By: Marijo Sanes M.D.   On: 01/20/2018 11:22   Ct Soft Tissue Neck Wo Contrast  Result Date: 01/20/2018 CLINICAL DATA:  57 y/o  M; thyroid nodule. EXAM: CT NECK WITHOUT CONTRAST TECHNIQUE: Multidetector CT imaging of the neck was performed following the standard protocol without intravenous contrast. COMPARISON:  None. FINDINGS: Pharynx and larynx: Debris within the airways from intubation. Salivary glands: No inflammation, mass, or stone. Thyroid: 5.6 cm nodule within the left lobe of the thyroid. 18 mm nodule in the right lobe of thyroid. Lymph nodes: None enlarged or abnormal density. Vascular: Negative. Limited intracranial: Negative. Visualized orbits: Negative. Mastoids and visualized paranasal sinuses: Mild mucosal thickening of the left maxillary sinus. Normal aeration of the mastoid air cells. Skeleton: No acute or aggressive process. Dental disease with multiple periapical cysts and absent crowns. Mild cervical spondylosis greatest at the C5-6 level. Upper chest: Endotracheal tube tip 3.4 cm above the carina. Moderate bilateral pleural effusions and dependent atelectasis of the lungs. Other: None. IMPRESSION: 1. Thyroid nodules measuring up to 5.6 cm in the left lobe of thyroid. Mass effect from the large left lobe of thyroid nodule displaces the airway rightward. 2.  Endotracheal tube tip 3.4 cm above the carina. 3. Moderate bilateral pleural effusion with dependent atelectasis of the lungs. Electronically Signed   By: Kristine Garbe M.D.   On: 01/20/2018 04:44   Dg Chest Port 1 View  Result Date: 01/21/2018 CLINICAL DATA:  Acute respiratory failure. EXAM: PORTABLE CHEST 1 VIEW COMPARISON:  01/20/2018 FINDINGS: The endotracheal tube is in good position at the mid tracheal level. The lungs demonstrate improved aeration with resolving edema and atelectasis. Probable persistent small layering right pleural effusion. IMPRESSION: Stable position of the endotracheal tube. Improved lung aeration with resolving edema and atelectasis. Electronically Signed   By: Marijo Sanes M.D.   On: 01/21/2018 08:36   Dg Chest Portable 1 View  Result Date: 01/20/2018 CLINICAL DATA:  57 y/o  M; status post intubation. EXAM: PORTABLE CHEST 1 VIEW COMPARISON:  01/20/2018 chest radiograph FINDINGS: Stable cardiomegaly given projection and technique. Interstitial and alveolar pulmonary edema. Probable small effusions. Endotracheal tube tip 4.5 cm above the carina. Transcutaneous pacing pads noted. Bones are unremarkable. IMPRESSION: 1. Endotracheal tube tip 4.5 cm above the carina.  2. Stable cardiomegaly and pulmonary edema. Probable small effusions. Electronically Signed   By: Kristine Garbe M.D.   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 Stable cardiomegaly. Nonaneurysmal thoracic aorta. Diffuse bilateral airspace disease and vascular congestion consistent with pulmonary edema. No significant effusion or pneumothorax. No acute osseous abnormality. IMPRESSION: Stable cardiomegaly with pulmonary edema. Superimposed pneumonia is not entirely excluded but believed less likely.  Electronically Signed   By: Ashley Royalty M.D.   On: 01/20/2018 02:56   Dg Addison Bailey G Tube Plc W/fl W/rad  Result Date: 01/20/2018 CLINICAL DATA:  Patient presents for Dobbhoff tube placement. EXAM: NASO G TUBE PLACEMENT WITH FL AND WITH RAD FLUOROSCOPY TIME:  Fluoroscopy Time:  0.3 minute Radiation Exposure Index (if provided by the fluoroscopic device): 1.3 mGy Number of Acquired Spot Images: 0 COMPARISON:  None. FINDINGS: Multiple attempts at placing a Dobbhoff tube were made through the right and left nare. The tip of the Dobbhoff tube would not progressed beyond the level of the mid trachea on repeated attempts. Attempts were made at advancing the Dobbhoff tube following deflating the tracheostomy cuff, but the Dobbhoff tube could not advance any further. Examination was terminated at this time. IMPRESSION: 1. Multiple unsuccessful attempts at placing a Dobbhoff tube which would not progressed beyond the level of the mid trachea Electronically Signed   By: Kathreen Devoid   On: 01/20/2018 16:41     Echo pending  TELEMETRY: Sinus rhythm:  ASSESSMENT AND PLAN:  Active Problems:   Acute hypoxemic respiratory failure ([place])    1.  Respiratory failure, likely multifactorial, secondary to flash pulmonary edema and possible pneumonia, with acute on chronic diastolic congestive heart failure, secondary to hypertensive emergency, extubated, appears clinically stable, denies chest pain or shortness of breath 2.  Mildly elevated troponin, in the setting of respiratory failure, absence of chest pain or ECG changes, likely demand supply ischemia 3.  Essential hypertension, currently stable  Recommendations  1.  Agree with current therapy 2.  Defer for this anticoagulation 3.  Review 2D echocardiogram 4.  Defer cardiac catheterization at this time   Isaias Cowman, MD, PhD, [facility] 01/21/2018 10:10 AM

## 2018-01-21 NOTE — Progress Notes (Signed)
[place] at [facility]                                                                                                                                                                                  Patient Demographics   [name], is a 57 y.o. male, DOB - 03-16-1961, BXU:[id number]  Admit date - 01/20/2018   Admitting Physician Arta Silence, MD  Outpatient Primary MD for the patient is Marguerita Merles, MD   LOS - 1  Subjective: Patient intubated plan to extubate later   Review of Systems:   CONSTITUTIONAL: Unable to provide on the vent  Vitals:   Vitals:   01/21/18 0900 01/21/18 1000 01/21/18 1100 01/21/18 1200  BP: 113/80   108/73  Pulse: 72 72 71 69  Resp: 19  20 18   Temp: 97.8 F (36.6 C)   97.8 F (36.6 C)  TempSrc: Oral   Oral  SpO2: 99% 98% 100% 96%  Weight:      Height:        Wt Readings from Last 3 Encounters:  01/21/18 104.8 kg (231 lb 0.7 oz)  12/13/17 102.1 kg (225 lb)  11/08/17 108 kg (238 lb)     Intake/Output Summary (Last 24 hours) at 01/21/2018 1227 Last data filed at 01/21/2018 1150 Gross per 24 hour  Intake 2594.87 ml  Output 1425 ml  Net 1169.87 ml    Physical Exam:   GENERAL: Critically ill HEAD, EYES, EARS, NOSE AND THROAT: Atraumatic, normocephalic. Extraocular muscles are intact. Pupils equal and reactive to light. Sclerae anicteric. No conjunctival injection. No oro-pharyngeal erythema.  NECK: Supple. There is no jugular venous distention. No bruits, no lymphadenopathy, no thyromegaly.  HEART: Regular rate and rhythm,. No murmurs, no rubs, no clicks.  LUNGS: On the vent ABDOMEN: Soft, flat, nontender, nondistended. Has good bowel sounds. No hepatosplenomegaly appreciated.  EXTREMITIES: No evidence of any cyanosis, clubbing, or peripheral edema.  +2 pedal and radial pulses bilaterally.  NEUROLOGIC: Follow, commands  sKIN: Moist and warm with no rashes appreciated.  Diaphoretic Psych: Not  anxious, depressed LN: No inguinal LN enlargement    Antibiotics   Anti-infectives (From admission, onward)   Start     Dose/Rate Route Frequency Ordered Stop   01/20/18 1200  ceFEPIme (MAXIPIME) 2 g in sodium chloride 0.9 % 100 mL IVPB     2 g 200 mL/hr over 30 Minutes Intravenous Every 8 hours 01/20/18 0618     01/20/18 1200  vancomycin (VANCOCIN) IVPB 1000 mg/200 mL premix  Status:  Discontinued     1,000 mg 200 mL/hr over 60 Minutes Intravenous Every 8 hours 01/20/18 0649 01/21/18 1216  01/20/18 0330  ceFEPIme (MAXIPIME) 2 g in sodium chloride 0.9 % 100 mL IVPB     2 g 200 mL/hr over 30 Minutes Intravenous  Once 01/20/18 0321 01/20/18 0517   01/20/18 0330  vancomycin (VANCOCIN) IVPB 1000 mg/200 mL premix     1,000 mg 200 mL/hr over 60 Minutes Intravenous  Once 01/20/18 0321 01/20/18 0724      Medications   Scheduled Meds: . aspirin  81 mg Oral Daily  . atorvastatin  40 mg Oral q1800  . chlorhexidine gluconate (MEDLINE KIT)  15 mL Mouth Rinse BID  . Chlorhexidine Gluconate Cloth  6 each Topical Q0600  . enoxaparin (LOVENOX) injection  1 mg/kg Subcutaneous Q12H  . furosemide      . insulin aspart  0-15 Units Subcutaneous Q4H  . insulin glargine  10 Units Subcutaneous Daily  . mouth rinse  15 mL Mouth Rinse BID  . midazolam  2 mg Intravenous Once  . mupirocin ointment  1 application Nasal BID   Continuous Infusions: . sodium chloride    . ceFEPime (MAXIPIME) IV 2 g (01/21/18 1153)  . famotidine (PEPCID) IV Stopped (01/21/18 1152)  . phenylephrine (NEO-SYNEPHRINE) Adult infusion Stopped (01/21/18 0730)  . potassium chloride Stopped (01/20/18 1300)   PRN Meds:.sodium chloride, acetaminophen, bisacodyl, fentaNYL (SUBLIMAZE) injection, ondansetron (ZOFRAN) IV, potassium chloride, senna-docusate   Data Review:   Micro Results Recent Results (from the past 240 hour(s))  Blood Culture (routine x 2)     Status: None (Preliminary result)   Collection Time: 01/20/18  3:25  AM  Result Value Ref Range Status   Specimen Description BLOOD LEFT WRIST  Final   Special Requests   Final    BOTTLES DRAWN AEROBIC AND ANAEROBIC Blood Culture adequate volume   Culture   Final    NO GROWTH 1 DAY Performed at [facility], [address] [place], [place] [address]    Report Status PENDING  Incomplete  Blood Culture (routine x 2)     Status: None (Preliminary result)   Collection Time: 01/20/18  3:25 AM  Result Value Ref Range Status   Specimen Description BLOOD LEFT HAND   Final   Special Requests   Final    BOTTLES DRAWN AEROBIC AND ANAEROBIC Blood Culture adequate volume   Culture   Final    NO GROWTH 1 DAY Performed at [facility], [address] [place], [place] [address]    Report Status PENDING  Incomplete  Urine culture     Status: None   Collection Time: 01/20/18  3:25 AM  Result Value Ref Range Status   Specimen Description   Final    URINE, CATHETERIZED Performed at [facility], [address], [place], [place] [address]    Special Requests   Final    NONE Performed at [facility], [address], [place], [place] [address]    Culture   Final    NO GROWTH Performed at [facility], [place] [address], [place], [place] [address]    Report Status 01/21/2018 FINAL  Final  MRSA PCR Screening     Status: Abnormal   Collection Time: 01/20/18  3:38 AM  Result Value Ref Range Status   MRSA by PCR POSITIVE (A) NEGATIVE Final    Comment:        The GeneXpert MRSA Assay (FDA approved for NASAL specimens only), is one component of a comprehensive MRSA colonization surveillance program. It is not intended to diagnose  MRSA infection nor to guide or monitor treatment for MRSA infections. RESULT CALLED TO, READ BACK BY AND VERIFIED WITH: CHARLI FLEETWOOD AT 0700 01/20/18 SDR Performed at [facility], [address] [place], [place] [address]   Culture, respiratory (NON-Expectorated)      Status: None (Preliminary result)   Collection Time: 01/20/18  8:27 AM  Result Value Ref Range Status   Specimen Description   Final    TRACHEAL ASPIRATE Performed at [facility], [address], [place], [place] [address]    Special Requests   Final    NONE Performed at [facility], [address] [place], [place] [address]    Gram Stain   Final    RARE WBC PRESENT, PREDOMINANTLY PMN RARE GRAM POSITIVE COCCI FEW GRAM POSITIVE RODS    Culture   Final    CULTURE REINCUBATED FOR BETTER GROWTH Performed at [facility], [place] [address], [place], [place] [address]    Report Status PENDING  Incomplete    Radiology Reports Dg Abd 1 View  Result Date: 01/20/2018 CLINICAL DATA:  Encounter for NG tube placement EXAM: ABDOMEN - 1 VIEW COMPARISON:  01/20/2018 FINDINGS: Malpositioned nasogastric tube, coiled backwards overlying the mid esophagus with tip excluded from view Endotracheal tube overlies the tracheal air column with tip superior to the carina. No pneumothorax. Pacer leads overlie the left ventricular apex. Pulmonary vasculature remains indistinct with cephalization of flow. Suspected trace right-sided pleural effusion. No definite left-sided pleural effusion, though the left costophrenic angle is excluded from view. Nonobstructive bowel gas pattern. No pneumoperitoneum, pneumatosis or portal venous gas. IMPRESSION: 1. Malpositioned nasogastric tube.  Repositioning is advised. 2. Similar findings of pulmonary edema and trace right-sided pleural effusion. 3. Nonobstructive bowel gas pattern. Electronically Signed   By: Sandi Mariscal M.D.   On: 01/20/2018 14:03   Dg Abd 1 View  Result Date: 01/20/2018 CLINICAL DATA:  NG tube placement EXAM: ABDOMEN - 1 VIEW COMPARISON:  None. FINDINGS: There is no nasogastric tube identified in the lower chest or abdomen. There is mild gaseous distension of the colon and stomach. There is no evidence of pneumoperitoneum, portal  venous gas or pneumatosis. There are no pathologic calcifications along the expected course of the ureters. The osseous structures are unremarkable. IMPRESSION: No nasogastric tube identified in the lower chest or abdomen. Electronically Signed   By: Kathreen Devoid   On: 01/20/2018 12:42   Dg Abd 1 View  Result Date: 01/20/2018 CLINICAL DATA:  Abdominal pain. EXAM: ABDOMEN - 1 VIEW COMPARISON:  None. FINDINGS: A right-sided pleural effusion is suspected layering posteriorly. Pacer paddles are noted on the chest. The bowel gas pattern is unremarkable. Air and stool scattered throughout the colon. A few scattered air-filled small bowel loops. No obvious free air. A right femoral line is noted. Bony structures are unremarkable. IMPRESSION: Suspect right pleural effusion. No plain film findings for an acute abdominal process. Electronically Signed   By: Marijo Sanes M.D.   On: 01/20/2018 11:22   Ct Soft Tissue Neck Wo Contrast  Result Date: 01/20/2018 CLINICAL DATA:  57 y/o  M; thyroid nodule. EXAM: CT NECK WITHOUT CONTRAST TECHNIQUE: Multidetector CT imaging of the neck was performed following the standard protocol without intravenous contrast. COMPARISON:  None. FINDINGS: Pharynx and larynx: Debris within the airways from intubation. Salivary glands: No inflammation, mass, or stone. Thyroid: 5.6 cm nodule within the left lobe of the thyroid. 18 mm nodule in the right lobe of thyroid.  Lymph nodes: None enlarged or abnormal density. Vascular: Negative. Limited intracranial: Negative. Visualized orbits: Negative. Mastoids and visualized paranasal sinuses: Mild mucosal thickening of the left maxillary sinus. Normal aeration of the mastoid air cells. Skeleton: No acute or aggressive process. Dental disease with multiple periapical cysts and absent crowns. Mild cervical spondylosis greatest at the C5-6 level. Upper chest: Endotracheal tube tip 3.4 cm above the carina. Moderate bilateral pleural effusions and  dependent atelectasis of the lungs. Other: None. IMPRESSION: 1. Thyroid nodules measuring up to 5.6 cm in the left lobe of thyroid. Mass effect from the large left lobe of thyroid nodule displaces the airway rightward. 2. Endotracheal tube tip 3.4 cm above the carina. 3. Moderate bilateral pleural effusion with dependent atelectasis of the lungs. Electronically Signed   By: Kristine Garbe M.D.   On: 01/20/2018 04:44   Dg Chest Port 1 View  Result Date: 01/21/2018 CLINICAL DATA:  Acute respiratory failure. EXAM: PORTABLE CHEST 1 VIEW COMPARISON:  01/20/2018 FINDINGS: The endotracheal tube is in good position at the mid tracheal level. The lungs demonstrate improved aeration with resolving edema and atelectasis. Probable persistent small layering right pleural effusion. IMPRESSION: Stable position of the endotracheal tube. Improved lung aeration with resolving edema and atelectasis. Electronically Signed   By: Marijo Sanes M.D.   On: 01/21/2018 08:36   Dg Chest Portable 1 View  Result Date: 01/20/2018 CLINICAL DATA:  57 y/o  M; status post intubation. EXAM: PORTABLE CHEST 1 VIEW COMPARISON:  01/20/2018 chest radiograph FINDINGS: Stable cardiomegaly given projection and technique. Interstitial and alveolar pulmonary edema. Probable small effusions. Endotracheal tube tip 4.5 cm above the carina. Transcutaneous pacing pads noted. Bones are unremarkable. IMPRESSION: 1. Endotracheal tube tip 4.5 cm above the carina. 2. Stable cardiomegaly and pulmonary edema. Probable small effusions. Electronically Signed   By: Kristine Garbe M.D.   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 Stable cardiomegaly. Nonaneurysmal thoracic aorta. Diffuse bilateral airspace disease and vascular congestion consistent with  pulmonary edema. No significant effusion or pneumothorax. No acute osseous abnormality. IMPRESSION: Stable cardiomegaly with pulmonary edema. Superimposed pneumonia is not entirely excluded but believed less likely. Electronically Signed   By: Ashley Royalty M.D.   On: 01/20/2018 02:56   Dg Addison Bailey G Tube Plc W/fl W/rad  Result Date: 01/20/2018 CLINICAL DATA:  Patient presents for Dobbhoff tube placement. EXAM: NASO G TUBE PLACEMENT WITH FL AND WITH RAD FLUOROSCOPY TIME:  Fluoroscopy Time:  0.3 minute Radiation Exposure Index (if provided by the fluoroscopic device): 1.3 mGy Number of Acquired Spot Images: 0 COMPARISON:  None. FINDINGS: Multiple attempts at placing a Dobbhoff tube were made through the right and left nare. The tip of the Dobbhoff tube would not progressed beyond the level of the mid trachea on repeated attempts. Attempts were made at advancing the Dobbhoff tube following deflating the tracheostomy cuff, but the Dobbhoff tube could not advance any further. Examination was terminated at this time. IMPRESSION: 1. Multiple unsuccessful attempts at placing a Dobbhoff tube which would not progressed beyond the level of the mid trachea Electronically Signed   By: Kathreen Devoid   On: 01/20/2018 16:41   US Thyroid  Result Date: 12/22/2017 CLINICAL DATA:  Incidental on CT. EXAM: THYROID ULTRASOUND TECHNIQUE: Ultrasound examination of the thyroid gland and adjacent soft tissues was performed.  COMPARISON:  None. FINDINGS: Parenchymal Echotexture: Mildly heterogenous Isthmus: 0.3 cm Right lobe: 5.1 x 2.0 x 2.1 cm Left lobe: 6.2 x 4.6 x 3.7 cm _________________________________________________________ Estimated total number of nodules >/= 1 cm: 2 Number of spongiform nodules >/=  2 cm not described below (TR1): 0 Number of mixed cystic and solid nodules >/= 1.5 cm not described below (TR2): 0 _________________________________________________________ Nodule # 1: Location: Right; Mid Maximum size: 2.6 cm; Other 2  dimensions: 1.5 x 1.5 cm Composition: solid/almost completely solid (2) Echogenicity: isoechoic (1) Shape: not taller-than-wide (0) Margins: smooth (0) Echogenic foci: none (0) ACR TI-RADS total points: 3. ACR TI-RADS risk category: TR3 (3 points). ACR TI-RADS recommendations: **Given size (>/= 2.5 cm) and appearance, fine needle aspiration of this mildly suspicious nodule should be considered based on TI-RADS criteria. _________________________________________________________ Nodule # 2: Location: Left; Mid Maximum size: 5.8 cm; Other 2 dimensions: 4.0 x 3.1 cm Composition: solid/almost completely solid (2) Echogenicity: isoechoic (1) Shape: not taller-than-wide (0) Margins: smooth (0) Echogenic foci: none (0) ACR TI-RADS total points: 3. ACR TI-RADS risk category: TR3 (3 points). ACR TI-RADS recommendations: **Given size (>/= 2.5 cm) and appearance, fine needle aspiration of this mildly suspicious nodule should be considered based on TI-RADS criteria. _________________________________________________________ IMPRESSION: Nodules 1 and 2 both meet criteria for fine needle aspiration biopsy. The above is in keeping with the ACR TI-RADS recommendations - J Am Coll Radiol 2017;14:587-595. Electronically Signed   By: Marybelle Killings M.D.   On: 12/22/2017 16:10     CBC Recent Labs  Lab 01/20/18 0231 01/21/18 0536  WBC 27.3* 18.6*  HGB 14.7 12.1*  HCT 43.9 35.8*  PLT 284 219  MCV 95.2 93.9  MCH 32.0 31.8  MCHC 33.6 33.9  RDW 13.5 13.6  LYMPHSABS 11.5*  --   MONOABS 2.7*  --   EOSABS 0.3  --   BASOSABS 0.0  --     Chemistries  Recent Labs  Lab 01/20/18 0231 01/21/18 0536  NA 141 144  K 3.4* 3.8  CL 106 113*  CO2 22 24  GLUCOSE 398* 172*  BUN 14 27*  CREATININE 0.97 1.64*  CALCIUM 8.8* 8.0*  MG 1.9 2.1  AST 46*  --   ALT 36  --   ALKPHOS 62  --   BILITOT 0.6  --     ------------------------------------------------------------------------------------------------------------------ estimated creatinine clearance is 60.2 mL/min (A) (by C-G formula based on SCr of 1.64 mg/dL (H)). ------------------------------------------------------------------------------------------------------------------ No results for input(s): HGBA1C in the last 72 hours. ------------------------------------------------------------------------------------------------------------------ Recent Labs    01/20/18 0459  CHOL 191  HDL NOT REPORTED DUE TO HIGH TRIGLYCERIDES  LDLCALC UNABLE TO CALCULATE IF TRIGLYCERIDE OVER 400 mg/dL  TRIG 1,658*  CHOLHDL NOT REPORTED DUE TO HIGH TRIGLYCERIDES   ------------------------------------------------------------------------------------------------------------------ Recent Labs    01/20/18 0231  TSH 4.696*  T3FREE 3.7   ------------------------------------------------------------------------------------------------------------------ No results for input(s): VITAMINB12, FOLATE, FERRITIN, TIBC, IRON, RETICCTPCT in the last 72 hours.  Coagulation profile Recent Labs  Lab 01/20/18 0231  INR 0.91    No results for input(s): DDIMER in the last 72 hours.  Cardiac Enzymes Recent Labs  Lab 01/20/18 1342 01/20/18 2023 01/21/18 0536  TROPONINI 0.45* 0.42* 0.36*   ------------------------------------------------------------------------------------------------------------------ Invalid input(s): POCBNP    Assessment & Plan   Patient is 57 year old with acute respiratory failure  1.  Acute respiratory failure multifactorial status post intubation possibly due to acute  systolic CHF with bilateral pleural effusion Continue ventilator support plan for extubation Echo shows significant  systolic dysfunction Continue IV antibiotics  2.  Diabetes type 2 continue Lantus and sliding scale insulin  3.  Hypertension continue Cozaar  4.   Elevated troponin likely due to demand ischemia follow cardiac enzymes echocardiogram of the heart pending  5.  Sleep apnea  6.  Thyroid mass recommend checking free T3 and free T4  7,Lovenox for DVT prophylaxis     Code Status Orders  (From admission, onward)        Start     Ordered   01/20/18 0415  Full code  Continuous     01/20/18 0414    Code Status History    This patient has a current code status but no historical code status.    Advance Directive Documentation     Most Recent Value  Type of Advance Directive  Healthcare Power of Attorney, Living will  Pre-existing out of facility DNR order (yellow form or pink MOST form)  -  "MOST" Form in Place?  -           Consults  intestivist  DVT Prophylaxis   lovenox  Lab Results  Component Value Date   PLT 219 01/21/2018     Time Spent in minutes  57mn Greater than 50% of time spent in care coordination and counseling patient regarding the condition and plan of care.   SDustin FlockM.D on 01/21/2018 at 12:27 PM  Between 7am to 6pm - Pager - [phone]  After 6pm go to [email or web address] - [employer]   Office  [phone]

## 2018-01-21 NOTE — Progress Notes (Signed)
Pt woke up and asked for the remote.  Sedation of precedex increased to max dosing at this time.  Pt back comfortably asleep.  When pt awake, able to follow commands and not pulling at any tubes.  Will continue to monitor.

## 2018-01-21 NOTE — Progress Notes (Signed)
Pharmacy Antibiotic Note  [name] is a 57 y.o. male admitted on 01/20/2018 with pneumonia.  Pharmacy has been consulted for vanc/cefepime dosing. Patient received vanc 1g and cefepime 2g IV x 1.  Plan: Will continue vanc 1g IV q8h w/ 6 hour stack  Will draw vanc trough 07/20 @ 1100 prior to 4th dose. Will continue cefepime 2g IV q8h.  Ke 0.108 T1/2 6 ~ 8 hrs Goal trough 15 - 20 mcg/mL  7/20 ~ 12:15  VT = 23. Called RN to stop dose that was currently infusing. Patient probably received 500mg .   Ordered at level for 07/21 at 00:30 to determine further dosing.   Height: 5\' 10"  (177.8 cm) Weight: 231 lb 0.7 oz (104.8 kg) IBW/kg (Calculated) : 73  Temp (24hrs), Avg:98.5 F (36.9 C), Min:97.8 F (36.6 C), Max:100 F (37.8 C)  Recent Labs  Lab 01/20/18 0231 01/20/18 0635 01/21/18 0536 01/21/18 1146  WBC 27.3*  --  18.6*  --   CREATININE 0.97  --  1.64*  --   LATICACIDVEN 5.4* 2.0* 1.0  --   VANCOTROUGH  --   --   --  23*    Estimated Creatinine Clearance: 60.2 mL/min (A) (by C-G formula based on SCr of 1.64 mg/dL (H)).    Allergies  Allergen Reactions  . Enalapril Maleate Anaphylaxis  . Iodinated Diagnostic Agents Itching  . Isosorbide Mononitrate Er [Isosorbide Dinitrate] Other (See Comments)    "made me not be able to think or function"  . Canagliflozin Other (See Comments)    Thank you for allowing pharmacy to be a part of this patient's care.  Olivia Canter, [facility] Clinical Pharmacist 01/21/2018

## 2018-01-21 NOTE — Progress Notes (Signed)
Patient awake and able to communicate needs. Turned off sedation and RT placed patient on spontaneous pressure support for 45 minutes. VS stable. Patient tolerated pressure support and MD gave order to extubate patient. RT extubated patient and placed him on 4L [place]. Patient still alert and able to communicate needs. Will continue to monitor.

## 2018-01-21 NOTE — Progress Notes (Signed)
Patient is extubated on high fowlers position with cuff deflated and after suctioning endotracheally and orally. Placed on 4 lpm o2 [place]

## 2018-01-21 NOTE — Evaluation (Signed)
Clinical/Bedside Swallow Evaluation Patient Details  Name: [name] MRN: [id number] Date of Birth: 01/11/1961  Today's Date: 01/21/2018 Time: SLP Start Time (ACUTE ONLY): 1215 SLP Stop Time (ACUTE ONLY): 1258 SLP Time Calculation (min) (ACUTE ONLY): 43 min  Past Medical History:  Past Medical History:  Diagnosis Date  . Chest pain 2019  . Diabetes mellitus without complication ([place])   . Hypertension   . Sleep apnea    Past Surgical History:  Past Surgical History:  Procedure Laterality Date  . COLONOSCOPY  2017  . LIPOMA EXCISION     Left forehead   HPI:      Assessment / Plan / Recommendation Clinical Impression  Pt presents with a minimal risk for aspiration as evidenced by adequate oral phase mastication and within functional limits a-p transfer. pt did have double portion intake bolus trials via spoon. slp educated pt to take half sized bites and pt stated that this was his tpical bite size. slp educated on risks with large bolus trials and pt stated understanding and agreed to attempt smaller bites to reduce risk of aspiration. slp guarded in pt efforts as he stated his wife has encouraged smaller bits previously and often.  pt had no overt ssx aspiration with solids or liquids and had no coughing that appeared associated with foods. pt had coughing prior to trials likely associated with respiratory failure/ possible chf diagnosis. pt did not have noted watery eyes or wet vocal quality but did have noted belching throughout the session. slp spoke to nsg who stated he is on pepcid and GI has not been consulted. he may bennefit from GI consult.   SLP recommends dys 3 with thin liquids at this time.  SLP Visit Diagnosis: Dysphagia, oropharyngeal phase (R13.12)    Aspiration Risk  Mild aspiration risk    Diet Recommendation Dysphagia 3 (Mech soft);Thin liquid   Liquid Administration via: Straw;Cup Medication Administration: Whole meds with liquid Supervision: Patient able  to self feed Compensations: Slow rate;Small sips/bites;Follow solids with liquid Postural Changes: Seated upright at 90 degrees    Other  Recommendations Oral Care Recommendations: Patient independent with oral care   Follow up Recommendations None      Frequency and Duration min 2x/week  1 week       Prognosis Prognosis for Safe Diet Advancement: Good      Swallow Study   General Date of Onset: 01/19/18 Type of Study: Bedside Swallow Evaluation Diet Prior to this Study: NPO Temperature Spikes Noted: No Respiratory Status: Nasal cannula History of Recent Intubation: No Behavior/Cognition: Alert;Cooperative;Pleasant mood Oral Cavity Assessment: Within Functional Limits Oral Care Completed by SLP: No Oral Cavity - Dentition: Adequate natural dentition Vision: Functional for self-feeding Self-Feeding Abilities: Able to feed self Patient Positioning: Upright in chair Baseline Vocal Quality: Normal Volitional Cough: Strong Volitional Swallow: Able to elicit    Oral/Motor/Sensory Function Overall Oral Motor/Sensory Function: Within functional limits   Ice Chips Ice chips: Within functional limits Presentation: Spoon;Self Fed   Thin Liquid Thin Liquid: Within functional limits Presentation: Straw;Self Fed;Cup    Nectar Thick Nectar Thick Liquid: Not tested   Honey Thick Honey Thick Liquid: Not tested   Puree Puree: Within functional limits Presentation: Self Fed;Spoon   Solid   GO   Solid: Within functional limits Presentation: Self Fed;Spoon        [place] Sauber 01/21/2018,1:42 PM

## 2018-01-21 NOTE — Progress Notes (Signed)
PULMONARY / CRITICAL CARE MEDICINE   Name: [name] MRN: [id number] DOB: 1960/12/27    ADMISSION DATE:  01/20/2018 CONSULTATION DATE: 01/20/2018  REFERRING MD: Dr. Jodell Cipro   CHIEF COMPLAINT: Shortness of Breath   HISTORY OF PRESENT ILLNESS:   This is a 57 yo male with a PMH of OSA, HTN, Diabetes Mellitus, Multinodular Goiter, Chest Pain, and Current Everyday Smoker.  He presented to [facility] ER on 07/19 via EMS with shortness of breath, bilateral lower extremity edema, orthopnea, non-productive cough, and insomnia onset of symptoms 2 days prior to presentation. According to pts wife he has also had poor po intake for 1 month requiring hospitalization for treatment of pancreatitis.  EMS reported when they arrived at pts home they were unable to obtain an O2 sat and systolic bp greater than 660.  Per ER notes upon arrival to the ER pt gasping, severely diaphoretic, heart rate 140's,  O2 sats 60%, and pt shouting he could not breath.  He also had brief run of ventricular tachycardia, zoll pads applied.  He was subsequently placed on Bipap to pre oxygenate prior to intubation, nitroglycerin drip initiated at 100 mcg/min, and pt received iv ativan for agitation. CXR revealed deviated trachea to the right secondary to known left thyromegaly, pulmonary edema and possible pneumonia.  Despite treatment pts respiratory failure did not improve requiring mechanical intubation.  During intubation pt required frequent suction due to copious secretions.  Lab results revealed K+ 3.4, glucose 398, AST 46, BNP 593, troponin 0.06, lactic acid 5.4, wbc 27.3, and abg pH 7.13, pCO2 62, PO2 76, acid-base deficit 9.6, and bicarb 20.6. He received 40 mg iv lasix x1 dose and sepsis protocol initiated he received iv abx, however unable to administer iv fluids due to flash pulmonary edema. He was subsequently admitted to ICU by hospitalist team for further workup and treatment PCCM consulted for vent management.   During  previous hospitalization 12/2017 CXR showed tracheal deviation suggesting thyroid goiter, therefore CT Chest ordered revealing thyroid nodule. Following hospitalization pt followed up with his PCP who ordered an US Neck confirming multinodular goiter on 12/22/17  (right lobe 5.1  x 2.0 x 2.1 cm and left lobe 6.2 x .3.7 cm). He was referred to Endocrinology for evaluation initial office visit 01/09/2018 with recommendation of fine needle aspiration biopsy scheduled on 02/08/18, if malignant pt would undergo surgery to remove the nodules. He also endorsed nocturnal dyspnea, therefore per endocrinologist if biopsy results benign pt to undergo breathing test to determine if nodules were contributing factor, if so pt would require surgery for nodule removal.  REVIEW OF SYSTEMS:   Unable to assess pt intubated   SUBJECTIVE:  Unable to assess pt intubated  VITAL SIGNS: BP 113/80   Pulse 72   Temp 100 F (37.8 C) (Axillary)   Resp 19   Ht 5\' 10"  (1.778 m)   Wt 231 lb 0.7 oz (104.8 kg)   SpO2 99%   BMI 33.15 kg/m   HEMODYNAMICS:  no compromise  VENTILATOR SETTINGS: Vent Mode: PSV FiO2 (%):  [30 %-40 %] 30 % Set Rate:  [20 bmp] 20 bmp Vt Set:  [550 mL] 550 mL PEEP:  [5 cmH20-8 cmH20] 5 cmH20 Pressure Support:  [5 cmH20] 5 cmH20 Plateau Pressure:  [20 cmH20-22 cmH20] 20 cmH20  INTAKE / OUTPUT: I/O last 3 completed shifts: In: 2209.5 [I.V.:1238.6; IV Piggyback:970.8] Out: 950 [Urine:950]  PHYSICAL EXAMINATION: General: acutely ill appearing male, NAD mechanically intubated  Neuro: agitated, opens eyes  to voice, PERRL HEENT: mild JVD  Cardiovascular: sinus tach, no R/G, diaphorectic but wife reports this to be patient's norm Lungs: diffuse crackles throughout, slightly tachypneic  Abdomen: +BS x4, obese, soft, non distended  Musculoskeletal: normal bulk and tone Skin: intact no lesions or rashes present   LABS:  BMET Recent Labs  Lab 01/20/18 0231 01/21/18 0536  NA 141 144  K  3.4* 3.8  CL 106 113*  CO2 22 24  BUN 14 27*  CREATININE 0.97 1.64*  GLUCOSE 398* 172*    Electrolytes Recent Labs  Lab 01/20/18 0231 01/21/18 0536  CALCIUM 8.8* 8.0*  MG 1.9 2.1  PHOS 4.5 3.0    CBC Recent Labs  Lab 01/20/18 0231 01/21/18 0536  WBC 27.3* 18.6*  HGB 14.7 12.1*  HCT 43.9 35.8*  PLT 284 219    Coag's Recent Labs  Lab 01/20/18 0231  INR 0.91    Sepsis Markers Recent Labs  Lab 01/20/18 0231 01/20/18 0635 01/21/18 0536  LATICACIDVEN 5.4* 2.0* 1.0  PROCALCITON <0.10  --   --     ABG Recent Labs  Lab 01/20/18 0401 01/20/18 0807  PHART 7.28* 7.43  PCO2ART 54* 34  PO2ART 133* 395*    Liver Enzymes Recent Labs  Lab 01/20/18 0231  AST 46*  ALT 36  ALKPHOS 62  BILITOT 0.6  ALBUMIN 3.6    Cardiac Enzymes Recent Labs  Lab 01/20/18 1342 01/20/18 2023 01/21/18 0536  TROPONINI 0.45* 0.42* 0.36*    Glucose Recent Labs  Lab 01/20/18 0439 01/20/18 0711 01/20/18 1136 01/20/18 1802 01/20/18 1930 01/21/18 0018  GLUCAP 348* 310* 171* 159* 180* 161*    Imaging Dg Abd 1 View  Result Date: 01/20/2018 CLINICAL DATA:  Encounter for NG tube placement EXAM: ABDOMEN - 1 VIEW COMPARISON:  01/20/2018 FINDINGS: Malpositioned nasogastric tube, coiled backwards overlying the mid esophagus with tip excluded from view Endotracheal tube overlies the tracheal air column with tip superior to the carina. No pneumothorax. Pacer leads overlie the left ventricular apex. Pulmonary vasculature remains indistinct with cephalization of flow. Suspected trace right-sided pleural effusion. No definite left-sided pleural effusion, though the left costophrenic angle is excluded from view. Nonobstructive bowel gas pattern. No pneumoperitoneum, pneumatosis or portal venous gas. IMPRESSION: 1. Malpositioned nasogastric tube.  Repositioning is advised. 2. Similar findings of pulmonary edema and trace right-sided pleural effusion. 3. Nonobstructive bowel gas pattern.  Electronically Signed   By: Sandi Mariscal M.D.   On: 01/20/2018 14:03   Dg Abd 1 View  Result Date: 01/20/2018 CLINICAL DATA:  NG tube placement EXAM: ABDOMEN - 1 VIEW COMPARISON:  None. FINDINGS: There is no nasogastric tube identified in the lower chest or abdomen. There is mild gaseous distension of the colon and stomach. There is no evidence of pneumoperitoneum, portal venous gas or pneumatosis. There are no pathologic calcifications along the expected course of the ureters. The osseous structures are unremarkable. IMPRESSION: No nasogastric tube identified in the lower chest or abdomen. Electronically Signed   By: Kathreen Devoid   On: 01/20/2018 12:42   Dg Abd 1 View  Result Date: 01/20/2018 CLINICAL DATA:  Abdominal pain. EXAM: ABDOMEN - 1 VIEW COMPARISON:  None. FINDINGS: A right-sided pleural effusion is suspected layering posteriorly. Pacer paddles are noted on the chest. The bowel gas pattern is unremarkable. Air and stool scattered throughout the colon. A few scattered air-filled small bowel loops. No obvious free air. A right femoral line is noted. Bony structures are unremarkable. IMPRESSION: Suspect right  pleural effusion. No plain film findings for an acute abdominal process. Electronically Signed   By: Marijo Sanes M.D.   On: 01/20/2018 11:22   Dg Chest Port 1 View  Result Date: 01/21/2018 CLINICAL DATA:  Acute respiratory failure. EXAM: PORTABLE CHEST 1 VIEW COMPARISON:  01/20/2018 FINDINGS: The endotracheal tube is in good position at the mid tracheal level. The lungs demonstrate improved aeration with resolving edema and atelectasis. Probable persistent small layering right pleural effusion. IMPRESSION: Stable position of the endotracheal tube. Improved lung aeration with resolving edema and atelectasis. Electronically Signed   By: Marijo Sanes M.D.   On: 01/21/2018 08:36   Dg Addison Bailey G Tube Plc W/fl W/rad  Result Date: 01/20/2018 CLINICAL DATA:  Patient presents for Dobbhoff tube  placement. EXAM: NASO G TUBE PLACEMENT WITH FL AND WITH RAD FLUOROSCOPY TIME:  Fluoroscopy Time:  0.3 minute Radiation Exposure Index (if provided by the fluoroscopic device): 1.3 mGy Number of Acquired Spot Images: 0 COMPARISON:  None. FINDINGS: Multiple attempts at placing a Dobbhoff tube were made through the right and left nare. The tip of the Dobbhoff tube would not progressed beyond the level of the mid trachea on repeated attempts. Attempts were made at advancing the Dobbhoff tube following deflating the tracheostomy cuff, but the Dobbhoff tube could not advance any further. Examination was terminated at this time. IMPRESSION: 1. Multiple unsuccessful attempts at placing a Dobbhoff tube which would not progressed beyond the level of the mid trachea Electronically Signed   By: Kathreen Devoid   On: 01/20/2018 16:41   STUDIES:  CT Soft Tissue Neck 07/19>>Thyroid nodules measuring up to 5.6 cm in the left lobe of thyroid. Mass effect from the large left lobe of thyroid nodule displaces the airway rightward. Endotracheal tube tip 3.4 cm above the carina. Moderate bilateral pleural effusion with dependent atelectasis of the lungs. CT Chest High Resolution 07/19>>  CULTURES/TEST: Sputum 07/19>> Blood x2 07/19>> Urine 07/19>> MRSA PCR 07/19>> Strep pneumoniae urinary antigen 07/19>> Legionella pneumophilia serogp UR Ag 07/19>>  ANTIBIOTICS: Cefepime 07/19>> Vancomycin 07/19>>  SIGNIFICANT EVENTS: 07/19 Pt admitted to ICU mechanically intubated   LINES/TUBES: ETT 07/19>> Right femoral CVL 07/19>>  ASSESSMENT / PLAN:  PULMONARY A: Acute hypoxic hypercapnic respiratory failure secondary to pulmonary edema and possible pneumonia  Multinodular goiter with tracheal deviation  Mechanical Intubation  Hx: OSA and Current Everyday Smoker P:   Vent wean and extubate today Cont on ABx for now Cont on bronchodilators Keep patient euvolemic- will give 1 dose of lasix   CARDIOVASCULAR A:   Sinus tachycardia Hypertension-resolved  Hypotension- resolved Elevated troponin likely secondary to demand ischemia in setting of respiratory failure  P:  Continuous telemetry monitoring Echo pending   RENAL A: AKI   Hypokalemia- resolved Lactic acidosis - resolved P:   Replace electrolytes as indicated  Monitor UOP  GASTROINTESTINAL A:   Abdominal pain Transaminasemia (AST 46) P:   Keep NPO for now SUP px: iv pepcid  HEMATOLOGIC A:   No acute issues  P:  VTE px: subq lovenox Trend CBC  Monitor for s/sx of bleeding  Transfuse for hgb <7  INFECTIOUS A:   Possible Pneumonia  P:   Trend WBC and monitor fever curve Trend PCT and lactic acid Follow cultures  Continue abx for now   ENDOCRINE A:   Multinodular goiter with tracheal deviation  Diabetes Mellitus  P:   For follow up of thyroid- FNB as out patient Target glucose management  NEUROLOGIC A:  Mechanical intubation discomfort/pain P:   No active issues; wife reports patient has excessive sweating for 17 years.   FAMILY  - Updates: Pts wife updated regarding plan of care and all questions answered.  Thank you for allowing me the privilege to care for this patient.  I have dedicated a total of 38 minutes in critical care time minus all appropriate exclusions.     Cammie Sickle, MD Pulmonary/Critical Care Pager [phone] (please enter 7 digits) [employer] Pager [phone] (please enter 7 digits)

## 2018-01-22 ENCOUNTER — Encounter
Admission: EM | Disposition: A | Discharge status: Home / Self Care | Payer: Self-pay | Source: Home / Self Care | Attending: Internal Medicine

## 2018-01-22 DIAGNOSIS — E1165 Type 2 diabetes mellitus with hyperglycemia: Secondary | ICD-10-CM

## 2018-01-22 DIAGNOSIS — I10 Essential (primary) hypertension: Secondary | ICD-10-CM

## 2018-01-22 DIAGNOSIS — T85598A Other mechanical complication of other gastrointestinal prosthetic devices, implants and grafts, initial encounter: Secondary | ICD-10-CM

## 2018-01-22 DIAGNOSIS — E876 Hypokalemia: Secondary | ICD-10-CM

## 2018-01-22 DIAGNOSIS — E042 Nontoxic multinodular goiter: Secondary | ICD-10-CM

## 2018-01-22 LAB — BASIC METABOLIC PANEL
ANION GAP: 7 (ref 5–15)
BUN: 28 mg/dL — ABNORMAL HIGH (ref 6–20)
CO2: 24 mmol/L (ref 22–32)
CREATININE: 1.07 mg/dL (ref 0.61–1.24)
Calcium: 8.2 mg/dL — ABNORMAL LOW (ref 8.9–10.3)
Chloride: 108 mmol/L (ref 98–111)
GFR calc Af Amer: >60 mL/min (ref >60)
GFR calc non Af Amer: >60 mL/min (ref >60)
Glucose, Bld: 163 mg/dL — ABNORMAL HIGH (ref 70–99)
Potassium: 3.5 mmol/L (ref 3.5–5.1)
Sodium: 139 mmol/L (ref 135–145)

## 2018-01-22 LAB — VANCOMYCIN, RANDOM: Vancomycin Rm: 10

## 2018-01-22 LAB — GLUCOSE, CAPILLARY
GLUCOSE-CAPILLARY: 150 mg/dL — AB (ref 70–99)
GLUCOSE-CAPILLARY: 127 mg/dL — AB (ref 70–99)
GLUCOSE-CAPILLARY: 185 mg/dL — AB (ref 70–99)
GLUCOSE-CAPILLARY: 207 mg/dL — AB (ref 70–99)
Glucose-Capillary: 129 mg/dL — ABNORMAL HIGH (ref 70–99)
Glucose-Capillary: 219 mg/dL — ABNORMAL HIGH (ref 70–99)

## 2018-01-22 LAB — CULTURE, RESPIRATORY W GRAM STAIN: Culture: NORMAL

## 2018-01-22 LAB — MAGNESIUM: Magnesium: 1.9 mg/dL (ref 1.7–2.4)

## 2018-01-22 SURGERY — ESOPHAGOGASTRODUODENOSCOPY (EGD) WITH PROPOFOL
Anesthesia: General

## 2018-01-22 MED ORDER — METOPROLOL TARTRATE 50 MG PO TABS
50.0000 mg | ORAL_TABLET | Freq: Two times a day (BID) | ORAL | Status: DC
  Administered 2018-01-22 – 2018-01-24 (×4): 50 mg via ORAL
  Filled 2018-01-22 (×4): qty 1

## 2018-01-22 MED ORDER — MAGNESIUM OXIDE 400 (241.3 MG) MG PO TABS
800.0000 mg | ORAL_TABLET | Freq: Once | ORAL | Status: AC
  Administered 2018-01-22: 800 mg via ORAL
  Filled 2018-01-22: qty 2

## 2018-01-22 MED ORDER — POTASSIUM CHLORIDE CRYS ER 20 MEQ PO TBCR: 20.0000 meq | EXTENDED_RELEASE_TABLET | Freq: Every day | ORAL | Status: DC

## 2018-01-22 MED ORDER — FUROSEMIDE 20 MG PO TABS
20.0000 mg | ORAL_TABLET | Freq: Every day | ORAL | Status: DC
  Administered 2018-01-23 – 2018-01-24 (×2): 20 mg via ORAL
  Filled 2018-01-22 (×2): qty 1

## 2018-01-22 MED ORDER — VANCOMYCIN HCL IN DEXTROSE 1-5 GM/200ML-% IV SOLN
1000.0000 mg | Freq: Two times a day (BID) | INTRAVENOUS | Status: DC
  Administered 2018-01-22 – 2018-01-23 (×3): 1000 mg via INTRAVENOUS
  Filled 2018-01-22 (×5): qty 200

## 2018-01-22 MED ORDER — POTASSIUM CHLORIDE CRYS ER 20 MEQ PO TBCR
40.0000 meq | EXTENDED_RELEASE_TABLET | Freq: Once | ORAL | Status: AC
  Administered 2018-01-22: 40 meq via ORAL
  Filled 2018-01-22: qty 2

## 2018-01-22 MED ORDER — MORPHINE SULFATE (PF) 2 MG/ML IV SOLN
2.0000 mg | INTRAVENOUS | Status: DC | PRN
  Administered 2018-01-22: 2 mg via INTRAVENOUS
  Filled 2018-01-22: qty 1

## 2018-01-22 MED ORDER — HYDRALAZINE HCL 25 MG PO TABS
25.0000 mg | ORAL_TABLET | Freq: Three times a day (TID) | ORAL | Status: DC
  Administered 2018-01-22 – 2018-01-24 (×6): 25 mg via ORAL
  Filled 2018-01-22 (×6): qty 1

## 2018-01-22 MED ORDER — LOSARTAN POTASSIUM 50 MG PO TABS: 25.0000 mg | ORAL_TABLET | Freq: Every day | ORAL | Status: DC

## 2018-01-22 NOTE — Progress Notes (Signed)
Notified Dr. Celesta Aver of Telemetry report of Non Sustain Runs of VT/PVCs. Will give mag PO 800mg  per Dr, Celesta Aver, see [facility] and will continue to monitor.

## 2018-01-22 NOTE — Progress Notes (Signed)
PULMONARY / CRITICAL CARE MEDICINE   Name: [name] MRN: [id number] DOB: 06-19-1961    ADMISSION DATE:  01/20/2018 CONSULTATION DATE: 01/20/2018  REFERRING MD: Dr. Jodell Cipro   CHIEF COMPLAINT: Shortness of Breath   HISTORY OF PRESENT ILLNESS:   This is a 57 yo male with a PMH of OSA, HTN, Diabetes Mellitus, Multinodular Goiter, Chest Pain, and Current Everyday Smoker.  He presented to [facility] ER on 07/19 via EMS with shortness of breath, bilateral lower extremity edema, orthopnea, non-productive cough, and insomnia onset of symptoms 2 days prior to presentation. According to pts wife he has also had poor po intake for 1 month requiring hospitalization for treatment of pancreatitis.  EMS reported when they arrived at pts home they were unable to obtain an O2 sat and systolic bp greater than 616.  Per ER notes upon arrival to the ER pt gasping, severely diaphoretic, heart rate 140's,  O2 sats 60%, and pt shouting he could not breath.  He also had brief run of ventricular tachycardia, zoll pads applied.  He was subsequently placed on Bipap to pre oxygenate prior to intubation, nitroglycerin drip initiated at 100 mcg/min, and pt received iv ativan for agitation. CXR revealed deviated trachea to the right secondary to known left thyromegaly, pulmonary edema and possible pneumonia.  Despite treatment pts respiratory failure did not improve requiring mechanical intubation.  During intubation pt required frequent suction due to copious secretions.  Lab results revealed K+ 3.4, glucose 398, AST 46, BNP 593, troponin 0.06, lactic acid 5.4, wbc 27.3, and abg pH 7.13, pCO2 62, PO2 76, acid-base deficit 9.6, and bicarb 20.6. He received 40 mg iv lasix x1 dose and sepsis protocol initiated he received iv abx, however unable to administer iv fluids due to flash pulmonary edema. He was subsequently admitted to ICU by hospitalist team for further workup and treatment PCCM consulted for vent management.   During  previous hospitalization 12/2017 CXR showed tracheal deviation suggesting thyroid goiter, therefore CT Chest ordered revealing thyroid nodule. Following hospitalization pt followed up with his PCP who ordered an US Neck confirming multinodular goiter on 12/22/17  (right lobe 5.1  x 2.0 x 2.1 cm and left lobe 6.2 x .3.7 cm). He was referred to Endocrinology for evaluation initial office visit 01/09/2018 with recommendation of fine needle aspiration biopsy scheduled on 02/08/18, if malignant pt would undergo surgery to remove the nodules. He also endorsed nocturnal dyspnea, therefore per endocrinologist if biopsy results benign pt to undergo breathing test to determine if nodules were contributing factor, if so pt would require surgery for nodule removal.  REVIEW OF SYSTEMS:   Patient reports insomnia and the uneasy feeling as though he will die.  These episodes started several weeks ago and occur only at night.  Otherwise ROS negative.  SUBJECTIVE:  Episode of anxiety at bedtime.  VITAL SIGNS: BP (!) 151/95   Pulse 85   Temp 98 F (36.7 C) (Axillary)   Resp (!) 28   Ht 5\' 10"  (1.778 m)   Wt 229 lb 8 oz (104.1 kg)   SpO2 97%   BMI 32.93 kg/m   HEMODYNAMICS:  no compromise  VENTILATOR SETTINGS: FiO2 (%):  [60 %] 60 %  INTAKE / OUTPUT: I/O last 3 completed shifts: In: 2466 [I.V.:781; IV Piggyback:1685] Out: 2425 [Urine:2425]  PHYSICAL EXAMINATION: General: comfortable, no distress. Neuro: agitated, opens eyes to voice, PERRL HEENT: Moist mucous membranes Cardiovascular: sinus tach, no murmurs,  diaphorectic but wife reports this to be patient's  norm Lungs: occasional rhonchi Abdomen: +BS x4, obese, soft, non distended  Musculoskeletal: normal bulk and tone Skin: intact no lesions or rashes present   LABS:  BMET Recent Labs  Lab 01/20/18 0231 01/21/18 0536 01/22/18 0617  NA 141 144 139  K 3.4* 3.8 3.5  CL 106 113* 108  CO2 22 24 24   BUN 14 27* 28*  CREATININE 0.97 1.64*  1.07  GLUCOSE 398* 172* 163*    Electrolytes Recent Labs  Lab 01/20/18 0231 01/21/18 0536 01/22/18 0617  CALCIUM 8.8* 8.0* 8.2*  MG 1.9 2.1 1.9  PHOS 4.5 3.0  --     CBC Recent Labs  Lab 01/20/18 0231 01/21/18 0536  WBC 27.3* 18.6*  HGB 14.7 12.1*  HCT 43.9 35.8*  PLT 284 219    Coag's Recent Labs  Lab 01/20/18 0231  INR 0.91    Sepsis Markers Recent Labs  Lab 01/20/18 0231 01/20/18 0635 01/21/18 0536  LATICACIDVEN 5.4* 2.0* 1.0  PROCALCITON <0.10  --   --     ABG Recent Labs  Lab 01/20/18 0401 01/20/18 0807  PHART 7.28* 7.43  PCO2ART 54* 34  PO2ART 133* 395*    Liver Enzymes Recent Labs  Lab 01/20/18 0231  AST 46*  ALT 36  ALKPHOS 62  BILITOT 0.6  ALBUMIN 3.6    Cardiac Enzymes Recent Labs  Lab 01/20/18 1342 01/20/18 2023 01/21/18 0536  TROPONINI 0.45* 0.42* 0.36*    Glucose Recent Labs  Lab 01/21/18 1617 01/21/18 1933 01/21/18 2345 01/22/18 0315 01/22/18 0710 01/22/18 1128  GLUCAP 203* 169* 165* 150* 127* 185*    Imaging No results found. STUDIES:  CT Soft Tissue Neck 07/19>>Thyroid nodules measuring up to 5.6 cm in the left lobe of thyroid. Mass effect from the large left lobe of thyroid nodule displaces the airway rightward. Endotracheal tube tip 3.4 cm above the carina. Moderate bilateral pleural effusion with dependent atelectasis of the lungs. CT Chest High Resolution 07/19>>  CULTURES/TEST: Sputum 07/19>> Blood x2 07/19>> Urine 07/19>> MRSA PCR 07/19>> Strep pneumoniae urinary antigen 07/19>> Legionella pneumophilia serogp UR Ag 07/19>>  ANTIBIOTICS: Cefepime 07/19>> Vancomycin 07/19>>  SIGNIFICANT EVENTS: 07/19 Pt admitted to ICU mechanically intubated   LINES/TUBES: ETT 07/19>> Right femoral CVL 07/19>>  ASSESSMENT / PLAN:  PULMONARY A: Acute hypoxic hypercapnic respiratory failure secondary to pulmonary edema and possible pneumonia has improved significantly as he has been extubated and doing  well. Multinodular goiter with tracheal deviation  Hx: OSA and Current Everyday Smoker P:   Cont on ABx for now Cont on bronchodilators Keep in even fluid balance Night time BIPAP which has also helped alleviate the feeling of dying.   CARDIOVASCULAR A:  Sinus tachycardia Uncontrolled Hypertension-patient started on beta blockers yesterday. Attempted to restart Cozaar but there is a listed allergy to ACE and patient relayed reaction to Cozaar.  Will start Hydralazine instead. Severe Cardiomyopathy Elevated troponin likely secondary to demand ischemia in setting of respiratory failure  P:  Start hydralazine for better BP control Continue beta blocker Will send off catecholaminene levels to evaluate hypertension and perfuse sweating.   RENAL A: AKI  Has resolved Hypokalemia- will replace Lactic acidosis - resolved P:   Replace electrolytes as indicated  Monitor UOP  GASTROINTESTINAL A:   Abdominal pain has resolved Transaminasemia (AST 46) P:   Continue diet as outlined by speech therapist SUP px: iv pepcid  HEMATOLOGIC A:   No acute issues  P:  VTE px: subq lovenox Trend CBC  Monitor  for s/sx of bleeding  Transfuse for hgb <7  INFECTIOUS A:   Possible Pneumonia  P:   Tracheal aspirates washings did not identify any pathogens. Continue abx for total of 1 week then D/C.  ENDOCRINE A:   Multinodular goiter with tracheal deviation  Diabetes Mellitus  P:   For follow up of thyroid- FNB as out patient Target glucose management  NEUROLOGIC A:   P:   No active issues; wife reports patient has excessive sweating for 17 years.   Misc: out of bed. Transfer out of ICU once BP remains stable/ controlled.  FAMILY  - Updates: Pts wife updated regarding plan of care and all questions answered.  Thank you for allowing me the privilege to care for this patient.      Cammie Sickle, MD Pulmonary/Critical Care Pager [phone] (please enter 7  digits) [place] Pager [phone] (please enter 7 digits)

## 2018-01-22 NOTE — Progress Notes (Signed)
At 23:00, pt became very anxious and complaining of a nonproductive cough.  Xanax 1mg  was ordered and given as well as guaifenesin-codeine 61ml.  Pt continued to cough and his anxiety level continued to increase.  Pt became very diaphoretic and sats started dropping.  Pt went from 2L N/C to 5L N/C without improvement.  A breathing treatment was ordered and given, 2mg  of morphine was also given for air hunger and pt was placed on BiPap.  20mg  of Lasix was also given to help get rid of fluid.  Pt is now resting comfortably, sats 98% on 60% BiPap.

## 2018-01-22 NOTE — Progress Notes (Signed)
Pharmacy Antibiotic Note  [name] is a 57 y.o. male admitted on 01/20/2018 with pneumonia.  Pharmacy has been consulted for vanc/cefepime dosing. Patient received vanc 1g and cefepime 2g IV x 1.  Plan: Will continue vanc 1g IV q8h w/ 6 hour stack  Will draw vanc trough 07/20 @ 1100 prior to 4th dose. Will continue cefepime 2g IV q8h.  Ke 0.108 T1/2 6 ~ 8 hrs Goal trough 15 - 20 mcg/mL  7/20 ~ 12:15  VT = 23. Called RN to stop dose that was currently infusing. Patient probably received 500mg .   Ordered at level for 07/21 at 00:30 to determine further dosing.   7/21 01:00 Vanc random = 10. Will restart Vancomycin 1g IV q12h  Height: 5\' 10"  (177.8 cm) Weight: 231 lb 0.7 oz (104.8 kg) IBW/kg (Calculated) : 73  Temp (24hrs), Avg:98.4 F (36.9 C), Min:97.8 F (36.6 C), Max:100 F (37.8 C)  Recent Labs  Lab 01/20/18 0231 01/20/18 0635 01/21/18 0536 01/21/18 1146 01/22/18 0112  WBC 27.3*  --  18.6*  --   --   CREATININE 0.97  --  1.64*  --   --   LATICACIDVEN 5.4* 2.0* 1.0  --   --   VANCOTROUGH  --   --   --  23*  --   VANCORANDOM  --   --   --   --  10    Estimated Creatinine Clearance: 60.2 mL/min (A) (by C-G formula based on SCr of 1.64 mg/dL (H)).    Allergies  Allergen Reactions  . Enalapril Maleate Anaphylaxis  . Iodinated Diagnostic Agents Itching  . Isosorbide Mononitrate Er [Isosorbide Dinitrate] Other (See Comments)    "made me not be able to think or function"  . Canagliflozin Other (See Comments)    Thank you for allowing pharmacy to be a part of this patient's care.  Paulina Fusi, PharmD, BCPS 01/22/2018 1:54 AM

## 2018-01-22 NOTE — Progress Notes (Signed)
[place] at [facility]                                                                                                                                                                                  Patient Demographics   [name], is a 57 y.o. male, DOB - 1960-11-19, YIA:[id number]  Admit date - 01/20/2018   Admitting Physician Arta Silence, MD  Outpatient Primary MD for the patient is Marguerita Merles, MD   LOS - 2  Subjective: Patient extubated states that he is feeling better denies any chest pain   Review of Systems:   CONSTITUTIONAL:   CONSTITUTIONAL: No documented fever. No fatigue, weakness. No weight gain, no weight loss.  EYES: No blurry or double vision.  ENT: No tinnitus. No postnasal drip. No redness of the oropharynx.  RESPIRATORY: No cough, no wheeze, no hemoptysis. No dyspnea.  CARDIOVASCULAR: No chest pain. No orthopnea. No palpitations. No syncope.  GASTROINTESTINAL: No nausea, no vomiting or diarrhea. No abdominal pain. No melena or hematochezia.  GENITOURINARY:  No urgency. No frequency. No dysuria. No hematuria. No obstructive symptoms. No discharge. No pain. No significant abnormal bleeding ENDOCRINE: No polyuria or nocturia. No heat or cold intolerance.  HEMATOLOGY: No anemia. No bruising. No bleeding. No purpura. No petechiae INTEGUMENTARY: No rashes. No lesions.  MUSCULOSKELETAL: No arthritis. No swelling. No gout.  NEUROLOGIC: No numbness, tingling, or ataxia. No seizure-type activity.  PSYCHIATRIC: No anxiety. No insomnia. No ADD.     Vitals:   Vitals:   01/22/18 0800 01/22/18 0900 01/22/18 1000 01/22/18 1100  BP: (!) 173/109 (!) 165/103 (!) 182/87 (!) 151/95  Pulse: 94 (!) 105 94 85  Resp: (!) 28 (!) 26 (!) 25 (!) 28  Temp: 98 F (36.7 C)     TempSrc: Axillary     SpO2: 97% 97% 98% 97%  Weight:      Height:        Wt Readings from Last 3 Encounters:  01/22/18 104.1 kg (229 lb 8 oz)  12/13/17 102.1 kg  (225 lb)  11/08/17 108 kg (238 lb)     Intake/Output Summary (Last 24 hours) at 01/22/2018 1202 Last data filed at 01/22/2018 7078 Gross per 24 hour  Intake 921.9 ml  Output 1800 ml  Net -878.1 ml    Physical Exam:   GENERAL: No acute distress currently HEAD, EYES, EARS, NOSE AND THROAT: Atraumatic, normocephalic. Extraocular muscles are intact. Pupils equal and reactive to light. Sclerae anicteric. No conjunctival injection. No oro-pharyngeal erythema.  NECK: Supple. There is no jugular venous distention. No bruits, no lymphadenopathy, no thyromegaly.  HEART: Regular rate and rhythm,. No murmurs, no  rubs, no clicks.  LUNGS: On the vent ABDOMEN: Soft, flat, nontender, nondistended. Has good bowel sounds. No hepatosplenomegaly appreciated.  EXTREMITIES: No evidence of any cyanosis, clubbing, or peripheral edema.  +2 pedal and radial pulses bilaterally.  NEUROLOGIC: Follow, commands  sKIN: Moist and warm with no rashes appreciated.  Diaphoretic Psych: Not anxious, depressed LN: No inguinal LN enlargement    Antibiotics   Anti-infectives (From admission, onward)   Start     Dose/Rate Route Frequency Ordered Stop   01/22/18 0200  vancomycin (VANCOCIN) IVPB 1000 mg/200 mL premix     1,000 mg 200 mL/hr over 60 Minutes Intravenous Every 12 hours 01/22/18 0149     01/20/18 1200  ceFEPIme (MAXIPIME) 2 g in sodium chloride 0.9 % 100 mL IVPB     2 g 200 mL/hr over 30 Minutes Intravenous Every 8 hours 01/20/18 0618     01/20/18 1200  vancomycin (VANCOCIN) IVPB 1000 mg/200 mL premix  Status:  Discontinued     1,000 mg 200 mL/hr over 60 Minutes Intravenous Every 8 hours 01/20/18 0649 01/21/18 1216   01/20/18 0330  ceFEPIme (MAXIPIME) 2 g in sodium chloride 0.9 % 100 mL IVPB     2 g 200 mL/hr over 30 Minutes Intravenous  Once 01/20/18 0321 01/20/18 0517   01/20/18 0330  vancomycin (VANCOCIN) IVPB 1000 mg/200 mL premix     1,000 mg 200 mL/hr over 60 Minutes Intravenous  Once 01/20/18 0321  01/20/18 0724      Medications   Scheduled Meds: . aspirin  81 mg Oral Daily  . atorvastatin  40 mg Oral q1800  . chlorhexidine gluconate (MEDLINE KIT)  15 mL Mouth Rinse BID  . Chlorhexidine Gluconate Cloth  6 each Topical Q0600  . enoxaparin (LOVENOX) injection  1 mg/kg Subcutaneous Q12H  . insulin aspart  0-15 Units Subcutaneous Q4H  . insulin glargine  10 Units Subcutaneous Daily  . ipratropium-albuterol  3 mL Nebulization Q6H  . losartan  25 mg Oral Daily  . mouth rinse  15 mL Mouth Rinse BID  . metoprolol tartrate  25 mg Oral BID  . midazolam  2 mg Intravenous Once  . mupirocin ointment  1 application Nasal BID   Continuous Infusions: . sodium chloride    . ceFEPime (MAXIPIME) IV 2 g (01/22/18 1159)  . famotidine (PEPCID) IV Stopped (01/22/18 1013)  . potassium chloride Stopped (01/20/18 1300)  . vancomycin Stopped (01/22/18 0512)   PRN Meds:.sodium chloride, acetaminophen, ALPRAZolam, bisacodyl, guaiFENesin-codeine, HYDROcodone-acetaminophen, morphine injection, ondansetron (ZOFRAN) IV, potassium chloride, senna-docusate   Data Review:   Micro Results Recent Results (from the past 240 hour(s))  Blood Culture (routine x 2)     Status: None (Preliminary result)   Collection Time: 01/20/18  3:25 AM  Result Value Ref Range Status   Specimen Description BLOOD LEFT WRIST  Final   Special Requests   Final    BOTTLES DRAWN AEROBIC AND ANAEROBIC Blood Culture adequate volume   Culture   Final    NO GROWTH 2 DAYS Performed at [facility], [address], [place], [place] [address]    Report Status PENDING  Incomplete  Blood Culture (routine x 2)     Status: None (Preliminary result)   Collection Time: 01/20/18  3:25 AM  Result Value Ref Range Status   Specimen Description BLOOD LEFT HAND   Final   Special Requests   Final    BOTTLES DRAWN AEROBIC AND ANAEROBIC Blood Culture adequate volume   Culture  Final    NO GROWTH 2 DAYS Performed at [facility], [address] [place], [place] [address]    Report Status PENDING  Incomplete  Urine culture     Status: None   Collection Time: 01/20/18  3:25 AM  Result Value Ref Range Status   Specimen Description   Final    URINE, CATHETERIZED Performed at [facility], [address] [place], [place] [address]    Special Requests   Final    NONE Performed at [facility], [address] [place], [place] [address]    Culture   Final    NO GROWTH Performed at [facility], [place] [address] [place], [place] [address]    Report Status 01/21/2018 FINAL  Final  MRSA PCR Screening     Status: Abnormal   Collection Time: 01/20/18  3:38 AM  Result Value Ref Range Status   MRSA by PCR POSITIVE (A) NEGATIVE Final    Comment:        The GeneXpert MRSA Assay (FDA approved for NASAL specimens only), is one component of a comprehensive MRSA colonization surveillance program. It is not intended to diagnose MRSA infection nor to guide or monitor treatment for MRSA infections. RESULT CALLED TO, READ BACK BY AND VERIFIED WITH: CHARLI FLEETWOOD AT 0700 01/20/18 SDR Performed at [facility], [address] [place], [place] [address]   Culture, respiratory (NON-Expectorated)     Status: None   Collection Time: 01/20/18  8:27 AM  Result Value Ref Range Status   Specimen Description   Final    TRACHEAL ASPIRATE Performed at [facility], [address], [place], [place] [address]    Special Requests   Final    NONE Performed at [facility], [address] [place], [place] [address]    Gram Stain   Final    RARE WBC PRESENT, PREDOMINANTLY PMN RARE GRAM POSITIVE COCCI FEW GRAM POSITIVE RODS    Culture   Final    FEW Consistent with normal respiratory flora. Performed at [facility], [place] [address] [place],  [address]    Report Status 01/22/2018 FINAL  Final    Radiology Reports Dg Abd 1 View  Result  Date: 01/20/2018 CLINICAL DATA:  Encounter for NG tube placement EXAM: ABDOMEN - 1 VIEW COMPARISON:  01/20/2018 FINDINGS: Malpositioned nasogastric tube, coiled backwards overlying the mid esophagus with tip excluded from view Endotracheal tube overlies the tracheal air column with tip superior to the carina. No pneumothorax. Pacer leads overlie the left ventricular apex. Pulmonary vasculature remains indistinct with cephalization of flow. Suspected trace right-sided pleural effusion. No definite left-sided pleural effusion, though the left costophrenic angle is excluded from view. Nonobstructive bowel gas pattern. No pneumoperitoneum, pneumatosis or portal venous gas. IMPRESSION: 1. Malpositioned nasogastric tube.  Repositioning is advised. 2. Similar findings of pulmonary edema and trace right-sided pleural effusion. 3. Nonobstructive bowel gas pattern. Electronically Signed   By: Sandi Mariscal M.D.   On: 01/20/2018 14:03   Dg Abd 1 View  Result Date: 01/20/2018 CLINICAL DATA:  NG tube placement EXAM: ABDOMEN - 1 VIEW COMPARISON:  None. FINDINGS: There is no nasogastric tube identified in the lower chest or abdomen. There is mild gaseous distension of the colon and stomach. There is no evidence of pneumoperitoneum, portal venous gas or pneumatosis. There are no pathologic calcifications along the expected course of the ureters. The osseous structures are unremarkable. IMPRESSION: No nasogastric tube  identified in the lower chest or abdomen. Electronically Signed   By: Kathreen Devoid   On: 01/20/2018 12:42   Dg Abd 1 View  Result Date: 01/20/2018 CLINICAL DATA:  Abdominal pain. EXAM: ABDOMEN - 1 VIEW COMPARISON:  None. FINDINGS: A right-sided pleural effusion is suspected layering posteriorly. Pacer paddles are noted on the chest. The bowel gas pattern is unremarkable. Air and stool scattered throughout the colon. A few scattered air-filled small bowel loops. No obvious free air. A right femoral line is noted.  Bony structures are unremarkable. IMPRESSION: Suspect right pleural effusion. No plain film findings for an acute abdominal process. Electronically Signed   By: Marijo Sanes M.D.   On: 01/20/2018 11:22   Ct Soft Tissue Neck Wo Contrast  Result Date: 01/20/2018 CLINICAL DATA:  57 y/o  M; thyroid nodule. EXAM: CT NECK WITHOUT CONTRAST TECHNIQUE: Multidetector CT imaging of the neck was performed following the standard protocol without intravenous contrast. COMPARISON:  None. FINDINGS: Pharynx and larynx: Debris within the airways from intubation. Salivary glands: No inflammation, mass, or stone. Thyroid: 5.6 cm nodule within the left lobe of the thyroid. 18 mm nodule in the right lobe of thyroid. Lymph nodes: None enlarged or abnormal density. Vascular: Negative. Limited intracranial: Negative. Visualized orbits: Negative. Mastoids and visualized paranasal sinuses: Mild mucosal thickening of the left maxillary sinus. Normal aeration of the mastoid air cells. Skeleton: No acute or aggressive process. Dental disease with multiple periapical cysts and absent crowns. Mild cervical spondylosis greatest at the C5-6 level. Upper chest: Endotracheal tube tip 3.4 cm above the carina. Moderate bilateral pleural effusions and dependent atelectasis of the lungs. Other: None. IMPRESSION: 1. Thyroid nodules measuring up to 5.6 cm in the left lobe of thyroid. Mass effect from the large left lobe of thyroid nodule displaces the airway rightward. 2. Endotracheal tube tip 3.4 cm above the carina. 3. Moderate bilateral pleural effusion with dependent atelectasis of the lungs. Electronically Signed   By: Kristine Garbe M.D.   On: 01/20/2018 04:44   Dg Chest Port 1 View  Result Date: 01/21/2018 CLINICAL DATA:  Acute respiratory failure. EXAM: PORTABLE CHEST 1 VIEW COMPARISON:  01/20/2018 FINDINGS: The endotracheal tube is in good position at the mid tracheal level. The lungs demonstrate improved aeration with resolving  edema and atelectasis. Probable persistent small layering right pleural effusion. IMPRESSION: Stable position of the endotracheal tube. Improved lung aeration with resolving edema and atelectasis. Electronically Signed   By: Marijo Sanes M.D.   On: 01/21/2018 08:36   Dg Chest Portable 1 View  Result Date: 01/20/2018 CLINICAL DATA:  57 y/o  M; status post intubation. EXAM: PORTABLE CHEST 1 VIEW COMPARISON:  01/20/2018 chest radiograph FINDINGS: Stable cardiomegaly given projection and technique. Interstitial and alveolar pulmonary edema. Probable small effusions. Endotracheal tube tip 4.5 cm above the carina. Transcutaneous pacing pads noted. Bones are unremarkable. IMPRESSION: 1. Endotracheal tube tip 4.5 cm above the carina. 2. Stable cardiomegaly and pulmonary edema. Probable small effusions. Electronically Signed   By: Kristine Garbe M.D.   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 Stable cardiomegaly. Nonaneurysmal thoracic aorta. Diffuse bilateral airspace disease and vascular congestion consistent with pulmonary edema. No significant effusion or pneumothorax. No acute osseous abnormality. IMPRESSION: Stable cardiomegaly with pulmonary edema. Superimposed pneumonia is not entirely excluded but believed  less likely. Electronically Signed   By: Ashley Royalty M.D.   On: 01/20/2018 02:56   Dg Addison Bailey G Tube Plc W/fl W/rad  Result Date: 01/20/2018 CLINICAL DATA:  Patient presents for Dobbhoff tube placement. EXAM: NASO G TUBE PLACEMENT WITH FL AND WITH RAD FLUOROSCOPY TIME:  Fluoroscopy Time:  0.3 minute Radiation Exposure Index (if provided by the fluoroscopic device): 1.3 mGy Number of Acquired Spot Images: 0 COMPARISON:  None. FINDINGS: Multiple attempts at placing a Dobbhoff tube were made through the  right and left nare. The tip of the Dobbhoff tube would not progressed beyond the level of the mid trachea on repeated attempts. Attempts were made at advancing the Dobbhoff tube following deflating the tracheostomy cuff, but the Dobbhoff tube could not advance any further. Examination was terminated at this time. IMPRESSION: 1. Multiple unsuccessful attempts at placing a Dobbhoff tube which would not progressed beyond the level of the mid trachea Electronically Signed   By: Kathreen Devoid   On: 01/20/2018 16:41     CBC Recent Labs  Lab 01/20/18 0231 01/21/18 0536  WBC 27.3* 18.6*  HGB 14.7 12.1*  HCT 43.9 35.8*  PLT 284 219  MCV 95.2 93.9  MCH 32.0 31.8  MCHC 33.6 33.9  RDW 13.5 13.6  LYMPHSABS 11.5*  --   MONOABS 2.7*  --   EOSABS 0.3  --   BASOSABS 0.0  --     Chemistries  Recent Labs  Lab 01/20/18 0231 01/21/18 0536 01/22/18 0617  NA 141 144 139  K 3.4* 3.8 3.5  CL 106 113* 108  CO2 22 24 24   GLUCOSE 398* 172* 163*  BUN 14 27* 28*  CREATININE 0.97 1.64* 1.07  CALCIUM 8.8* 8.0* 8.2*  MG 1.9 2.1 1.9  AST 46*  --   --   ALT 36  --   --   ALKPHOS 62  --   --   BILITOT 0.6  --   --    ------------------------------------------------------------------------------------------------------------------ estimated creatinine clearance is 92 mL/min (by C-G formula based on SCr of 1.07 mg/dL). ------------------------------------------------------------------------------------------------------------------ No results for input(s): HGBA1C in the last 72 hours. ------------------------------------------------------------------------------------------------------------------ Recent Labs    01/20/18 0459  CHOL 191  HDL NOT REPORTED DUE TO HIGH TRIGLYCERIDES  LDLCALC UNABLE TO CALCULATE IF TRIGLYCERIDE OVER 400 mg/dL  TRIG 1,658*  CHOLHDL NOT REPORTED DUE TO HIGH TRIGLYCERIDES    ------------------------------------------------------------------------------------------------------------------ Recent Labs    01/20/18 0231 01/21/18 2300  TSH 4.696* 0.723  T3FREE 3.7  --    ------------------------------------------------------------------------------------------------------------------ No results for input(s): VITAMINB12, FOLATE, FERRITIN, TIBC, IRON, RETICCTPCT in the last 72 hours.  Coagulation profile Recent Labs  Lab 01/20/18 0231  INR 0.91    No results for input(s): DDIMER in the last 72 hours.  Cardiac Enzymes Recent Labs  Lab 01/20/18 1342 01/20/18 2023 01/21/18 0536  TROPONINI 0.45* 0.42* 0.36*   ------------------------------------------------------------------------------------------------------------------ Invalid input(s): POCBNP    Assessment & Plan   Patient is 57 year old with acute respiratory failure  1.  Acute respiratory failure multifactorial status post intubation possibly due to acute  systolic CHF with bilateral pleural effusion Continue 2 L of oxygen Echo shows significant systolic dysfunction worsened since last year cardiology following further work-up per cardiology Continue IV antibiotics And oral Lasix 2.  Diabetes type 2 continue Lantus and sliding scale insulin  3.  Hypertension continue Cozaar  4.  Elevated troponin likely due to demand ischemia follow cardiac enzymes echo of the heart shows decrease in EF further recommendation  per cardiology  5.  Sleep apnea I recommended strongly patient use CPAP at night  6.  Thyroid mass recommend checking free T3 and free T4 ,TSH is normal  7,Lovenox for DVT prophylaxis     Code Status Orders  (From admission, onward)        Start     Ordered   01/20/18 0415  Full code  Continuous     01/20/18 0414    Code Status History    This patient has a current code status but no historical code status.    Advance Directive Documentation     Most Recent Value   Type of Advance Directive  Healthcare Power of Attorney, Living will  Pre-existing out of facility DNR order (yellow form or pink MOST form)  -  "MOST" Form in Place?  -           Consults  intestivist  DVT Prophylaxis   lovenox  Lab Results  Component Value Date   PLT 219 01/21/2018     Time Spent in minutes  63mn Greater than 50% of time spent in care coordination and counseling patient regarding the condition and plan of care.   SDustin FlockM.D on 01/22/2018 at 12:02 PM  Between 7am to 6pm - Pager - [phone]  After 6pm go to [email or web address] - [email or web address]   Office  [phone]

## 2018-01-22 NOTE — Progress Notes (Addendum)
Notified Dr. Celesta Aver of Pt status, Vent Trigeminy. Obtained EKG and notified Cardio Dr. Saralyn Pilar. New orders to increase Metoprolol dose 25mg  to 50mg , see MAR per Dr. Saralyn Pilar. No other orders that this time. Updated Dr. Saralyn Pilar on elevated blood pressures earlier in shift and new orders per Dr. Celesta Aver. Pt remains asymptomatic. HR 95, BP 135/66.

## 2018-01-22 NOTE — Progress Notes (Signed)
Pt transferred to [place] via bed. A&O on 2L [place] denies SOB and pain at this time.

## 2018-01-22 NOTE — Consult Note (Addendum)
Tyler Antigua, MD [address] [place], [place], [place], [id number], [place], [place], [place], [address] Phone: [phone]  Fax: [phone]  Consultation  Referring Provider:     Dr. Trena Platt primary Care Physician:  Marguerita Merles, MD Reason for Consultation:     Difficulty placing dob hoff tube  Date of Admission:  01/20/2018 Date of Consultation:  01/22/2018         HPI:   [name] is a 57 y.o. male admitted with acute hypoxemic respiratory failure, intubated on presentation with CT showing thyroid nodules measuring up to 5.6 cm, mass-effect from large left lobe of thyroid nodule displacing the airway rightward, with GI consulted due to difficulty placing Dobbhoff tube.  Since the consult was placed, patient has been extubated and is currently tolerating oral diet recommended by speech and swallow therapy.  Patient reports tolerating diet well without dysphagia.  No nausea or vomiting, or choking or coughing spells with swallowing.  Does report some sore throat since extubation.  No odynophagia.  No abdominal pain.  Past Medical History:  Diagnosis Date  . Chest pain 2019  . Diabetes mellitus without complication ([facility])   . Hypertension   . Sleep apnea     Past Surgical History:  Procedure Laterality Date  . COLONOSCOPY  2017  . LIPOMA EXCISION     Left forehead    Prior to Admission medications   Medication Sig Start Date End Date Taking? Authorizing Provider  aspirin EC 81 MG tablet Take 81 mg by mouth daily.   Yes [provider]  losartan (COZAAR) 25 MG tablet Take 25 mg by mouth daily. 08/26/16  Yes [provider]  metFORMIN (GLUCOPHAGE-XR) 500 MG 24 hr tablet Take 2,000 mg by mouth daily.    Yes [provider]  saxagliptin HCl (ONGLYZA) 5 MG TABS tablet Take 5 mg by mouth daily.    Yes [provider]  HYDROcodone-acetaminophen (NORCO/VICODIN) 5-325 MG tablet Take 1 tablet by mouth every 4 (four) hours  as needed. Patient not taking: Reported on 01/20/2018 12/13/17   Harvest Dark, MD  polyethylene glycol powder (GLYCOLAX/MIRALAX) powder Take 17 g by mouth daily as needed for moderate constipation. Patient not taking: Reported on 01/20/2018 12/13/17   Harvest Dark, MD    Family History  Problem Relation Age of Onset  . Diabetes Father   . Colon cancer Neg Hx      Social History   Tobacco Use  . Smoking status: Current Every Day Smoker    Packs/day: 0.30    Years: 32.00    Pack years: 9.60    Types: Cigarettes  . Smokeless tobacco: Never Used  Substance Use Topics  . Alcohol use: No  . Drug use: Never    Allergies as of 01/20/2018 - Review Complete 01/20/2018  Allergen Reaction Noted  . Enalapril maleate Anaphylaxis 03/01/2007  . Iodinated diagnostic agents Itching 12/13/2017  . Isosorbide mononitrate er [isosorbide dinitrate] Other (See Comments) 12/13/2017  . Canagliflozin Other (See Comments) 12/16/2015    Review of Systems:    All systems reviewed and negative except where noted in HPI.   Physical Exam:  Vital signs in last 24 hours: Vitals:   01/22/18 0600 01/22/18 0700 01/22/18 0723 01/22/18 0800  BP: (!) 152/98 (!) 153/95  (!) 173/109  Pulse: 88 89  94  Resp: 20 (!) 23  (!) 28  Temp:    98 F (36.7 C)  TempSrc:  Axillary  SpO2: 96% 98% 98% 97%  Weight:      Height:       Last BM Date: (UTA-pt intubated) General:   Pleasant, cooperative in NAD Head:  Normocephalic and atraumatic. Eyes:   No icterus.   Conjunctiva pink. PERRLA. Ears:  Normal auditory acuity. Neck:  Supple; no masses or thyroidomegaly Lungs: Respirations even and unlabored. Lungs clear to auscultation bilaterally.   No wheezes, crackles, or rhonchi.  Abdomen:  Soft, nondistended, nontender. Normal bowel sounds. No appreciable masses or hepatomegaly.  No rebound or guarding.  Neurologic:  Alert and oriented x3;  grossly normal neurologically. Skin:  Intact without significant  lesions or rashes. Cervical Nodes:  No significant cervical adenopathy. Psych:  Alert and cooperative. Normal affect.  LAB RESULTS: Recent Labs    01/20/18 0231 01/21/18 0536  WBC 27.3* 18.6*  HGB 14.7 12.1*  HCT 43.9 35.8*  PLT 284 219   BMET Recent Labs    01/20/18 0231 01/21/18 0536 01/22/18 0617  NA 141 144 139  K 3.4* 3.8 3.5  CL 106 113* 108  CO2 22 24 24   GLUCOSE 398* 172* 163*  BUN 14 27* 28*  CREATININE 0.97 1.64* 1.07  CALCIUM 8.8* 8.0* 8.2*   LFT Recent Labs    01/20/18 0231  PROT 8.4*  ALBUMIN 3.6  AST 46*  ALT 36  ALKPHOS 62  BILITOT 0.6   PT/INR Recent Labs    01/20/18 0231  LABPROT 12.2  INR 0.91    STUDIES: Dg Abd 1 View  Result Date: 01/20/2018 CLINICAL DATA:  Encounter for NG tube placement EXAM: ABDOMEN - 1 VIEW COMPARISON:  01/20/2018 FINDINGS: Malpositioned nasogastric tube, coiled backwards overlying the mid esophagus with tip excluded from view Endotracheal tube overlies the tracheal air column with tip superior to the carina. No pneumothorax. Pacer leads overlie the left ventricular apex. Pulmonary vasculature remains indistinct with cephalization of flow. Suspected trace right-sided pleural effusion. No definite left-sided pleural effusion, though the left costophrenic angle is excluded from view. Nonobstructive bowel gas pattern. No pneumoperitoneum, pneumatosis or portal venous gas. IMPRESSION: 1. Malpositioned nasogastric tube.  Repositioning is advised. 2. Similar findings of pulmonary edema and trace right-sided pleural effusion. 3. Nonobstructive bowel gas pattern. Electronically Signed   By: Sandi Mariscal M.D.   On: 01/20/2018 14:03   Dg Abd 1 View  Result Date: 01/20/2018 CLINICAL DATA:  NG tube placement EXAM: ABDOMEN - 1 VIEW COMPARISON:  None. FINDINGS: There is no nasogastric tube identified in the lower chest or abdomen. There is mild gaseous distension of the colon and stomach. There is no evidence of pneumoperitoneum, portal  venous gas or pneumatosis. There are no pathologic calcifications along the expected course of the ureters. The osseous structures are unremarkable. IMPRESSION: No nasogastric tube identified in the lower chest or abdomen. Electronically Signed   By: Kathreen Devoid   On: 01/20/2018 12:42   Dg Abd 1 View  Result Date: 01/20/2018 CLINICAL DATA:  Abdominal pain. EXAM: ABDOMEN - 1 VIEW COMPARISON:  None. FINDINGS: A right-sided pleural effusion is suspected layering posteriorly. Pacer paddles are noted on the chest. The bowel gas pattern is unremarkable. Air and stool scattered throughout the colon. A few scattered air-filled small bowel loops. No obvious free air. A right femoral line is noted. Bony structures are unremarkable. IMPRESSION: Suspect right pleural effusion. No plain film findings for an acute abdominal process. Electronically Signed   By: Marijo Sanes M.D.   On: 01/20/2018 11:22  Dg Chest Port 1 View  Result Date: 01/21/2018 CLINICAL DATA:  Acute respiratory failure. EXAM: PORTABLE CHEST 1 VIEW COMPARISON:  01/20/2018 FINDINGS: The endotracheal tube is in good position at the mid tracheal level. The lungs demonstrate improved aeration with resolving edema and atelectasis. Probable persistent small layering right pleural effusion. IMPRESSION: Stable position of the endotracheal tube. Improved lung aeration with resolving edema and atelectasis. Electronically Signed   By: Marijo Sanes M.D.   On: 01/21/2018 08:36   Dg Addison Bailey G Tube Plc W/fl W/rad  Result Date: 01/20/2018 CLINICAL DATA:  Patient presents for Dobbhoff tube placement. EXAM: NASO G TUBE PLACEMENT WITH FL AND WITH RAD FLUOROSCOPY TIME:  Fluoroscopy Time:  0.3 minute Radiation Exposure Index (if provided by the fluoroscopic device): 1.3 mGy Number of Acquired Spot Images: 0 COMPARISON:  None. FINDINGS: Multiple attempts at placing a Dobbhoff tube were made through the right and left nare. The tip of the Dobbhoff tube would not  progressed beyond the level of the mid trachea on repeated attempts. Attempts were made at advancing the Dobbhoff tube following deflating the tracheostomy cuff, but the Dobbhoff tube could not advance any further. Examination was terminated at this time. IMPRESSION: 1. Multiple unsuccessful attempts at placing a Dobbhoff tube which would not progressed beyond the level of the mid trachea Electronically Signed   By: Kathreen Devoid   On: 01/20/2018 16:41      Impression / Plan:   JAQUA CHING is a 57 y.o. y/o male with acute respiratory failure on presentation, with CT showing displacement of the airway due to large left lobe thyroid nodule, with GI initially consulted for assistance in placing dob hoff tube while patient was intubated, now subsequently extubated and tolerating oral diet  Patient was cleared by speech and swallow therapy for oral diet He states he is tolerating it well without dysphagia, odynophagia, coughing or choking spells during swallowing Difficulty with dobhoff placement was likely due to his trachea deviation  No indication for endoscopy at this time as dobhoff tube placement is not needed anymore. Risks of EGD would outweigh any benefits given his trachea deviation and large thyroid nodule.  Would recommend antireflux measures Keep head of bed elevated to 30 degrees at all times His sore throat is likely due to his airway displacement and need for intubation during his presentation but it is not affecting his swallowing clinically  Would recommend once daily PPI for 14-30 days to see if it helps with his sore throat or globus sensation.  Please page GI if we can be of assistance, or if patient has dysphagia or with any other questions Follow up in GI clinic as an outpatient. GI service will sign off  Thank you for involving me in the care of this patient.      LOS: 2 days   Virgel Manifold, MD  01/22/2018, 9:22 AM

## 2018-01-22 NOTE — Progress Notes (Signed)
[facility]  SUBJECTIVE: Patient laying in bed, denies chest pain   Vitals:   01/22/18 0600 01/22/18 0700 01/22/18 0723 01/22/18 0800  BP: (!) 152/98 (!) 153/95  (!) 173/109  Pulse: 88 89  94  Resp: 20 (!) 23  (!) 28  Temp:    98 F (36.7 C)  TempSrc:    Axillary  SpO2: 96% 98% 98% 97%  Weight:      Height:         Intake/Output Summary (Last 24 hours) at 01/22/2018 6568 Last data filed at 01/22/2018 0800 Gross per 24 hour  Intake 1025.23 ml  Output 2050 ml  Net -1024.77 ml      PHYSICAL EXAM  General: Well developed, well nourished, in no acute distress HEENT:  Normocephalic and atramatic Neck:  No JVD.  Lungs: Clear bilaterally to auscultation and percussion. Heart: HRRR . Normal S1 and S2 without gallops or murmurs.  Abdomen: Bowel sounds are positive, abdomen soft and non-tender  Msk:  Back normal, normal gait. Normal strength and tone for age. Extremities: No clubbing, cyanosis or edema.   Neuro: Alert and oriented X 3. Psych:  Good affect, responds appropriately   LABS: Basic Metabolic Panel: Recent Labs    01/20/18 0231 01/21/18 0536 01/22/18 0617  NA 141 144 139  K 3.4* 3.8 3.5  CL 106 113* 108  CO2 22 24 24   GLUCOSE 398* 172* 163*  BUN 14 27* 28*  CREATININE 0.97 1.64* 1.07  CALCIUM 8.8* 8.0* 8.2*  MG 1.9 2.1  --   PHOS 4.5 3.0  --    Liver Function Tests: Recent Labs    01/20/18 0231  AST 46*  ALT 36  ALKPHOS 62  BILITOT 0.6  PROT 8.4*  ALBUMIN 3.6   Recent Labs    01/20/18 0231 01/20/18 0634  LIPASE 47  --   AMYLASE  --  91   CBC: Recent Labs    01/20/18 0231 01/21/18 0536  WBC 27.3* 18.6*  NEUTROABS 12.8*  --   HGB 14.7 12.1*  HCT 43.9 35.8*  MCV 95.2 93.9  PLT 284 219   Cardiac Enzymes: Recent Labs    01/20/18 1342 01/20/18 2023 01/21/18 0536  TROPONINI 0.45* 0.42* 0.36*   BNP: Invalid input(s): POCBNP D-Dimer: No results for input(s): DDIMER in the last 72 hours. Hemoglobin A1C: No results for  input(s): HGBA1C in the last 72 hours. Fasting Lipid Panel: Recent Labs    01/20/18 0459  CHOL 191  HDL NOT REPORTED DUE TO HIGH TRIGLYCERIDES  LDLCALC UNABLE TO CALCULATE IF TRIGLYCERIDE OVER 400 mg/dL  TRIG 1,658*  CHOLHDL NOT REPORTED DUE TO HIGH TRIGLYCERIDES   Thyroid Function Tests: Recent Labs    01/20/18 0231 01/21/18 2300  TSH 4.696* 0.723  T3FREE 3.7  --    Anemia Panel: No results for input(s): VITAMINB12, FOLATE, FERRITIN, TIBC, IRON, RETICCTPCT in the last 72 hours.  Dg Abd 1 View  Result Date: 01/20/2018 CLINICAL DATA:  Encounter for NG tube placement EXAM: ABDOMEN - 1 VIEW COMPARISON:  01/20/2018 FINDINGS: Malpositioned nasogastric tube, coiled backwards overlying the mid esophagus with tip excluded from view Endotracheal tube overlies the tracheal air column with tip superior to the carina. No pneumothorax. Pacer leads overlie the left ventricular apex. Pulmonary vasculature remains indistinct with cephalization of flow. Suspected trace right-sided pleural effusion. No definite left-sided pleural effusion, though the left costophrenic angle is excluded from view. Nonobstructive bowel gas pattern. No pneumoperitoneum, pneumatosis or portal venous gas. IMPRESSION:  1. Malpositioned nasogastric tube.  Repositioning is advised. 2. Similar findings of pulmonary edema and trace right-sided pleural effusion. 3. Nonobstructive bowel gas pattern. Electronically Signed   By: Sandi Mariscal M.D.   On: 01/20/2018 14:03   Dg Abd 1 View  Result Date: 01/20/2018 CLINICAL DATA:  NG tube placement EXAM: ABDOMEN - 1 VIEW COMPARISON:  None. FINDINGS: There is no nasogastric tube identified in the lower chest or abdomen. There is mild gaseous distension of the colon and stomach. There is no evidence of pneumoperitoneum, portal venous gas or pneumatosis. There are no pathologic calcifications along the expected course of the ureters. The osseous structures are unremarkable. IMPRESSION: No  nasogastric tube identified in the lower chest or abdomen. Electronically Signed   By: Kathreen Devoid   On: 01/20/2018 12:42   Dg Abd 1 View  Result Date: 01/20/2018 CLINICAL DATA:  Abdominal pain. EXAM: ABDOMEN - 1 VIEW COMPARISON:  None. FINDINGS: A right-sided pleural effusion is suspected layering posteriorly. Pacer paddles are noted on the chest. The bowel gas pattern is unremarkable. Air and stool scattered throughout the colon. A few scattered air-filled small bowel loops. No obvious free air. A right femoral line is noted. Bony structures are unremarkable. IMPRESSION: Suspect right pleural effusion. No plain film findings for an acute abdominal process. Electronically Signed   By: Marijo Sanes M.D.   On: 01/20/2018 11:22   Dg Chest Port 1 View  Result Date: 01/21/2018 CLINICAL DATA:  Acute respiratory failure. EXAM: PORTABLE CHEST 1 VIEW COMPARISON:  01/20/2018 FINDINGS: The endotracheal tube is in good position at the mid tracheal level. The lungs demonstrate improved aeration with resolving edema and atelectasis. Probable persistent small layering right pleural effusion. IMPRESSION: Stable position of the endotracheal tube. Improved lung aeration with resolving edema and atelectasis. Electronically Signed   By: Marijo Sanes M.D.   On: 01/21/2018 08:36   Dg Addison Bailey G Tube Plc W/fl W/rad  Result Date: 01/20/2018 CLINICAL DATA:  Patient presents for Dobbhoff tube placement. EXAM: NASO G TUBE PLACEMENT WITH FL AND WITH RAD FLUOROSCOPY TIME:  Fluoroscopy Time:  0.3 minute Radiation Exposure Index (if provided by the fluoroscopic device): 1.3 mGy Number of Acquired Spot Images: 0 COMPARISON:  None. FINDINGS: Multiple attempts at placing a Dobbhoff tube were made through the right and left nare. The tip of the Dobbhoff tube would not progressed beyond the level of the mid trachea on repeated attempts. Attempts were made at advancing the Dobbhoff tube following deflating the tracheostomy cuff, but the  Dobbhoff tube could not advance any further. Examination was terminated at this time. IMPRESSION: 1. Multiple unsuccessful attempts at placing a Dobbhoff tube which would not progressed beyond the level of the mid trachea Electronically Signed   By: Kathreen Devoid   On: 01/20/2018 16:41     Echo pending  TELEMETRY: Sinus tachycardia:  ASSESSMENT AND PLAN:  Active Problems:   Acute hypoxemic respiratory failure ([place])    1.  Respiratory failure, likely multifactorial, secondary to acute diastolic congestive heart failure due to hypertensive emergency, and possible bronchitis/pneumonia, extubated, gradually improving 2.  Mildly elevated troponin, in the setting of respiratory failure, in the absence of chest pain or new ECG changes, likely demand supply ischemia 3.  Essential hypertension, currently stable  Recommendations  1.  Continue current therapy 2.  Defer full dose anticoagulation 3.  Review 2D echocardiogram 4.  Defer cardiac catheterization at this time   Isaias Cowman, MD, PhD, [facility] 01/22/2018 9:29 AM

## 2018-01-22 NOTE — Progress Notes (Signed)
Pt alert and Oriented x4 .Denies having pain and SOB at this time. On 4L of [place], lung sounds clear. Will continue to monitor.

## 2018-01-22 NOTE — Progress Notes (Signed)
Dr. Celesta Aver notified of elevated BP 173/111. Will give 25 mg of hydralazine. See new order per. Dr. Celesta Aver. Pt is asymptomatic. will continue to monitor.

## 2018-01-23 DIAGNOSIS — I420 Dilated cardiomyopathy: Secondary | ICD-10-CM

## 2018-01-23 LAB — CBC WITH DIFFERENTIAL/PLATELET
BASOS ABS: 0 10*3/uL (ref 0–0.1)
Basophils Relative: 0 %
EOS ABS: 0 10*3/uL (ref 0–0.7)
Eosinophils Relative: 0 %
HCT: 34.7 % — ABNORMAL LOW (ref 40.0–52.0)
HEMOGLOBIN: 11.7 g/dL — AB (ref 13.0–18.0)
LYMPHS ABS: 3.2 10*3/uL (ref 1.0–3.6)
LYMPHS PCT: 17 %
MCH: 31.5 pg (ref 26.0–34.0)
MCHC: 33.8 g/dL (ref 32.0–36.0)
MCV: 93.1 fL (ref 80.0–100.0)
MONO ABS: 3.2 10*3/uL — AB (ref 0.2–1.0)
Monocytes Relative: 17 %
NEUTROS ABS: 12.7 10*3/uL — AB (ref 1.4–6.5)
Neutrophils Relative %: 66 %
Platelets: 204 10*3/uL (ref 150–440)
RBC: 3.73 MIL/uL — ABNORMAL LOW (ref 4.40–5.90)
RDW: 13 % (ref 11.5–14.5)
WBC: 19.1 10*3/uL — ABNORMAL HIGH (ref 3.8–10.6)

## 2018-01-23 LAB — BASIC METABOLIC PANEL
ANION GAP: 8 (ref 5–15)
BUN: 21 mg/dL — AB (ref 6–20)
CALCIUM: 8.2 mg/dL — AB (ref 8.9–10.3)
CO2: 24 mmol/L (ref 22–32)
CREATININE: 0.99 mg/dL (ref 0.61–1.24)
Chloride: 108 mmol/L (ref 98–111)
GFR calc Af Amer: >60 mL/min (ref >60)
GLUCOSE: 146 mg/dL — AB (ref 70–99)
Potassium: 3.5 mmol/L (ref 3.5–5.1)
Sodium: 140 mmol/L (ref 135–145)

## 2018-01-23 LAB — GLUCOSE, CAPILLARY
GLUCOSE-CAPILLARY: 123 mg/dL — AB (ref 70–99)
GLUCOSE-CAPILLARY: 193 mg/dL — AB (ref 70–99)
GLUCOSE-CAPILLARY: 124 mg/dL — AB (ref 70–99)
Glucose-Capillary: 144 mg/dL — ABNORMAL HIGH (ref 70–99)
Glucose-Capillary: 182 mg/dL — ABNORMAL HIGH (ref 70–99)

## 2018-01-23 LAB — LEGIONELLA PNEUMOPHILA SEROGP 1 UR AG: L. pneumophila Serogp 1 Ur Ag: NEGATIVE

## 2018-01-23 LAB — MAGNESIUM: MAGNESIUM: 2 mg/dL (ref 1.7–2.4)

## 2018-01-23 MED ORDER — INSULIN ASPART 100 UNIT/ML ~~LOC~~ SOLN
0.0000 [IU] | Freq: Three times a day (TID) | SUBCUTANEOUS | Status: DC
  Administered 2018-01-23: 3 [IU] via SUBCUTANEOUS
  Administered 2018-01-23: 2 [IU] via SUBCUTANEOUS
  Administered 2018-01-24: 5 [IU] via SUBCUTANEOUS
  Administered 2018-01-24: 3 [IU] via SUBCUTANEOUS
  Filled 2018-01-23 (×4): qty 1

## 2018-01-23 MED ORDER — IPRATROPIUM-ALBUTEROL 0.5-2.5 (3) MG/3ML IN SOLN
3.0000 mL | Freq: Four times a day (QID) | RESPIRATORY_TRACT | Status: DC | PRN
Start: 2018-01-23 — End: 2018-01-24

## 2018-01-23 MED ORDER — INSULIN ASPART 100 UNIT/ML ~~LOC~~ SOLN: 0.0000 [IU] | Freq: Every day | SUBCUTANEOUS | Status: DC

## 2018-01-23 MED ORDER — ENOXAPARIN SODIUM 40 MG/0.4ML ~~LOC~~ SOLN
40.0000 mg | SUBCUTANEOUS | Status: DC
  Administered 2018-01-24: 40 mg via SUBCUTANEOUS
  Filled 2018-01-23: qty 0.4

## 2018-01-23 MED ORDER — POTASSIUM CHLORIDE CRYS ER 20 MEQ PO TBCR
40.0000 meq | EXTENDED_RELEASE_TABLET | Freq: Every day | ORAL | Status: DC
  Administered 2018-01-23 – 2018-01-24 (×2): 40 meq via ORAL
  Filled 2018-01-23 (×2): qty 2

## 2018-01-23 NOTE — Progress Notes (Signed)
Pt refusing bipap, placed back on 2L [place], O2 sats at 97%, will continue to monitor.

## 2018-01-23 NOTE — Progress Notes (Signed)
Patient transferred to room 213 by wheelchair with Enzo Montgomery, RN.  Patient moved on tele and room air.

## 2018-01-23 NOTE — Progress Notes (Signed)
Pt stated he had not had a bowel movement since two weeks before he was admitted, upon further assessment he stated his last BM was two days before he was actually admitted. Dulcolax given.

## 2018-01-23 NOTE — Progress Notes (Signed)
Cognition intact Comfortable on [place] O2 No new complaints  Vitals:   01/23/18 1000 01/23/18 1100 01/23/18 1200 01/23/18 1329  BP: (!) 147/83 (!) 137/92    Pulse: 97 84 88 94  Resp: (!) 31 (!) 26 (!) 23 17  Temp:   100 F (37.8 C)   TempSrc:   Oral   SpO2: 97% 97% 96% 92%  Weight:      Height:       Gen: NAD HEENT: NCAT, sclera white Neck: No JVD Lungs: No wheezes, bibasilar crackles Cardiovascular: RRR, no murmurs Abdomen: Soft, nontender, normal BS Ext: without clubbing, cyanosis, edema Neuro: grossly intact Skin: Limited exam, no lesions noted  BMP Latest Ref Rng & Units 01/23/2018 01/22/2018 01/21/2018  Glucose 70 - 99 mg/dL 146(H) 163(H) 172(H)  BUN 6 - 20 mg/dL 21(H) 28(H) 27(H)  Creatinine 0.61 - 1.24 mg/dL 0.99 1.07 1.64(H)  Sodium 135 - 145 mmol/L 140 139 144  Potassium 3.5 - 5.1 mmol/L 3.5 3.5 3.8  Chloride 98 - 111 mmol/L 108 108 113(H)  CO2 22 - 32 mmol/L 24 24 24   Calcium 8.9 - 10.3 mg/dL 8.2(L) 8.2(L) 8.0(L)   CBC Latest Ref Rng & Units 01/23/2018 01/21/2018 01/20/2018  WBC 3.8 - 10.6 K/uL 19.1(H) 18.6(H) 27.3(H)  Hemoglobin 13.0 - 18.0 g/dL 11.7(L) 12.1(L) 14.7  Hematocrit 40.0 - 52.0 % 34.7(L) 35.8(L) 43.9  Platelets 150 - 440 K/uL 204 219 284    CXR: No new film  IMPRESSION: Acute hypoxemic/hypercarbic respiratory failure, resolving Pulmonary edema, resolving Possible pneumonia Multinodular goiter History of OSA Smoker Hypertension, controlled Dilated cardiomyopathy (LVEF 20-25% by echocardiogram 7/19) DM2  PLAN/REC: Transfer to [place] with cardiac monitoring Discontinue vancomycin Continue cefepime for now CXR, PCT in a.m. 7/23  Consider DC ABX if no evidence of active infection Cardiology following for dilated cardiomyopathy Continue Lantus and SSI Continue nocturnal BiPAP Counseled regarding smoking cessation  After transfer, PCCM will sign off. Please call if we can be of further assistance    Merton Border, MD PCCM service Mobile  [phone] Pager [phone] 01/23/2018 1:36 PM

## 2018-01-23 NOTE — Progress Notes (Signed)
Nutrition Follow-up  DOCUMENTATION CODES:   Obesity unspecified  INTERVENTION:  Will continue to monitor PO intake and make appropriate nutrition recommendations as needed. At this time no indication for ONS as patient is reporting good appetite and intake following extubation.  NUTRITION DIAGNOSIS:   Inadequate oral intake related to inability to eat(intubated) as evidenced by NPO status.  Resolving as diet has been advanced.  GOAL:   Patient will meet greater than or equal to 90% of their needs  Progressing.  MONITOR:   Vent status, TF tolerance, I & O's  REASON FOR ASSESSMENT:   Ventilator    ASSESSMENT:   Patient with PMH OSA, HTN, DM, Multinodular Goiter, Everyday Smoker. 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   -Patient was extubated on 7/20. He was only intubated a little over 24 hours. -Patient was advanced to dysphagia 3 diet with thin liquids on 7/20. -Advanced to regular diet today.  Met with patient and his wife at bedside. He was sitting up in chair. He reports his appetite was good PTA. He typically eats 100% of 2 meals and 2 snacks daily. He reports his intake is slightly decreased here because he is tired of the mechanical soft diet. He wants to be able more regular-texture foods such as sandwiches. He reports he is ordering adequate protein at meal and is hoping to be able to start ambulating today. Denies any N/V, abdominal pain, difficulty chewing/swallowing.  Medications reviewed and include: Lasix 20 mg daily, Novolog 0-15 units TID, Novolog 0-5 units QHS, Lantus 10 units daily, potassium chloride 40 mEQ daily, cefepime.  Labs reviewed: CBG 144-219, BUN 21.  Discussed with RN and on rounds.  Diet Order:   Diet Order           Diet regular Room service appropriate? Yes; Fluid consistency: Thin  Diet effective now          EDUCATION NEEDS:   No education needs  have been identified at this time  Skin:  Skin Assessment: Reviewed RN Assessment  Last BM:  PTA  Height:   Ht Readings from Last 1 Encounters:  01/20/18 5' 10"  (1.778 m)    Weight:   Wt Readings from Last 1 Encounters:  01/23/18 229 lb 0.9 oz (103.9 kg)    Ideal Body Weight:  75.45 kg  BMI:  Body mass index is 32.87 kg/m.  Estimated Nutritional Needs:   Kcal:  2841-3244 (MSJ x 1.2-1.3)  Protein:  100-120 grams (1-1.2 grams/kg)  Fluid:  2.2 L/day (30 mL/kg IBW)  Willey Blade, MS, RD, LDN Office: [phone] Pager: [phone] After Hours/Weekend Pager: [phone]

## 2018-01-23 NOTE — Progress Notes (Signed)
Inpatient Diabetes Program Recommendations  AACE/ADA: New Consensus Statement on Inpatient Glycemic Control (2019)  Target Ranges:  Prepandial:   less than 140 mg/dL      Peak postprandial:   less than 180 mg/dL (1-2 hours)      Critically ill patients:  140 - 180 mg/dL   Results for [name], [name] (MRN [id number]) as of 01/23/2018 10:18  Ref. Range 01/22/2018 07:10 01/22/2018 11:28 01/22/2018 16:09 01/22/2018 19:40 01/22/2018 23:56 01/23/2018 03:28 01/23/2018 08:21  Glucose-Capillary Latest Ref Range: 70 - 99 mg/dL 127 (H) 185 (H) 207 (H) 219 (H) 129 (H) 123 (H) 144 (H)   Review of Glycemic Control  Diabetes history: DM2 Outpatient Diabetes medications: Metformin XR 2000 mg daily, Onglyza 5 mg daily Current orders for Inpatient glycemic control: Lantus 10 units daily, Novolog 0-15 units TID with meals, Novolog 0-5 units QHS  Inpatient Diabetes Program Recommendations: Insulin - Meal Coverage: Please consider ordering Novolog 4 units TID with meals for meal coverage if patient eats at least 50% of meals. HgbA1C: Please consider ordering an A1C to evaluate glycemic control over the past 2-3 months.  Thanks, Barnie Alderman, RN, MSN, CDE Diabetes Coordinator Inpatient Diabetes Program [phone] (Team Pager from 8am to 5pm)

## 2018-01-23 NOTE — Progress Notes (Signed)
Report called to Rodman Key, RN on [place].  Patient is sitting up in bed eating lunch with wife at bedside.

## 2018-01-23 NOTE — Progress Notes (Signed)
[place] [facility] [place] NAME: [name]    MR#:  [id number]  DATE OF BIRTH:  1961/03/03  SUBJECTIVE:  CHIEF COMPLAINT:   Chief Complaint  Patient presents with  . Respiratory Distress  Case discussed with intensivist, patient doing well, possible transfer to floor later today  REVIEW OF SYSTEMS:  CONSTITUTIONAL: No fever, fatigue or weakness.  EYES: No blurred or double vision.  EARS, NOSE, AND THROAT: No tinnitus or ear pain.  RESPIRATORY: No cough, shortness of breath, wheezing or hemoptysis.  CARDIOVASCULAR: No chest pain, orthopnea, edema.  GASTROINTESTINAL: No nausea, vomiting, diarrhea or abdominal pain.  GENITOURINARY: No dysuria, hematuria.  ENDOCRINE: No polyuria, nocturia,  HEMATOLOGY: No anemia, easy bruising or bleeding SKIN: No rash or lesion. MUSCULOSKELETAL: No joint pain or arthritis.   NEUROLOGIC: No tingling, numbness, weakness.  PSYCHIATRY: No anxiety or depression.   ROS  DRUG ALLERGIES:   Allergies  Allergen Reactions  . Enalapril Maleate Anaphylaxis  . Iodinated Diagnostic Agents Itching  . Isosorbide Mononitrate Er [Isosorbide Dinitrate] Other (See Comments)    "made me not be able to think or function"  . Canagliflozin Other (See Comments)    VITALS:  Blood pressure (!) 137/92, pulse 94, temperature 100 F (37.8 C), temperature source Oral, resp. rate 17, height 5\' 10"  (1.778 m), weight 103.9 kg (229 lb 0.9 oz), SpO2 92 %.  PHYSICAL EXAMINATION:  GENERAL:  57 y.o.-year-old patient lying in the bed with no acute distress.  EYES: Pupils equal, round, reactive to light and accommodation. No scleral icterus. Extraocular muscles intact.  HEENT: Head atraumatic, normocephalic. Oropharynx and nasopharynx clear.  NECK:  Supple, no jugular venous distention. No thyroid enlargement, no tenderness.  LUNGS: Normal breath sounds bilaterally, no wheezing, rales,rhonchi or crepitation. No use of accessory muscles of  respiration.  CARDIOVASCULAR: S1, S2 normal. No murmurs, rubs, or gallops.  ABDOMEN: Soft, nontender, nondistended. Bowel sounds present. No organomegaly or mass.  EXTREMITIES: No pedal edema, cyanosis, or clubbing.  NEUROLOGIC: Cranial nerves II through XII are intact. Muscle strength 5/5 in all extremities. Sensation intact. Gait not checked.  PSYCHIATRIC: The patient is alert and oriented x 3.  SKIN: No obvious rash, lesion, or ulcer.   Physical Exam LABORATORY PANEL:   CBC Recent Labs  Lab 01/23/18 0538  WBC 19.1*  HGB 11.7*  HCT 34.7*  PLT 204   ------------------------------------------------------------------------------------------------------------------  Chemistries  Recent Labs  Lab 01/20/18 0231  01/23/18 0538  NA 141   < > 140  K 3.4*   < > 3.5  CL 106   < > 108  CO2 22   < > 24  GLUCOSE 398*   < > 146*  BUN 14   < > 21*  CREATININE 0.97   < > 0.99  CALCIUM 8.8*   < > 8.2*  MG 1.9   < > 2.0  AST 46*  --   --   ALT 36  --   --   ALKPHOS 62  --   --   BILITOT 0.6  --   --    < > = values in this interval not displayed.   ------------------------------------------------------------------------------------------------------------------  Cardiac Enzymes Recent Labs  Lab 01/20/18 2023 01/21/18 0536  TROPONINI 0.42* 0.36*   ------------------------------------------------------------------------------------------------------------------  RADIOLOGY:  No results found.  ASSESSMENT AND PLAN:  Patient is 57 year old with acute respiratory failure  *Acute hypoxic respiratory failure  Due to multifactorial processes-congestive heart failure and bilateral pleural effusions Status  post extubation January 23, 3952   *acute systolic CHF ex with bilateral pleural effusion Continue congestive heart failure protocol, IV Lasix, strict I&O monitoring, daily weights, supplemental oxygen with weaning as tolerated, for repeat chest x-ray tomorrow, for transfer to  floor later today  *Chronic diabetes type II Stable on current regiment   *Hypertension  Stable on Cozaar  *Elevated troponin likely due to demand ischemia  *Sleep apnea  Stable Bipap HS  *Goiter  Stable T3/T4 ,TSH are normal   All the records are reviewed and case discussed with Care Management/Social Workerr. Management plans discussed with the patient, family and they are in agreement.  CODE STATUS:full  TOTAL TIME TAKING CARE OF THIS PATIENT: 45 minutes.     POSSIBLE D/C IN 1-3 DAYS, DEPENDING ON CLINICAL CONDITION.   [name] M.D on 01/23/2018   Between 7am to 6pm - Pager - [phone]  After 6pm go to [email or web address] - password [email or web address] [facility]  Office  [phone]  CC: Primary care physician; Marguerita Merles, MD  Note: This dictation was prepared with Dragon dictation along with smaller phrase technology. Any transcriptional errors that result from this process are unintentional.

## 2018-01-24 ENCOUNTER — Inpatient Hospital Stay: Payer: BLUE CROSS/BLUE SHIELD

## 2018-01-24 LAB — BASIC METABOLIC PANEL
Anion gap: 5 (ref 5–15)
BUN: 18 mg/dL (ref 6–20)
CALCIUM: 8.5 mg/dL — AB (ref 8.9–10.3)
CO2: 28 mmol/L (ref 22–32)
Chloride: 105 mmol/L (ref 98–111)
Creatinine, Ser: 0.96 mg/dL (ref 0.61–1.24)
GFR calc Af Amer: >60 mL/min (ref >60)
GLUCOSE: 183 mg/dL — AB (ref 70–99)
Potassium: 3.6 mmol/L (ref 3.5–5.1)
Sodium: 138 mmol/L (ref 135–145)

## 2018-01-24 LAB — CBC WITH DIFFERENTIAL/PLATELET
BASOS ABS: 0 10*3/uL (ref 0–0.1)
Basophils Relative: 0 %
EOS ABS: 0.2 10*3/uL (ref 0–0.7)
Eosinophils Relative: 1 %
HEMATOCRIT: 36 % — AB (ref 40.0–52.0)
Hemoglobin: 12.1 g/dL — ABNORMAL LOW (ref 13.0–18.0)
LYMPHS ABS: 3.6 10*3/uL (ref 1.0–3.6)
Lymphocytes Relative: 21 %
MCH: 31.3 pg (ref 26.0–34.0)
MCHC: 33.5 g/dL (ref 32.0–36.0)
MCV: 93.4 fL (ref 80.0–100.0)
MONOS PCT: 16 %
Monocytes Absolute: 2.8 10*3/uL — ABNORMAL HIGH (ref 0.2–1.0)
Neutro Abs: 10.6 10*3/uL — ABNORMAL HIGH (ref 1.4–6.5)
Neutrophils Relative %: 62 %
PLATELETS: 233 10*3/uL (ref 150–440)
RBC: 3.85 MIL/uL — AB (ref 4.40–5.90)
RDW: 12.9 % (ref 11.5–14.5)
WBC: 17.2 10*3/uL — AB (ref 3.8–10.6)

## 2018-01-24 LAB — GLUCOSE, CAPILLARY
GLUCOSE-CAPILLARY: 170 mg/dL — AB (ref 70–99)
GLUCOSE-CAPILLARY: 187 mg/dL — AB (ref 70–99)
Glucose-Capillary: 175 mg/dL — ABNORMAL HIGH (ref 70–99)
Glucose-Capillary: 220 mg/dL — ABNORMAL HIGH (ref 70–99)

## 2018-01-24 LAB — PROCALCITONIN: Procalcitonin: 0.14 ng/mL

## 2018-01-24 MED ORDER — METOPROLOL TARTRATE 50 MG PO TABS: 50.0000 mg | ORAL_TABLET | Freq: Two times a day (BID) | ORAL | 0 refills | Status: AC

## 2018-01-24 MED ORDER — MOXIFLOXACIN HCL 400 MG PO TABS: 400.0000 mg | ORAL_TABLET | Freq: Every day | ORAL | 0 refills | Status: AC

## 2018-01-24 MED ORDER — HYDRALAZINE HCL 25 MG PO TABS: 25.0000 mg | ORAL_TABLET | Freq: Three times a day (TID) | ORAL | 0 refills | Status: AC

## 2018-01-24 MED ORDER — FUROSEMIDE 20 MG PO TABS: 20.0000 mg | ORAL_TABLET | Freq: Every day | ORAL | 0 refills | Status: AC

## 2018-01-24 NOTE — Evaluation (Signed)
Physical Therapy Evaluation Patient Details Name: [name] MRN: [id number] DOB: 12-29-1960 Today's Date: 01/24/2018   History of Present Illness   Patient is 57 year old with acute respiratory failure intubated from 01/20/18 to 01/22/18.  Assessment also includes acute systolic CHF with bilateral pleural effusion, chronic DM II, HTN, elevated troponin due to demand ischemia, and sleep apnea.     Clinical Impression  Pt Ind with all functional mobility and gait with no assistive devises.  Pt mod Ind ascending and descending 6 steps with one rail.  Pt's SpO2 dropped from baseline of 94% to 93% after ambulation and stair assessment with HR increasing from 88 to 94 bpm.  Pt reported no adverse symptoms during session.  Will complete PT orders at this time with no follow up PT recommended.  Will reassess pt pending a change in status upon receipt of new PT orders.     Follow Up Recommendations No PT follow up    Equipment Recommendations  None recommended by PT    Recommendations for Other Services       Precautions / Restrictions Precautions Precautions: None Restrictions Weight Bearing Restrictions: No      Mobility  Bed Mobility Overal bed mobility: Independent                Transfers Overall transfer level: Independent                  Ambulation/Gait Ambulation/Gait assistance: Independent Gait Distance (Feet): 200 Feet Assistive device: None Gait Pattern/deviations: WFL(Within Functional Limits)     General Gait Details: Good stability and gait speed without AD  Stairs Stairs: Yes Stairs assistance: Modified independent (Device/Increase time) Stair Management: One rail Left Number of Stairs: 6 General stair comments: Reciprocal pattern with good eccentric and concentric control and stability  Wheelchair Mobility    Modified Rankin (Stroke Patients Only)       Balance Overall balance assessment: No apparent balance deficits (not formally  assessed)                                           Pertinent Vitals/Pain Pain Assessment: No/denies pain    Home Living Family/patient expects to be discharged to:: Private residence Living Arrangements: Spouse/significant other Available Help at Discharge: Family;Available 24 hours/day Type of Home: House Home Access: Stairs to enter Entrance Stairs-Rails: Left Entrance Stairs-Number of Steps: 3 Home Layout: Two level;Able to live on main level with bedroom/bathroom Home Equipment: None      Prior Function Level of Independence: Independent         Comments: Ind amb community distances without AD, no fall history, Ind with ADLs     Hand Dominance        Extremity/Trunk Assessment   Upper Extremity Assessment Upper Extremity Assessment: Overall WFL for tasks assessed    Lower Extremity Assessment Lower Extremity Assessment: Overall WFL for tasks assessed       Communication   Communication: No difficulties  Cognition Arousal/Alertness: Awake/alert Behavior During Therapy: WFL for tasks assessed/performed Overall Cognitive Status: Within Functional Limits for tasks assessed                                        General Comments      Exercises     Assessment/Plan  PT Assessment Patent does not need any further PT services  PT Problem List         PT Treatment Interventions      PT Goals (Current goals can be found in the Care Plan section)  Acute Rehab PT Goals PT Goal Formulation: All assessment and education complete, DC therapy    Frequency     Barriers to discharge        Co-evaluation               AM-PAC PT "6 Clicks" Daily Activity  Outcome Measure Difficulty turning over in bed (including adjusting bedclothes, sheets and blankets)?: None Difficulty moving from lying on back to sitting on the side of the bed? : None Difficulty sitting down on and standing up from a chair with arms (e.g.,  wheelchair, bedside commode, etc,.)?: None Help needed moving to and from a bed to chair (including a wheelchair)?: None Help needed walking in hospital room?: None Help needed climbing 3-5 steps with a railing? : None 6 Click Score: 24    End of Session   Activity Tolerance: Patient tolerated treatment well Patient left: in chair;with call bell/phone within reach Nurse Communication: Mobility status PT Visit Diagnosis: Muscle weakness (generalized) (M62.81)    Time: 2023-3435 PT Time Calculation (min) (ACUTE ONLY): 20 min   Charges:   PT Evaluation $PT Eval Low Complexity: 1 Low     PT G Codes:        DRoyetta Asal PT, DPT 01/24/18, 12:07 PM

## 2018-01-24 NOTE — Discharge Summary (Signed)
[name] at [place] NAME: [name]    MR#:  [id number]  DATE OF BIRTH:  16-Aug-1960  DATE OF ADMISSION:  01/20/2018 ADMITTING PHYSICIAN: [name]  DATE OF DISCHARGE: No discharge date for patient encounter.  PRIMARY CARE PHYSICIAN: Tyler Merles, Powell    ADMISSION DIAGNOSIS:  Acidosis [E87.2] Demand ischemia ([place]) [I24.8] Elevated troponin I level [R74.8] Elevated lactic acid level [R79.89] Acute respiratory failure with hypoxia and hypercapnia ([place]) [J96.01, J96.02] Sepsis, due to unspecified organism ([place]) [A41.9] Fever, unspecified fever cause [R50.9]  DISCHARGE DIAGNOSIS:  Active Problems:   Acute hypoxemic respiratory failure ([place])   SECONDARY DIAGNOSIS:   Past Medical History:  Diagnosis Date  . Chest pain 2019  . Diabetes mellitus without complication ([place])   . Hypertension   . Sleep apnea     HOSPITAL COURSE:  Patient is 57 year old with acute respiratory failure  *Acute hypoxic respiratory failure  Resolved Due to multifactorial processes-congestive heart failure and bilateral pleural effusions S/p extubation January 22, 2018, transferred from ICU on January 24, 3784   *acute systolic CHF ex with bilateral pleural effusion Treated on our CHF protocol, IV Lasix converted to p.o., successfully weaned off oxygen, repeat chest x-ray on day of discharge noted for cardiomegaly/increasing upper lobe groundglass opacities, patient did well, to follow-up with primary care provider in 3 to 5 days status post discharge for reevaluation  *Chronic diabetes type II Stable on current regiment   *Hypertension  Stable on Cozaar  *Elevated troponin likely due to demand ischemia  *Sleep apnea Stable Bipap HS  *Goiter  Stable T3/T4 ,TSH are normal  DISCHARGE CONDITIONS:   stable  CONSULTS OBTAINED:  Treatment Team:  [name] Isaias Cowman, Powell Beverly Gust,  Powell Margaretha Sheffield, Powell  DRUG ALLERGIES:   Allergies  Allergen Reactions  . Enalapril Maleate Anaphylaxis  . Iodinated Diagnostic Agents Itching  . Isosorbide Mononitrate Er [Isosorbide Dinitrate] Other (See Comments)    "made me not be able to think or function"  . Canagliflozin Other (See Comments)    DISCHARGE MEDICATIONS:   Allergies as of 01/24/2018      Reactions   Enalapril Maleate Anaphylaxis   Iodinated Diagnostic Agents Itching   Isosorbide Mononitrate Er [isosorbide Dinitrate] Other (See Comments)   "made me not be able to think or function"   Canagliflozin Other (See Comments)      Medication List    TAKE these medications   aspirin EC 81 MG tablet Take 81 mg by mouth daily.   furosemide 20 MG tablet Commonly known as:  LASIX Take 1 tablet (20 mg total) by mouth daily. Start taking on:  01/25/2018   hydrALAZINE 25 MG tablet Commonly known as:  APRESOLINE Take 1 tablet (25 mg total) by mouth every 8 (eight) hours.   HYDROcodone-acetaminophen 5-325 MG tablet Commonly known as:  NORCO/VICODIN Take 1 tablet by mouth every 4 (four) hours as needed.   losartan 25 MG tablet Commonly known as:  COZAAR Take 25 mg by mouth daily.   metFORMIN 500 MG 24 hr tablet Commonly known as:  GLUCOPHAGE-XR Take 2,000 mg by mouth daily.   metoprolol tartrate 50 MG tablet Commonly known as:  LOPRESSOR Take 1 tablet (50 mg total) by mouth 2 (two) times daily.   moxifloxacin 400 MG tablet Commonly known as:  AVELOX Take 1 tablet (400 mg total) by mouth daily for 7 days.   polyethylene glycol powder powder Commonly known  asDesma Maxim Take 17 g by mouth daily as needed for moderate constipation.   saxagliptin HCl 5 MG Tabs tablet Commonly known as:  ONGLYZA Take 5 mg by mouth daily.        DISCHARGE INSTRUCTIONS:   If you experience worsening of your admission symptoms, develop shortness of breath, life threatening emergency, suicidal or homicidal  thoughts you must seek medical attention immediately by calling 911 or calling your Powell immediately  if symptoms less severe.  You Must read complete instructions/literature along with all the possible adverse reactions/side effects for all the Medicines you take and that have been prescribed to you. Take any new Medicines after you have completely understood and accept all the possible adverse reactions/side effects.   Please note  You were cared for by a hospitalist during your hospital stay. If you have any questions about your discharge medications or the care you received while you were in the hospital after you are discharged, you can call the unit and asked to speak with the hospitalist on call if the hospitalist that took care of you is not available. Once you are discharged, your primary care physician will handle any further medical issues. Please note that NO REFILLS for any discharge medications will be authorized once you are discharged, as it is imperative that you return to your primary care physician (or establish a relationship with a primary care physician if you do not have one) for your aftercare needs so that they can reassess your need for medications and monitor your lab values.    Today   CHIEF COMPLAINT:   Chief Complaint  Patient presents with  . Respiratory Distress    HISTORY OF PRESENT ILLNESS:  57 y.o. male with a known history of obesity, T2NIDDM, HTN p/w respiratory distress, acute hypoxemic respiratory failure. Pt intubated after arriving to ED, sedated at the time of my assessment, Hx/ROS unobtainable from pt. Hx obtained from pt's wife at bedside. ~107mo ago, pt had been suffering from poor PO intake, early satiety, SOB and chest/abdominal discomfort, found to have pancreatitis. He was eventually discharged from the hospital, but continued to have poor PO intake after. In the interrim period, he had a thyroid U/S (+) nodule. Pt was pending Bx of this nodule. Pt's  wife states he has been having non-productive cough, SOB, orthopnea, PND and insomnia for the past 3-4 days/nights, progressively worsening. She has also noticed intermittent leg swelling, which she states was initially unilateral RLE, then unilateral LLE, and she says it did not resolve w/ recumbence (though he does not exhibit pretibial pitting edema on physical examination). On 01/19/2018 evening, pt became severely SOB right before bedtime. Pt's wife called EMS. Pt did not report CP, AP, N/V. He is a smoker, 1ppd, duration unknown (at least 51yrs). (-) EtOH/illicit drugs, (-) pets at home, (-) recent travel, (-) sick contacts. Works for [employer] at [employer]. (+) recent hospitalization ~38mo ago, as discussed above.  VITAL SIGNS:  Blood pressure (!) 144/100, pulse 90, temperature 100 F (37.8 C), temperature source Oral, resp. rate 20, height 5\' 10"  (1.778 m), weight 103.9 kg (229 lb 0.9 oz), SpO2 90 %.  I/O:    Intake/Output Summary (Last 24 hours) at 01/24/2018 1143 Last data filed at 01/23/2018 2100 Gross per 24 hour  Intake 100 ml  Output 100 ml  Net 0 ml    PHYSICAL EXAMINATION:  GENERAL:  57 y.o.-year-old patient lying in the bed with no acute distress.  EYES:  Pupils equal, round, reactive to light and accommodation. No scleral icterus. Extraocular muscles intact.  HEENT: Head atraumatic, normocephalic. Oropharynx and nasopharynx clear.  NECK:  Supple, no jugular venous distention. No thyroid enlargement, no tenderness.  LUNGS: Normal breath sounds bilaterally, no wheezing, rales,rhonchi or crepitation. No use of accessory muscles of respiration.  CARDIOVASCULAR: S1, S2 normal. No murmurs, rubs, or gallops.  ABDOMEN: Soft, non-tender, non-distended. Bowel sounds present. No organomegaly or mass.  EXTREMITIES: No pedal edema, cyanosis, or clubbing.  NEUROLOGIC: Cranial nerves II through XII are intact. Muscle strength 5/5 in all extremities. Sensation intact. Gait not  checked.  PSYCHIATRIC: The patient is alert and oriented x 3.  SKIN: No obvious rash, lesion, or ulcer.   DATA REVIEW:   CBC Recent Labs  Lab 01/24/18 0910  WBC 17.2*  HGB 12.1*  HCT 36.0*  PLT 233    Chemistries  Recent Labs  Lab 01/20/18 0231  01/23/18 0538 01/24/18 0910  NA 141   < > 140 138  K 3.4*   < > 3.5 3.6  CL 106   < > 108 105  CO2 22   < > 24 28  GLUCOSE 398*   < > 146* 183*  BUN 14   < > 21* 18  CREATININE 0.97   < > 0.99 0.96  CALCIUM 8.8*   < > 8.2* 8.5*  MG 1.9   < > 2.0  --   AST 46*  --   --   --   ALT 36  --   --   --   ALKPHOS 62  --   --   --   BILITOT 0.6  --   --   --    < > = values in this interval not displayed.    Cardiac Enzymes Recent Labs  Lab 01/21/18 0536  TROPONINI 0.36*    Microbiology Results  Results for orders placed or performed during the hospital encounter of 01/20/18  Blood Culture (routine x 2)     Status: None (Preliminary result)   Collection Time: 01/20/18  3:25 AM  Result Value Ref Range Status   Specimen Description BLOOD LEFT WRIST  Final   Special Requests   Final    BOTTLES DRAWN AEROBIC AND ANAEROBIC Blood Culture adequate volume   Culture   Final    NO GROWTH 4 DAYS Performed at [facility], [address] [place], [place] [address]    Report Status PENDING  Incomplete  Blood Culture (routine x 2)     Status: None (Preliminary result)   Collection Time: 01/20/18  3:25 AM  Result Value Ref Range Status   Specimen Description BLOOD LEFT HAND   Final   Special Requests   Final    BOTTLES DRAWN AEROBIC AND ANAEROBIC Blood Culture adequate volume   Culture   Final    NO GROWTH 4 DAYS Performed at [facility], [address], [place], [place] [address]    Report Status PENDING  Incomplete  Urine culture     Status: None   Collection Time: 01/20/18  3:25 AM  Result Value Ref Range Status   Specimen Description   Final    URINE, CATHETERIZED Performed at [facility],  [address] [place], [place] [address]    Special Requests   Final    NONE Performed at [facility], [address], [place], [place] [address]    Culture   Final    NO  GROWTH Performed at [facility], [place] [address] [place], [place] [address]    Report Status 01/21/2018 FINAL  Final  MRSA PCR Screening     Status: Abnormal   Collection Time: 01/20/18  3:38 AM  Result Value Ref Range Status   MRSA by PCR POSITIVE (A) NEGATIVE Final    Comment:        The GeneXpert MRSA Assay (FDA approved for NASAL specimens only), is one component of a comprehensive MRSA colonization surveillance program. It is not intended to diagnose MRSA infection nor to guide or monitor treatment for MRSA infections. RESULT CALLED TO, READ BACK BY AND VERIFIED WITH: CHARLI FLEETWOOD AT 0700 01/20/18 SDR Performed at [facility], [address] [place], [place] [address]   Culture, respiratory (NON-Expectorated)     Status: None   Collection Time: 01/20/18  8:27 AM  Result Value Ref Range Status   Specimen Description   Final    TRACHEAL ASPIRATE Performed at [facility], [address] [place], [place] [address]    Special Requests   Final    NONE Performed at [facility], [address] [place], [place] [address]    Gram Stain   Final    RARE WBC PRESENT, PREDOMINANTLY PMN RARE GRAM POSITIVE COCCI FEW GRAM POSITIVE RODS    Culture   Final    FEW Consistent with normal respiratory flora. Performed at [facility], [place] [address] [place], [place] [address]    Report Status 01/22/2018 FINAL  Final    RADIOLOGY:  Dg Chest Port 1 View  Result Date: 01/24/2018 CLINICAL DATA:  Respiratory failure EXAM: PORTABLE CHEST 1 VIEW COMPARISON:  01/21/2018, 01/20/2018, 12/13/2017 FINDINGS: Removal of the endotracheal tube. Slight increased upper lobe ground-glass opacity. Improved aeration at the bases. Stable slightly enlarged  cardiomediastinal silhouette. No pneumothorax. IMPRESSION: 1. Removal of endotracheal tube 2. Improved aeration at the bilateral lung bases. Slight interval increase in bilateral upper lobe ground-glass opacity. 3. Stable mild cardiomegaly Electronically Signed   By: Donavan Foil M.D.   On: 01/24/2018 03:58    EKG:   Orders placed or performed during the hospital encounter of 01/20/18  . ED EKG  . ED EKG  . EKG 12-Lead  . EKG 12-Lead  . EKG 12-Lead  . EKG 12-Lead  . EKG 12-Lead  . EKG 12-Lead      Management plans discussed with the patient, family and they are in agreement.  CODE STATUS:     Code Status Orders  (From admission, onward)        Start     Ordered   01/20/18 0415  Full code  Continuous     01/20/18 0414    Code Status History    This patient has a current code status but no historical code status.    Advance Directive Documentation     Most Recent Value  Type of Advance Directive  Healthcare Power of Attorney, Living will  Pre-existing out of facility DNR order (yellow form or pink MOST form)  -  "MOST" Form in Place?  -      TOTAL TIME TAKING CARE OF THIS PATIENT: 45 minutes.    [name] Salary M.D on 01/24/2018 at 11:43 AM  Between 7am to 6pm - Pager - [phone]  After 6pm go to [email or web address] - password [email or web address] [facility]  Office  [phone]  CC: Primary care physician; Tyler Merles, Powell  Note: This dictation was prepared with Dragon dictation along with smaller phrase technology. Any transcriptional errors that result from this process are unintentional.

## 2018-01-24 NOTE — Progress Notes (Signed)
Speech Therapy Note: reviewed chart notes; consulted NSG then met w/ pt re: his status currently. Pt denied any difficulty swallowing and is currently on a regular diet; tolerates swallowing pills w/ water per NSG. He stated he felt "fine" w/ swallowing since his extubation. No further skilled ST services indicated as pt appears at his baseline. Pt agreed. NSG to reconsult if any change in status.    Orinda Kenner, [place], CCC-SLP

## 2018-01-25 LAB — CULTURE, BLOOD (ROUTINE X 2)
Culture: NO GROWTH
Culture: NO GROWTH
Special Requests: ADEQUATE
Special Requests: ADEQUATE

## 2018-01-27 ENCOUNTER — Inpatient Hospital Stay
Admission: EM | Admit: 2018-01-27 | Discharge: 2018-03-05 | DRG: 870 | Disposition: E | Payer: BLUE CROSS/BLUE SHIELD | Attending: Internal Medicine | Admitting: Internal Medicine

## 2018-01-27 DIAGNOSIS — Z9911 Dependence on respirator [ventilator] status: Secondary | ICD-10-CM

## 2018-01-27 DIAGNOSIS — J9601 Acute respiratory failure with hypoxia: Secondary | ICD-10-CM | POA: Diagnosis present

## 2018-01-27 DIAGNOSIS — E875 Hyperkalemia: Secondary | ICD-10-CM | POA: Diagnosis not present

## 2018-01-27 DIAGNOSIS — Z452 Encounter for adjustment and management of vascular access device: Secondary | ICD-10-CM

## 2018-01-27 DIAGNOSIS — I110 Hypertensive heart disease with heart failure: Secondary | ICD-10-CM | POA: Diagnosis present

## 2018-01-27 DIAGNOSIS — D649 Anemia, unspecified: Secondary | ICD-10-CM | POA: Diagnosis present

## 2018-01-27 DIAGNOSIS — J810 Acute pulmonary edema: Secondary | ICD-10-CM

## 2018-01-27 DIAGNOSIS — R633 Feeding difficulties: Secondary | ICD-10-CM | POA: Diagnosis present

## 2018-01-27 DIAGNOSIS — G4733 Obstructive sleep apnea (adult) (pediatric): Secondary | ICD-10-CM | POA: Diagnosis present

## 2018-01-27 DIAGNOSIS — I10 Essential (primary) hypertension: Secondary | ICD-10-CM | POA: Diagnosis present

## 2018-01-27 DIAGNOSIS — I469 Cardiac arrest, cause unspecified: Secondary | ICD-10-CM | POA: Diagnosis present

## 2018-01-27 DIAGNOSIS — A419 Sepsis, unspecified organism: Secondary | ICD-10-CM | POA: Diagnosis not present

## 2018-01-27 DIAGNOSIS — I248 Other forms of acute ischemic heart disease: Secondary | ICD-10-CM | POA: Diagnosis present

## 2018-01-27 DIAGNOSIS — R34 Anuria and oliguria: Secondary | ICD-10-CM | POA: Diagnosis present

## 2018-01-27 DIAGNOSIS — R627 Adult failure to thrive: Secondary | ICD-10-CM | POA: Diagnosis present

## 2018-01-27 DIAGNOSIS — I420 Dilated cardiomyopathy: Secondary | ICD-10-CM | POA: Diagnosis present

## 2018-01-27 DIAGNOSIS — I509 Heart failure, unspecified: Secondary | ICD-10-CM

## 2018-01-27 DIAGNOSIS — R1310 Dysphagia, unspecified: Secondary | ICD-10-CM | POA: Diagnosis present

## 2018-01-27 DIAGNOSIS — E1165 Type 2 diabetes mellitus with hyperglycemia: Secondary | ICD-10-CM | POA: Diagnosis present

## 2018-01-27 DIAGNOSIS — I255 Ischemic cardiomyopathy: Secondary | ICD-10-CM | POA: Diagnosis present

## 2018-01-27 DIAGNOSIS — N170 Acute kidney failure with tubular necrosis: Secondary | ICD-10-CM | POA: Diagnosis present

## 2018-01-27 DIAGNOSIS — R4020 Unspecified coma: Secondary | ICD-10-CM

## 2018-01-27 DIAGNOSIS — N179 Acute kidney failure, unspecified: Secondary | ICD-10-CM

## 2018-01-27 DIAGNOSIS — E119 Type 2 diabetes mellitus without complications: Secondary | ICD-10-CM

## 2018-01-27 DIAGNOSIS — Z7984 Long term (current) use of oral hypoglycemic drugs: Secondary | ICD-10-CM

## 2018-01-27 DIAGNOSIS — I5043 Acute on chronic combined systolic (congestive) and diastolic (congestive) heart failure: Secondary | ICD-10-CM | POA: Diagnosis present

## 2018-01-27 DIAGNOSIS — R6521 Severe sepsis with septic shock: Secondary | ICD-10-CM | POA: Diagnosis present

## 2018-01-27 DIAGNOSIS — Z515 Encounter for palliative care: Secondary | ICD-10-CM | POA: Diagnosis not present

## 2018-01-27 DIAGNOSIS — F17210 Nicotine dependence, cigarettes, uncomplicated: Secondary | ICD-10-CM | POA: Diagnosis present

## 2018-01-27 DIAGNOSIS — G931 Anoxic brain damage, not elsewhere classified: Secondary | ICD-10-CM | POA: Diagnosis present

## 2018-01-27 DIAGNOSIS — K5900 Constipation, unspecified: Secondary | ICD-10-CM | POA: Diagnosis present

## 2018-01-27 DIAGNOSIS — Z4659 Encounter for fitting and adjustment of other gastrointestinal appliance and device: Secondary | ICD-10-CM

## 2018-01-27 DIAGNOSIS — E874 Mixed disorder of acid-base balance: Secondary | ICD-10-CM | POA: Diagnosis present

## 2018-01-27 DIAGNOSIS — J9602 Acute respiratory failure with hypercapnia: Secondary | ICD-10-CM | POA: Diagnosis present

## 2018-01-27 DIAGNOSIS — Z833 Family history of diabetes mellitus: Secondary | ICD-10-CM

## 2018-01-27 DIAGNOSIS — Z7982 Long term (current) use of aspirin: Secondary | ICD-10-CM

## 2018-01-27 DIAGNOSIS — G253 Myoclonus: Secondary | ICD-10-CM | POA: Diagnosis present

## 2018-01-27 DIAGNOSIS — G936 Cerebral edema: Secondary | ICD-10-CM | POA: Diagnosis present

## 2018-01-27 DIAGNOSIS — Z66 Do not resuscitate: Secondary | ICD-10-CM | POA: Diagnosis not present

## 2018-01-28 ENCOUNTER — Emergency Department: Payer: BLUE CROSS/BLUE SHIELD

## 2018-01-28 ENCOUNTER — Inpatient Hospital Stay: Admit: 2018-01-28 | Payer: BLUE CROSS/BLUE SHIELD

## 2018-01-28 ENCOUNTER — Inpatient Hospital Stay
Admit: 2018-01-28 | Discharge: 2018-01-28 | Disposition: A | Discharge status: Home / Self Care | Payer: BLUE CROSS/BLUE SHIELD | Attending: Cardiology | Admitting: Cardiology

## 2018-01-28 ENCOUNTER — Inpatient Hospital Stay: Payer: BLUE CROSS/BLUE SHIELD

## 2018-01-28 DIAGNOSIS — Z7982 Long term (current) use of aspirin: Secondary | ICD-10-CM | POA: Diagnosis not present

## 2018-01-28 DIAGNOSIS — R6521 Severe sepsis with septic shock: Secondary | ICD-10-CM | POA: Diagnosis present

## 2018-01-28 DIAGNOSIS — F17210 Nicotine dependence, cigarettes, uncomplicated: Secondary | ICD-10-CM | POA: Diagnosis present

## 2018-01-28 DIAGNOSIS — E874 Mixed disorder of acid-base balance: Secondary | ICD-10-CM | POA: Diagnosis present

## 2018-01-28 DIAGNOSIS — G931 Anoxic brain damage, not elsewhere classified: Secondary | ICD-10-CM | POA: Diagnosis not present

## 2018-01-28 DIAGNOSIS — J9601 Acute respiratory failure with hypoxia: Secondary | ICD-10-CM | POA: Diagnosis present

## 2018-01-28 DIAGNOSIS — I469 Cardiac arrest, cause unspecified: Secondary | ICD-10-CM | POA: Diagnosis present

## 2018-01-28 DIAGNOSIS — I255 Ischemic cardiomyopathy: Secondary | ICD-10-CM | POA: Diagnosis present

## 2018-01-28 DIAGNOSIS — J810 Acute pulmonary edema: Secondary | ICD-10-CM | POA: Diagnosis present

## 2018-01-28 DIAGNOSIS — R633 Feeding difficulties: Secondary | ICD-10-CM | POA: Diagnosis present

## 2018-01-28 DIAGNOSIS — E119 Type 2 diabetes mellitus without complications: Secondary | ICD-10-CM

## 2018-01-28 DIAGNOSIS — E1165 Type 2 diabetes mellitus with hyperglycemia: Secondary | ICD-10-CM | POA: Diagnosis present

## 2018-01-28 DIAGNOSIS — A419 Sepsis, unspecified organism: Secondary | ICD-10-CM | POA: Diagnosis present

## 2018-01-28 DIAGNOSIS — G4733 Obstructive sleep apnea (adult) (pediatric): Secondary | ICD-10-CM | POA: Diagnosis present

## 2018-01-28 DIAGNOSIS — I420 Dilated cardiomyopathy: Secondary | ICD-10-CM | POA: Diagnosis present

## 2018-01-28 DIAGNOSIS — N170 Acute kidney failure with tubular necrosis: Secondary | ICD-10-CM | POA: Diagnosis present

## 2018-01-28 DIAGNOSIS — K5900 Constipation, unspecified: Secondary | ICD-10-CM | POA: Diagnosis present

## 2018-01-28 DIAGNOSIS — G936 Cerebral edema: Secondary | ICD-10-CM | POA: Diagnosis present

## 2018-01-28 DIAGNOSIS — Z833 Family history of diabetes mellitus: Secondary | ICD-10-CM | POA: Diagnosis not present

## 2018-01-28 DIAGNOSIS — Z452 Encounter for adjustment and management of vascular access device: Secondary | ICD-10-CM | POA: Diagnosis not present

## 2018-01-28 DIAGNOSIS — I5043 Acute on chronic combined systolic (congestive) and diastolic (congestive) heart failure: Secondary | ICD-10-CM | POA: Diagnosis present

## 2018-01-28 DIAGNOSIS — I110 Hypertensive heart disease with heart failure: Secondary | ICD-10-CM | POA: Diagnosis present

## 2018-01-28 DIAGNOSIS — R34 Anuria and oliguria: Secondary | ICD-10-CM | POA: Diagnosis present

## 2018-01-28 DIAGNOSIS — Z9911 Dependence on respirator [ventilator] status: Secondary | ICD-10-CM | POA: Diagnosis not present

## 2018-01-28 DIAGNOSIS — J9602 Acute respiratory failure with hypercapnia: Secondary | ICD-10-CM | POA: Diagnosis present

## 2018-01-28 DIAGNOSIS — I248 Other forms of acute ischemic heart disease: Secondary | ICD-10-CM | POA: Diagnosis present

## 2018-01-28 DIAGNOSIS — G253 Myoclonus: Secondary | ICD-10-CM | POA: Diagnosis present

## 2018-01-28 DIAGNOSIS — Z515 Encounter for palliative care: Secondary | ICD-10-CM | POA: Diagnosis not present

## 2018-01-28 DIAGNOSIS — I10 Essential (primary) hypertension: Secondary | ICD-10-CM | POA: Diagnosis present

## 2018-01-28 DIAGNOSIS — Z7189 Other specified counseling: Secondary | ICD-10-CM | POA: Diagnosis not present

## 2018-01-28 DIAGNOSIS — N179 Acute kidney failure, unspecified: Secondary | ICD-10-CM | POA: Diagnosis not present

## 2018-01-28 DIAGNOSIS — D649 Anemia, unspecified: Secondary | ICD-10-CM | POA: Diagnosis present

## 2018-01-28 LAB — LACTIC ACID, PLASMA
Lactic Acid, Venous: 8.7 mmol/L (ref 0.5–1.9)
Lactic Acid, Venous: 2.3 mmol/L (ref 0.5–1.9)

## 2018-01-28 LAB — BASIC METABOLIC PANEL
ANION GAP: 10 (ref 5–15)
Anion gap: 9 (ref 5–15)
Anion gap: 9 (ref 5–15)
Anion gap: 10 (ref 5–15)
Anion gap: 10 (ref 5–15)
Anion gap: 8 (ref 5–15)
BUN: 24 mg/dL — ABNORMAL HIGH (ref 6–20)
BUN: 26 mg/dL — AB (ref 6–20)
BUN: 26 mg/dL — ABNORMAL HIGH (ref 6–20)
BUN: 28 mg/dL — ABNORMAL HIGH (ref 6–20)
BUN: 29 mg/dL — AB (ref 6–20)
BUN: 33 mg/dL — AB (ref 6–20)
CALCIUM: 8.2 mg/dL — AB (ref 8.9–10.3)
CHLORIDE: 107 mmol/L (ref 98–111)
CHLORIDE: 108 mmol/L (ref 98–111)
CHLORIDE: 108 mmol/L (ref 98–111)
CHLORIDE: 110 mmol/L (ref 98–111)
CO2: 24 mmol/L (ref 22–32)
CO2: 23 mmol/L (ref 22–32)
CO2: 23 mmol/L (ref 22–32)
CO2: 21 mmol/L — AB (ref 22–32)
CO2: 21 mmol/L — AB (ref 22–32)
CO2: 21 mmol/L — AB (ref 22–32)
CREATININE: 2.11 mg/dL — AB (ref 0.61–1.24)
CREATININE: 2.33 mg/dL — AB (ref 0.61–1.24)
Calcium: 8.5 mg/dL — ABNORMAL LOW (ref 8.9–10.3)
Calcium: 8.6 mg/dL — ABNORMAL LOW (ref 8.9–10.3)
Calcium: 8.4 mg/dL — ABNORMAL LOW (ref 8.9–10.3)
Calcium: 8.2 mg/dL — ABNORMAL LOW (ref 8.9–10.3)
Calcium: 8.1 mg/dL — ABNORMAL LOW (ref 8.9–10.3)
Chloride: 109 mmol/L (ref 98–111)
Chloride: 108 mmol/L (ref 98–111)
Creatinine, Ser: 1.86 mg/dL — ABNORMAL HIGH (ref 0.61–1.24)
Creatinine, Ser: 1.89 mg/dL — ABNORMAL HIGH (ref 0.61–1.24)
Creatinine, Ser: 2.12 mg/dL — ABNORMAL HIGH (ref 0.61–1.24)
Creatinine, Ser: 2.04 mg/dL — ABNORMAL HIGH (ref 0.61–1.24)
GFR calc Af Amer: 45 mL/min — ABNORMAL LOW (ref >60)
GFR calc Af Amer: 44 mL/min — ABNORMAL LOW (ref >60)
GFR calc Af Amer: 38 mL/min — ABNORMAL LOW (ref >60)
GFR calc Af Amer: 34 mL/min — ABNORMAL LOW (ref >60)
GFR calc non Af Amer: 39 mL/min — ABNORMAL LOW (ref >60)
GFR calc non Af Amer: 38 mL/min — ABNORMAL LOW (ref >60)
GFR calc non Af Amer: 33 mL/min — ABNORMAL LOW (ref >60)
GFR calc non Af Amer: 33 mL/min — ABNORMAL LOW (ref >60)
GFR calc non Af Amer: 29 mL/min — ABNORMAL LOW (ref >60)
GFR, EST AFRICAN AMERICAN: 38 mL/min — AB (ref >60)
GFR, EST AFRICAN AMERICAN: 40 mL/min — AB (ref >60)
GFR, EST NON AFRICAN AMERICAN: 34 mL/min — AB (ref >60)
GLUCOSE: 262 mg/dL — AB (ref 70–99)
Glucose, Bld: 383 mg/dL — ABNORMAL HIGH (ref 70–99)
Glucose, Bld: 374 mg/dL — ABNORMAL HIGH (ref 70–99)
Glucose, Bld: 340 mg/dL — ABNORMAL HIGH (ref 70–99)
Glucose, Bld: 313 mg/dL — ABNORMAL HIGH (ref 70–99)
Glucose, Bld: 213 mg/dL — ABNORMAL HIGH (ref 70–99)
POTASSIUM: 4.5 mmol/L (ref 3.5–5.1)
POTASSIUM: 4.5 mmol/L (ref 3.5–5.1)
POTASSIUM: 4.4 mmol/L (ref 3.5–5.1)
Potassium: 3.9 mmol/L (ref 3.5–5.1)
Potassium: 3.7 mmol/L (ref 3.5–5.1)
Potassium: 3.4 mmol/L — ABNORMAL LOW (ref 3.5–5.1)
SODIUM: 140 mmol/L (ref 135–145)
SODIUM: 140 mmol/L (ref 135–145)
SODIUM: 141 mmol/L (ref 135–145)
Sodium: 140 mmol/L (ref 135–145)
Sodium: 139 mmol/L (ref 135–145)
Sodium: 139 mmol/L (ref 135–145)

## 2018-01-28 LAB — BLOOD GAS, ARTERIAL
Acid-base deficit: 1.8 mmol/L (ref 0.0–2.0)
Acid-base deficit: 13.6 mmol/L — ABNORMAL HIGH (ref 0.0–2.0)
Acid-base deficit: 3.9 mmol/L — ABNORMAL HIGH (ref 0.0–2.0)
BICARBONATE: 19.9 mmol/L — AB (ref 20.0–28.0)
Bicarbonate: 26.6 mmol/L (ref 20.0–28.0)
Bicarbonate: 23.4 mmol/L (ref 20.0–28.0)
FIO2: 1
FIO2: 100
FIO2: 100
MECHVT: 500 mL
MECHVT: 500 mL
MECHVT: 500 mL
Mechanical Rate: 20
Mechanical Rate: 25
O2 Saturation: 92.6 %
O2 Saturation: 93 %
O2 Saturation: 95.2 %
PEEP/CPAP: 5 cmH2O
PEEP: 10 cmH2O
PEEP: 10 cmH2O
Patient temperature: 36.5
Patient temperature: 37
Patient temperature: 38.5
RATE: 20 resp/min
pCO2 arterial: 51 mmHg — ABNORMAL HIGH (ref 32.0–48.0)
pCO2 arterial: 62 mmHg — ABNORMAL HIGH (ref 32.0–48.0)
pCO2 arterial: 97 mmHg (ref 32.0–48.0)
pH, Arterial: 6.92 — CL (ref 7.350–7.450)
pH, Arterial: 7.22 — ABNORMAL LOW (ref 7.350–7.450)
pH, Arterial: 7.28 — ABNORMAL LOW (ref 7.350–7.450)
pO2, Arterial: 106 mmHg (ref 83.0–108.0)
pO2, Arterial: 84 mmHg (ref 83.0–108.0)
pO2, Arterial: 85 mmHg (ref 83.0–108.0)

## 2018-01-28 LAB — TROPONIN I
TROPONIN I: 0.07 ng/mL — AB (ref <0.03)
Troponin I: 0.5 ng/mL
Troponin I: 0.9 ng/mL
Troponin I: 1.17 ng/mL (ref <0.03)
Troponin I: 1.36 ng/mL (ref <0.03)

## 2018-01-28 LAB — APTT
APTT: 46 s — AB (ref 24–36)
aPTT: 40 seconds — ABNORMAL HIGH (ref 24–36)

## 2018-01-28 LAB — COMPREHENSIVE METABOLIC PANEL
ALBUMIN: 2.6 g/dL — AB (ref 3.5–5.0)
ALT: 31 U/L (ref 0–44)
ALT: 36 U/L (ref 0–44)
ANION GAP: 14 (ref 5–15)
ANION GAP: 9 (ref 5–15)
AST: 58 U/L — ABNORMAL HIGH (ref 15–41)
AST: 59 U/L — ABNORMAL HIGH (ref 15–41)
Albumin: 2.2 g/dL — ABNORMAL LOW (ref 3.5–5.0)
Alkaline Phosphatase: 49 U/L (ref 38–126)
Alkaline Phosphatase: 54 U/L (ref 38–126)
BILIRUBIN TOTAL: 1 mg/dL (ref 0.3–1.2)
BUN: 14 mg/dL (ref 6–20)
BUN: 23 mg/dL — ABNORMAL HIGH (ref 6–20)
CALCIUM: 9.1 mg/dL (ref 8.9–10.3)
CHLORIDE: 107 mmol/L (ref 98–111)
CO2: 23 mmol/L (ref 22–32)
CO2: 27 mmol/L (ref 22–32)
Calcium: 9.8 mg/dL (ref 8.9–10.3)
Chloride: 105 mmol/L (ref 98–111)
Creatinine, Ser: 1.47 mg/dL — ABNORMAL HIGH (ref 0.61–1.24)
Creatinine, Ser: 1.77 mg/dL — ABNORMAL HIGH (ref 0.61–1.24)
GFR, EST AFRICAN AMERICAN: 47 mL/min — AB (ref >60)
GFR, EST AFRICAN AMERICAN: 59 mL/min — AB (ref >60)
GFR, EST NON AFRICAN AMERICAN: 41 mL/min — AB (ref >60)
GFR, EST NON AFRICAN AMERICAN: 51 mL/min — AB (ref >60)
Glucose, Bld: 317 mg/dL — ABNORMAL HIGH (ref 70–99)
Glucose, Bld: 333 mg/dL — ABNORMAL HIGH (ref 70–99)
POTASSIUM: 3.7 mmol/L (ref 3.5–5.1)
Potassium: 4.7 mmol/L (ref 3.5–5.1)
Sodium: 144 mmol/L (ref 135–145)
Sodium: 141 mmol/L (ref 135–145)
TOTAL PROTEIN: 6.7 g/dL (ref 6.5–8.1)
Total Bilirubin: 0.5 mg/dL (ref 0.3–1.2)
Total Protein: 5.8 g/dL — ABNORMAL LOW (ref 6.5–8.1)

## 2018-01-28 LAB — GLUCOSE, CAPILLARY
GLUCOSE-CAPILLARY: 354 mg/dL — AB (ref 70–99)
GLUCOSE-CAPILLARY: 319 mg/dL — AB (ref 70–99)
GLUCOSE-CAPILLARY: 256 mg/dL — AB (ref 70–99)
GLUCOSE-CAPILLARY: 228 mg/dL — AB (ref 70–99)
GLUCOSE-CAPILLARY: 202 mg/dL — AB (ref 70–99)
Glucose-Capillary: 205 mg/dL — ABNORMAL HIGH (ref 70–99)
Glucose-Capillary: 210 mg/dL — ABNORMAL HIGH (ref 70–99)
Glucose-Capillary: 220 mg/dL — ABNORMAL HIGH (ref 70–99)
Glucose-Capillary: 255 mg/dL — ABNORMAL HIGH (ref 70–99)
Glucose-Capillary: 263 mg/dL — ABNORMAL HIGH (ref 70–99)
Glucose-Capillary: 265 mg/dL — ABNORMAL HIGH (ref 70–99)
Glucose-Capillary: 286 mg/dL — ABNORMAL HIGH (ref 70–99)
Glucose-Capillary: 318 mg/dL — ABNORMAL HIGH (ref 70–99)
Glucose-Capillary: 340 mg/dL — ABNORMAL HIGH (ref 70–99)
Glucose-Capillary: 343 mg/dL — ABNORMAL HIGH (ref 70–99)
Glucose-Capillary: 347 mg/dL — ABNORMAL HIGH (ref 70–99)
Glucose-Capillary: 355 mg/dL — ABNORMAL HIGH (ref 70–99)

## 2018-01-28 LAB — PROCALCITONIN: Procalcitonin: 10.24 ng/mL

## 2018-01-28 LAB — CBC
HCT: 30.9 % — ABNORMAL LOW (ref 40.0–52.0)
HCT: 34.5 % — ABNORMAL LOW (ref 40.0–52.0)
Hemoglobin: 10.3 g/dL — ABNORMAL LOW (ref 13.0–18.0)
Hemoglobin: 11.9 g/dL — ABNORMAL LOW (ref 13.0–18.0)
MCH: 32 pg (ref 26.0–34.0)
MCH: 32.2 pg (ref 26.0–34.0)
MCHC: 33.3 g/dL (ref 32.0–36.0)
MCHC: 34.4 g/dL (ref 32.0–36.0)
MCV: 93.5 fL (ref 80.0–100.0)
MCV: 96.2 fL (ref 80.0–100.0)
PLATELETS: 288 10*3/uL (ref 150–440)
Platelets: 268 10*3/uL (ref 150–440)
RBC: 3.22 MIL/uL — ABNORMAL LOW (ref 4.40–5.90)
RBC: 3.69 MIL/uL — ABNORMAL LOW (ref 4.40–5.90)
RDW: 13.1 % (ref 11.5–14.5)
RDW: 13.1 % (ref 11.5–14.5)
WBC: 22.5 10*3/uL — ABNORMAL HIGH (ref 3.8–10.6)
WBC: 33.8 10*3/uL — ABNORMAL HIGH (ref 3.8–10.6)

## 2018-01-28 LAB — BRAIN NATRIURETIC PEPTIDE: B Natriuretic Peptide: 787 pg/mL — ABNORMAL HIGH (ref 0.0–100.0)

## 2018-01-28 LAB — PROTIME-INR
INR: 1.29
INR: 1.36
Prothrombin Time: 16 s — ABNORMAL HIGH (ref 11.4–15.2)
Prothrombin Time: 16.7 s — ABNORMAL HIGH (ref 11.4–15.2)

## 2018-01-28 LAB — PHOSPHORUS: Phosphorus: 8.2 mg/dL — ABNORMAL HIGH (ref 2.5–4.6)

## 2018-01-28 LAB — MRSA PCR SCREENING: MRSA by PCR: NEGATIVE

## 2018-01-28 LAB — MAGNESIUM
MAGNESIUM: 2.1 mg/dL (ref 1.7–2.4)
Magnesium: 2.2 mg/dL (ref 1.7–2.4)

## 2018-01-28 LAB — TRIGLYCERIDES: TRIGLYCERIDES: 222 mg/dL — AB (ref <150)

## 2018-01-28 MED ORDER — EPINEPHRINE PF 1 MG/10ML IJ SOSY
PREFILLED_SYRINGE | INTRAMUSCULAR | Status: AC | PRN
Start: 2018-01-28 — End: 2018-01-28
  Administered 2018-01-27 – 2018-01-28 (×3): 1 via INTRAVENOUS

## 2018-01-28 MED ORDER — FAMOTIDINE IN NACL 20-0.9 MG/50ML-% IV SOLN
20.0000 mg | Freq: Two times a day (BID) | INTRAVENOUS | Status: DC
  Administered 2018-01-28 – 2018-01-30 (×6): 20 mg via INTRAVENOUS
  Filled 2018-01-28 (×6): qty 50

## 2018-01-28 MED ORDER — CISATRACURIUM BOLUS VIA INFUSION
0.0500 mg/kg | INTRAVENOUS | Status: DC | PRN
  Filled 2018-01-28: qty 6

## 2018-01-28 MED ORDER — CALCIUM CHLORIDE 10 % IV SOLN
INTRAVENOUS | Status: AC | PRN
  Administered 2018-01-27 – 2018-01-28 (×2): 1 g via INTRAVENOUS

## 2018-01-28 MED ORDER — FENTANYL 2500MCG IN NS 250ML (10MCG/ML) PREMIX INFUSION
100.0000 ug/h | INTRAVENOUS | Status: DC
  Administered 2018-01-28: 100 ug/h via INTRAVENOUS
  Administered 2018-01-28 – 2018-01-29 (×2): 150 ug/h via INTRAVENOUS
  Administered 2018-01-29 – 2018-01-30 (×3): 200 ug/h via INTRAVENOUS
  Filled 2018-01-28 (×5): qty 250

## 2018-01-28 MED ORDER — ACETAMINOPHEN 650 MG RE SUPP
650.0000 mg | Freq: Four times a day (QID) | RECTAL | Status: DC | PRN
  Administered 2018-01-28: 650 mg via RECTAL
  Filled 2018-01-28: qty 1

## 2018-01-28 MED ORDER — ACETAMINOPHEN 325 MG PO TABS
650.0000 mg | ORAL_TABLET | Freq: Four times a day (QID) | ORAL | Status: DC | PRN
  Administered 2018-02-08 – 2018-02-09 (×2): 650 mg via ORAL
  Filled 2018-01-28 (×2): qty 2

## 2018-01-28 MED ORDER — ORAL CARE MOUTH RINSE
15.0000 mL | OROMUCOSAL | Status: DC
  Administered 2018-01-28 – 2018-02-09 (×120): 15 mL via OROMUCOSAL

## 2018-01-28 MED ORDER — SODIUM CHLORIDE 0.9 % IV SOLN
INTRAVENOUS | Status: DC
  Administered 2018-01-28 – 2018-02-02 (×2): via INTRAVENOUS

## 2018-01-28 MED ORDER — SODIUM CHLORIDE 0.9% FLUSH
10.0000 mL | Freq: Two times a day (BID) | INTRAVENOUS | Status: DC
  Administered 2018-01-28 (×2): 10 mL
  Administered 2018-01-29: 40 mL
  Administered 2018-01-29 – 2018-01-30 (×2): 10 mL
  Administered 2018-01-30: 20 mL
  Administered 2018-01-31: 30 mL
  Administered 2018-01-31: 10 mL
  Administered 2018-02-01: 30 mL
  Administered 2018-02-01 – 2018-02-07 (×10): 10 mL
  Administered 2018-02-08: 20 mL
  Administered 2018-02-08: 10 mL

## 2018-01-28 MED ORDER — VASOPRESSIN 20 UNIT/ML IV SOLN
0.0300 [IU]/min | INTRAVENOUS | Status: DC
  Administered 2018-01-28 – 2018-01-30 (×3): 0.03 [IU]/min via INTRAVENOUS
  Filled 2018-01-28 (×3): qty 2

## 2018-01-28 MED ORDER — NITROGLYCERIN IN D5W 200-5 MCG/ML-% IV SOLN
INTRAVENOUS | Status: AC
  Filled 2018-01-28: qty 250

## 2018-01-28 MED ORDER — MAGNESIUM SULFATE 50 % IJ SOLN
INTRAMUSCULAR | Status: AC | PRN
  Administered 2018-01-27: 2 g via INTRAVENOUS

## 2018-01-28 MED ORDER — PROPOFOL 1000 MG/100ML IV EMUL
25.0000 ug/kg/min | INTRAVENOUS | Status: DC
  Administered 2018-01-28: 30 ug/kg/min via INTRAVENOUS
  Administered 2018-01-28: 20 ug/kg/min via INTRAVENOUS
  Administered 2018-01-28: 80 ug/kg/min via INTRAVENOUS
  Administered 2018-01-28: 50 ug/kg/min via INTRAVENOUS
  Administered 2018-01-29: 30 ug/kg/min via INTRAVENOUS
  Administered 2018-01-29: 40 ug/kg/min via INTRAVENOUS
  Administered 2018-01-29 (×2): 45 ug/kg/min via INTRAVENOUS
  Administered 2018-01-30: 40 ug/kg/min via INTRAVENOUS
  Filled 2018-01-28 (×11): qty 100

## 2018-01-28 MED ORDER — IPRATROPIUM-ALBUTEROL 0.5-2.5 (3) MG/3ML IN SOLN
3.0000 mL | Freq: Four times a day (QID) | RESPIRATORY_TRACT | Status: DC
  Administered 2018-01-28 – 2018-02-09 (×49): 3 mL via RESPIRATORY_TRACT
  Filled 2018-01-28 (×48): qty 3

## 2018-01-28 MED ORDER — INSULIN REGULAR BOLUS VIA INFUSION
0.0000 [IU] | Freq: Three times a day (TID) | INTRAVENOUS | Status: DC
  Filled 2018-01-28: qty 10

## 2018-01-28 MED ORDER — ARTIFICIAL TEARS OPHTHALMIC OINT
1.0000 "application " | TOPICAL_OINTMENT | Freq: Three times a day (TID) | OPHTHALMIC | Status: DC
  Administered 2018-01-28 – 2018-01-31 (×10): 1 via OPHTHALMIC
  Filled 2018-01-28 (×2): qty 1

## 2018-01-28 MED ORDER — CHLORHEXIDINE GLUCONATE 0.12% ORAL RINSE (MEDLINE KIT)
15.0000 mL | Freq: Two times a day (BID) | OROMUCOSAL | Status: DC
  Administered 2018-01-28 – 2018-02-09 (×25): 15 mL via OROMUCOSAL

## 2018-01-28 MED ORDER — SODIUM CHLORIDE 0.9 % IV SOLN
1.0000 ug/kg/min | INTRAVENOUS | Status: DC
  Filled 2018-01-28: qty 20

## 2018-01-28 MED ORDER — ENOXAPARIN SODIUM 40 MG/0.4ML ~~LOC~~ SOLN
40.0000 mg | SUBCUTANEOUS | Status: DC
  Administered 2018-01-28 – 2018-01-29 (×2): 40 mg via SUBCUTANEOUS
  Filled 2018-01-28 (×2): qty 0.4

## 2018-01-28 MED ORDER — SODIUM CHLORIDE 0.9 % IV BOLUS
1000.0000 mL | Freq: Once | INTRAVENOUS | Status: AC
Start: 2018-01-28 — End: 2018-01-28
  Administered 2018-01-28: 1000 mL via INTRAVENOUS

## 2018-01-28 MED ORDER — NOREPINEPHRINE 4 MG/250ML-% IV SOLN
0.0000 ug/min | INTRAVENOUS | Status: DC
  Administered 2018-01-28: 25 ug/min via INTRAVENOUS
  Administered 2018-01-28: 20 ug/min via INTRAVENOUS
  Administered 2018-01-28: 5 ug/min via INTRAVENOUS
  Administered 2018-01-29: 7 ug/min via INTRAVENOUS
  Administered 2018-01-29 (×2): 25 ug/min via INTRAVENOUS
  Administered 2018-01-29: 10 ug/min via INTRAVENOUS
  Administered 2018-01-30: 35 ug/min via INTRAVENOUS
  Filled 2018-01-28 (×9): qty 250

## 2018-01-28 MED ORDER — SODIUM CHLORIDE 0.9 % IV SOLN
INTRAVENOUS | Status: DC
  Administered 2018-01-28: 21 [IU]/h via INTRAVENOUS
  Administered 2018-01-28: 2.9 [IU]/h via INTRAVENOUS
  Administered 2018-01-28: 18.1 [IU]/h via INTRAVENOUS
  Administered 2018-01-29: 15.8 [IU]/h via INTRAVENOUS
  Filled 2018-01-28 (×4): qty 1

## 2018-01-28 MED ORDER — FUROSEMIDE 10 MG/ML IJ SOLN
40.0000 mg | Freq: Once | INTRAMUSCULAR | Status: AC
  Administered 2018-01-28: 40 mg via INTRAVENOUS
  Filled 2018-01-28: qty 4

## 2018-01-28 MED ORDER — ASPIRIN 81 MG PO CHEW: 324.0000 mg | CHEWABLE_TABLET | ORAL | Status: AC

## 2018-01-28 MED ORDER — FENTANYL CITRATE (PF) 100 MCG/2ML IJ SOLN
100.0000 ug | Freq: Once | INTRAMUSCULAR | Status: AC
  Administered 2018-01-28: 100 ug via INTRAVENOUS

## 2018-01-28 MED ORDER — CISATRACURIUM BOLUS VIA INFUSION
0.1000 mg/kg | Freq: Once | INTRAVENOUS | Status: DC
  Filled 2018-01-28: qty 11

## 2018-01-28 MED ORDER — MIDAZOLAM HCL 2 MG/2ML IJ SOLN
4.0000 mg | Freq: Once | INTRAMUSCULAR | Status: AC
  Administered 2018-01-28: 4 mg via INTRAVENOUS

## 2018-01-28 MED ORDER — ASPIRIN 300 MG RE SUPP
300.0000 mg | RECTAL | Status: AC
  Administered 2018-01-28: 300 mg via RECTAL
  Filled 2018-01-28: qty 1

## 2018-01-28 MED ORDER — PROPOFOL 1000 MG/100ML IV EMUL
5.0000 ug/kg/min | INTRAVENOUS | Status: DC
  Administered 2018-01-28: 50 ug/kg/min via INTRAVENOUS
  Filled 2018-01-28: qty 100

## 2018-01-28 MED ORDER — LORAZEPAM 2 MG/ML IJ SOLN
INTRAMUSCULAR | Status: AC
  Administered 2018-01-28: 01:00:00
  Filled 2018-01-28: qty 1

## 2018-01-28 MED ORDER — NITROGLYCERIN IN D5W 200-5 MCG/ML-% IV SOLN
0.0000 ug/min | Freq: Once | INTRAVENOUS | Status: AC
  Administered 2018-01-28: 5 ug/min via INTRAVENOUS

## 2018-01-28 MED ORDER — SODIUM CHLORIDE 0.9 % IV SOLN
2.0000 g | Freq: Two times a day (BID) | INTRAVENOUS | Status: DC
  Administered 2018-01-28 – 2018-02-02 (×11): 2 g via INTRAVENOUS
  Filled 2018-01-28 (×12): qty 2

## 2018-01-28 MED ORDER — FENTANYL BOLUS VIA INFUSION
50.0000 ug | INTRAVENOUS | Status: DC | PRN
  Administered 2018-01-30: 50 ug via INTRAVENOUS
  Filled 2018-01-28: qty 50

## 2018-01-28 MED ORDER — CHLORHEXIDINE GLUCONATE 0.12% ORAL RINSE (MEDLINE KIT): 15.0000 mL | Freq: Two times a day (BID) | OROMUCOSAL | Status: DC

## 2018-01-28 MED ORDER — ALTEPLASE (PULMONARY EMBOLISM) INFUSION
50.0000 mg | Freq: Once | INTRAVENOUS | Status: AC
  Administered 2018-01-28: 50 mg via INTRAVENOUS

## 2018-01-28 MED ORDER — SODIUM CHLORIDE 0.9 % IV SOLN
250.0000 mL | INTRAVENOUS | Status: DC | PRN
  Administered 2018-02-02: 09:00:00 via INTRAVENOUS

## 2018-01-28 MED ORDER — LEVETIRACETAM IN NACL 1000 MG/100ML IV SOLN
1000.0000 mg | Freq: Once | INTRAVENOUS | Status: AC
  Administered 2018-01-28: 1000 mg via INTRAVENOUS
  Filled 2018-01-28: qty 100

## 2018-01-28 MED ORDER — ATROPINE SULFATE 1 MG/ML IJ SOLN
INTRAMUSCULAR | Status: AC | PRN
  Administered 2018-01-27: 1 mg via INTRAVENOUS

## 2018-01-28 MED ORDER — SODIUM CHLORIDE 0.9 % IV SOLN
0.5000 mg/h | INTRAVENOUS | Status: DC
  Administered 2018-01-28: 0.5 mg/h via INTRAVENOUS
  Filled 2018-01-28 (×2): qty 10

## 2018-01-28 MED ORDER — VANCOMYCIN HCL 10 G IV SOLR
1250.0000 mg | Freq: Once | INTRAVENOUS | Status: AC
  Administered 2018-01-28: 1250 mg via INTRAVENOUS
  Filled 2018-01-28: qty 1250

## 2018-01-28 MED ORDER — SODIUM BICARBONATE 8.4 % IV SOLN
INTRAVENOUS | Status: AC | PRN
  Administered 2018-01-27 – 2018-01-28 (×2): 100 meq via INTRAVENOUS

## 2018-01-28 MED ORDER — VANCOMYCIN HCL 10 G IV SOLR
1250.0000 mg | INTRAVENOUS | Status: DC
  Administered 2018-01-28 – 2018-01-29 (×2): 1250 mg via INTRAVENOUS
  Filled 2018-01-28 (×3): qty 1250

## 2018-01-28 MED ORDER — SODIUM CHLORIDE 0.9 % IV SOLN: INTRAVENOUS | Status: DC

## 2018-01-28 MED ORDER — DEXTROSE-NACL 5-0.45 % IV SOLN: INTRAVENOUS | Status: DC

## 2018-01-28 MED ORDER — SODIUM CHLORIDE 0.9% FLUSH: 10.0000 mL | INTRAVENOUS | Status: DC | PRN

## 2018-01-28 MED ORDER — DEXTROSE 50 % IV SOLN: 25.0000 mL | INTRAVENOUS | Status: DC | PRN

## 2018-01-28 NOTE — Progress Notes (Signed)
Ms. Tyler Mane, NP began CVC 3 L procedure, VSS, will monitor and assist as needed

## 2018-01-28 NOTE — Progress Notes (Signed)
abg results shown to [name] Mane  NP order to increase rate 25 , changes are made on vent pt tolerating well sat 94%

## 2018-01-28 NOTE — Progress Notes (Signed)
PT transported to CT and back to ED without incident.

## 2018-01-28 NOTE — ED Provider Notes (Signed)
[facility] Emergency Department Provider Note   ____________________________________________   First MD Initiated Contact with Patient 01/25/18 2353     (approximate)  I have reviewed the triage vital signs and the nursing notes.   HISTORY  Chief Complaint Cardiac Arrest  The patient was unresponsive and then cardiac arrest and could not answer questions.  HPI [name] is a 56 y.o. male who was brought into the hospital today by family with some shortness of breath.  When the patient arrived to the emergency department he was unresponsive.  Staff was called to the front entrance of the hospital where the patient was pulled out of the car unresponsive and foaming at the mouth onto a stretcher.  He was pulseless on arrival so we did start CPR on the patient.  The patient had no response, no agonal breathing, no pupillary response.  [name] states that he had been discharged from the hospital approximately 2 days prior.  The patient had been admitted with heart failure and shortness of breath.  She reports that he started back coughing again and then suddenly complained of shortness of breath which is when they brought him to the hospital.   Past Medical History:  Diagnosis Date  . Chest pain 2019  . Diabetes mellitus without complication ([place])   . Hypertension   . Sleep apnea     Patient Active Problem List   Diagnosis Date Noted  . Cardiopulmonary arrest with successful resuscitation ([place]) 01/28/2018  . Acute on chronic combined systolic and diastolic CHF (congestive heart failure) ([place]) 01/28/2018  . Flash pulmonary edema ([place]) 01/28/2018  . Accelerated hypertension 01/28/2018  . Diabetes ([place]) 01/28/2018  . Acute hypoxemic respiratory failure ([place]) 01/20/2018  . Lipoma of back 11/08/2017    Past Surgical History:  Procedure Laterality Date  . COLONOSCOPY  2017  . LIPOMA EXCISION     Left forehead    Prior to Admission  medications   Medication Sig Start Date End Date Taking? Authorizing Provider  ALPRAZolam Duanne Moron) 0.5 MG tablet Take 0.5 mg by mouth 2 (two) times daily as needed for anxiety. 01/26/18  Yes [provider]  furosemide (LASIX) 20 MG tablet Take 1 tablet (20 mg total) by mouth daily. 01/25/18  Yes Salary, [name], MD  hydrALAZINE (APRESOLINE) 25 MG tablet Take 1 tablet (25 mg total) by mouth every 8 (eight) hours. 01/24/18  Yes Salary, [name], MD  metoprolol tartrate (LOPRESSOR) 50 MG tablet Take 1 tablet (50 mg total) by mouth 2 (two) times daily. 01/24/18  Yes Salary, [name], MD  moxifloxacin (AVELOX) 400 MG tablet Take 1 tablet (400 mg total) by mouth daily for 7 days. 01/24/18 01/31/18 Yes Salary, Montell D, MD  aspirin EC 81 MG tablet Take 81 mg by mouth daily.    [provider]  HYDROcodone-acetaminophen (NORCO/VICODIN) 5-325 MG tablet Take 1 tablet by mouth every 4 (four) hours as needed. Patient not taking: Reported on 01/20/2018 12/13/17   Harvest Dark, MD  losartan (COZAAR) 25 MG tablet Take 25 mg by mouth daily. 08/26/16   [provider]  metFORMIN (GLUCOPHAGE-XR) 500 MG 24 hr tablet Take 2,000 mg by mouth daily.     [provider]  polyethylene glycol powder (GLYCOLAX/MIRALAX) powder Take 17 g by mouth daily as needed for moderate constipation. Patient not taking: Reported on 01/20/2018 12/13/17   Harvest Dark, MD  saxagliptin HCl (ONGLYZA) 5 MG TABS tablet Take 5 mg by mouth  daily.     [provider]    Allergies Enalapril maleate; Iodinated diagnostic agents; Isosorbide mononitrate er [isosorbide dinitrate]; and Canagliflozin  Family History  Problem Relation Age of Onset  . Diabetes Father   . Colon cancer Neg Hx     Social History Social History   Tobacco Use  . Smoking status: Current Every Day Smoker    Packs/day: 0.30    Years: 32.00    Pack years: 9.60    Types: Cigarettes  . Smokeless tobacco: Never Used    Substance Use Topics  . Alcohol use: No  . Drug use: Never    Review of Systems Unable to assess due to patient unresponsiveness   ____________________________________________   PHYSICAL EXAM:  VITAL SIGNS: ED Triage Vitals  Enc Vitals Group     BP 01/28/18 0018 (!) 151/80     Pulse Rate 01/28/18 0015 (!) 114     Resp 01/28/18 0015 18     Temp --      Temp src --      SpO2 01/28/18 0015 97 %     Weight --      Height --      Head Circumference --      Peak Flow --      Pain Score --      Pain Loc --      Pain Edu? --      Excl. in [place]? --     Constitutional: The patient is unresponsive and cool to the touch Eyes: Pupils are fixed at approximately 4 mm, no pupillary reflex Head: Atraumatic. Nose: No congestion/rhinnorhea. Mouth/Throat: Mucous membranes are moist.  Patient with pink foamy sputum coming from his mouth Cardiovascular: No heartbeat auscultated patient has no pulse to palpation Respiratory: Patient with no respiratory effort and he was bagged on arrival. Gastrointestinal: Soft with some mild distension Musculoskeletal: No lower extremity tenderness nor edema.   Neurologic:  unresponsive  Skin:  Skin is cool to touch  Psychiatric: unresponsive  ____________________________________________   LABS (all labs ordered are listed, but only abnormal results are displayed)  Labs Reviewed  CBC - Abnormal; Notable for the following components:      Result Value   WBC 22.5 (*)    RBC 3.22 (*)    Hemoglobin 10.3 (*)    HCT 30.9 (*)    All other components within normal limits  COMPREHENSIVE METABOLIC PANEL - Abnormal; Notable for the following components:   Glucose, Bld 317 (*)    Creatinine, Ser 1.47 (*)    Total Protein 5.8 (*)    Albumin 2.2 (*)    AST 58 (*)    GFR calc non Af Amer 51 (*)    GFR calc Af Amer 59 (*)    All other components within normal limits  TROPONIN I - Abnormal; Notable for the following components:   Troponin I 0.07 (*)     All other components within normal limits  LACTIC ACID, PLASMA - Abnormal; Notable for the following components:   Lactic Acid, Venous 8.7 (*)    All other components within normal limits  LACTIC ACID, PLASMA - Abnormal; Notable for the following components:   Lactic Acid, Venous 2.3 (*)    All other components within normal limits  BLOOD GAS, ARTERIAL - Abnormal; Notable for the following components:   pH, Arterial 6.92 (*)    pCO2 arterial 97 (*)    Bicarbonate 19.9 (*)    Acid-base deficit 13.6 (*)  All other components within normal limits  TRIGLYCERIDES - Abnormal; Notable for the following components:   Triglycerides 222 (*)    All other components within normal limits  GLUCOSE, CAPILLARY - Abnormal; Notable for the following components:   Glucose-Capillary 265 (*)    All other components within normal limits  BRAIN NATRIURETIC PEPTIDE - Abnormal; Notable for the following components:   B Natriuretic Peptide 787.0 (*)    All other components within normal limits  TROPONIN I - Abnormal; Notable for the following components:   Troponin I 0.50 (*)    All other components within normal limits  PROTIME-INR - Abnormal; Notable for the following components:   Prothrombin Time 16.0 (*)    All other components within normal limits  APTT - Abnormal; Notable for the following components:   aPTT 46 (*)    All other components within normal limits  BLOOD GAS, ARTERIAL - Abnormal; Notable for the following components:   pH, Arterial 7.22 (*)    pCO2 arterial 62 (*)    All other components within normal limits  BLOOD GAS, ARTERIAL - Abnormal; Notable for the following components:   pH, Arterial 7.28 (*)    pCO2 arterial 51 (*)    Acid-base deficit 3.9 (*)    All other components within normal limits  PHOSPHORUS - Abnormal; Notable for the following components:   Phosphorus 8.2 (*)    All other components within normal limits  CBC - Abnormal; Notable for the following components:     WBC 33.8 (*)    RBC 3.69 (*)    Hemoglobin 11.9 (*)    HCT 34.5 (*)    All other components within normal limits  COMPREHENSIVE METABOLIC PANEL - Abnormal; Notable for the following components:   Glucose, Bld 333 (*)    BUN 23 (*)    Creatinine, Ser 1.77 (*)    Albumin 2.6 (*)    AST 59 (*)    GFR calc non Af Amer 41 (*)    GFR calc Af Amer 47 (*)    All other components within normal limits  GLUCOSE, CAPILLARY - Abnormal; Notable for the following components:   Glucose-Capillary 354 (*)    All other components within normal limits  GLUCOSE, CAPILLARY - Abnormal; Notable for the following components:   Glucose-Capillary 355 (*)    All other components within normal limits  GLUCOSE, CAPILLARY - Abnormal; Notable for the following components:   Glucose-Capillary 347 (*)    All other components within normal limits  MRSA PCR SCREENING  CULTURE, RESPIRATORY  CULTURE, BLOOD (ROUTINE X 2)  CULTURE, BLOOD (ROUTINE X 2)  URINE CULTURE  MAGNESIUM  TROPONIN I  TROPONIN I  TROPONIN I  PROTIME-INR  APTT  PROCALCITONIN   ____________________________________________  EKG  ED ECG REPORT #1 I, Loney Hering, the attending physician, personally viewed and interpreted this ECG.   Date: 01/28/2018  EKG Time: 0011  Rate: 136  Rhythm: sinus tachycardia  Axis: normal  Intervals:none  ST&T Change: no STEMI  ED ECG REPORT #2 I, Loney Hering, the attending physician, personally viewed and interpreted this ECG.   Date: 01/28/2018  EKG Time: 0014  Rate: 118  Rhythm: sinus tachycardia  Axis: normal  Intervals:none  ST&T Change: ST depression in lead V6  ____________________________________________  RADIOLOGY  ED MD interpretation:  CXR: Endotracheal tube tip about 3.5 cm superior to the carina, cardiomegaly, interim worsening of bilateral interstitial and alveolar airspace disease.  CT  head: Early suspected hypoxic ischemic encephalopathy/anoxic brain injury.   Mild chronic small vessel ischemic changes, mild atherosclerosis.  Official radiology report(s): Dg Abd 1 View  Result Date: 01/28/2018 CLINICAL DATA:  NG tube placement EXAM: ABDOMEN - 1 VIEW COMPARISON:  01/20/2018 FINDINGS: No enteric tube is demonstrated within the field of view. Mild gaseous distention of the stomach. IMPRESSION: No enteric tube visualized. Electronically Signed   By: Lucienne Capers M.D.   On: 01/28/2018 05:53   Ct Head Wo Contrast  Result Date: 01/28/2018 CLINICAL DATA:  Cardiac arrest. History of hypertension and diabetes. EXAM: CT HEAD WITHOUT CONTRAST TECHNIQUE: Contiguous axial images were obtained from the base of the skull through the vertex without intravenous contrast. COMPARISON:  CT neck January 20, 2018. FINDINGS: BRAIN: No intraparenchymal hemorrhage, mass effect nor midline shift. Mild blurring of the gray-white matter differentiation. Patchy supratentorial white matter hypodensities. The ventricles and sulci are normal. No acute large vascular territory infarcts. No abnormal extra-axial fluid collections. Basal cisterns are patent. VASCULAR: Mild calcific atherosclerosis carotid bifurcations. SKULL/SOFT TISSUES: No skull fracture. No significant soft tissue swelling. ORBITS/SINUSES: The included ocular globes and orbital contents are normal.Mild paranasal sinus mucosal thickening without air-fluid levels. Included mastoid air cells are well aerated. OTHER: Life-support lines in place. IMPRESSION: 1. 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 2. Mild chronic small vessel ischemic changes. 3. Mild atherosclerosis. 4. Acute findings discussed with and reconfirmed by Dr.Mayci Haning on 01/28/2018 at 1:00 am. Electronically Signed   By: Elon Alas M.D.   On: 01/28/2018 01:04   Dg Chest Port 1 View  Result Date: 01/28/2018 CLINICAL DATA:  Line placement EXAM: PORTABLE CHEST 1 VIEW COMPARISON:  01/28/2018 FINDINGS: Endotracheal tube  measures 6.8 cm above the carina. A left central venous catheter has been placed with tip over the confluence of the brachiocephalic vein and IVC. Shallow inspiration. Cardiac enlargement. Bilateral perihilar infiltrates. Probable small right pleural effusion. No pneumothorax is visible. IMPRESSION: Appliances appear in satisfactory position. Cardiac enlargement with bilateral perihilar infiltrates. Small right pleural effusion. No pneumothorax. Electronically Signed   By: Lucienne Capers M.D.   On: 01/28/2018 05:52   Dg Chest Portable 1 View  Result Date: 01/28/2018 CLINICAL DATA:  Short of breath EXAM: PORTABLE CHEST 1 VIEW COMPARISON:  01/24/2018, 01/21/2018 FINDINGS: Interval intubation, tip of the endotracheal tube is about 4 cm superior to the carina. Worsening bilateral interstitial and alveolar disease. No large effusion. Stable mild cardiomegaly. No pneumothorax. IMPRESSION: 1. Endotracheal tube tip about 3.5 cm superior to the carina. 2. Cardiomegaly. Interim worsening of bilateral interstitial and alveolar airspace disease. Electronically Signed   By: Donavan Foil M.D.   On: 01/28/2018 00:29    ____________________________________________   PROCEDURES  Procedure(s) performed: please, see procedure note(s).  Procedure Name: Intubation Date/Time: 01/28/2018 12:02 AM Performed by: Loney Hering, MD Pre-anesthesia Checklist: Patient identified, Patient being monitored, Emergency Drugs available and Suction available Oxygen Delivery Method: Ambu bag Preoxygenation: Pre-oxygenation with 100% oxygen Induction Type: Rapid sequence and Cricoid Pressure applied Ventilation: Mask ventilation without difficulty Laryngoscope Size: Mac and 4 Tube size: 7.0 mm Number of attempts: 1 Placement Confirmation: ETT inserted through vocal cords under direct vision,  CO2 detector,  Breath sounds checked- equal and bilateral and Positive ETCO2 Secured at: 24 cm Tube secured with: ETT  holder Difficulty Due To: Difficulty was unanticipated    .Critical Care Performed by: Loney Hering, MD Authorized by: Loney Hering, MD   Critical care provider statement:  Critical care time (minutes):  75   Critical care start time:  01/14/2018 11:53 PM   Critical care end time:  01/28/2018 2:00 AM   Critical care time was exclusive of:  Separately billable procedures and treating other patients   Critical care was necessary to treat or prevent imminent or life-threatening deterioration of the following conditions:  Cardiac failure, circulatory failure and respiratory failure   Critical care was time spent personally by me on the following activities:  Development of treatment plan with patient or surrogate, discussions with consultants, evaluation of patient's response to treatment, examination of patient, obtaining history from patient or surrogate, ordering and performing treatments and interventions, ordering and review of laboratory studies, ordering and review of radiographic studies, pulse oximetry, re-evaluation of patient's condition, review of old charts, ventilator management and vascular access procedures   I assumed direction of critical care for this patient from another provider in my specialty: no      Critical Care performed: Yes, see critical care note(s)  ____________________________________________   INITIAL IMPRESSION / ASSESSMENT AND PLAN / ED COURSE  As part of my medical decision making, I reviewed the following data within the electronic MEDICAL RECORD NUMBER Notes from prior ED visits and [place] Controlled Substance Database   This is a 57 year old male who comes into the hospital today in cardiac arrest.  The patient was brought into the emergency department and CPR was initiated.  We were bagging the patient in place leads pads on him to assess his rhythm.  The patient received 2 IO's.  His initial rhythm appeared to be asystole.  The patient then  received a dose of epinephrine, atropine, sodium bicarbonate, calcium and magnesium sulfate.  We continued giving the patient CPR.  The patient converted from asystole to PEA.  He was suctioned and intubated without any difficulty.  The patient had a lot of fluid in his posterior oropharynx as well as in the ET tube once he was intubated with a concern for some pulmonary edema and or aspiration.  As we went through the H's and T's we considered thrombosis as a cause for the patient's symptoms given his shortness of breath.  We gave the patient 50 mg of alteplase in the event his symptoms were due to thrombosis.  We continued to do CPR and gave the patient another 2 doses of epinephrine.  Approximately 5 minutes after receiving the alteplase the patient's pulse returned and his heart rate was in the 130s.  The patient's initial blood pressure was 151/80.  The decision was made to do a CT scan of the patient's head.  We also consider doing a CTA of the patient's chest looking for PE but the patient does have an allergy to contrast material.  The patient's blood pressure continued to increase and I did discuss with the hospitalist whether or not to start the patient on Nitropaste versus nitro drip.  The patient received a CT and I was called stating that it showed the beginnings of hypoxic ischemic encephalopathy.  We did draw some blood work by family stick and the patient's white blood cell count was 22.5.  His hemoglobin was 10.3 and his hematocrit was 30.9.  His initial lactic acid was 8.7 and he did have a blood gas that showed a pH of 6.2 with PCO2 of 97 PO2 of 106 a bicarb of 19.9.  I did receive a phone call from the intensivist and they did recommend initiating the code ice on the patient.  The patient then started having some seizure-like activities with some posturing.  I gave him 2 mg of Ativan and started him on a Versed drip.  I also ordered the patient some Keppra given his potential hypoxic ischemic  encephalopathy.  The patient also received a dose of Lasix.  He then started to drop his sats and he did sound like he had increased rales.  We increased the patient's PEEP to 10.  At that point the patient was taken up to the intensive care unit for further evaluation and treatment.      ____________________________________________   FINAL CLINICAL IMPRESSION(S) / ED DIAGNOSES  Final diagnoses:  Cardiopulmonary arrest with successful resuscitation ([place])  Acute pulmonary edema ([place])  Congestive heart failure, unspecified HF chronicity, unspecified heart failure type [facility])     ED Discharge Orders    None       Note:  This document was prepared using Dragon voice recognition software and may include unintentional dictation errors.    Loney Hering, MD 01/28/18 [phone]

## 2018-01-28 NOTE — Progress Notes (Signed)
Ms. Patria Mane, NP was alerted to pt.'s 7 beat run of VT. Sending BMP now, discussed previous runs of VT during day shift, discussed pt.'s low UOP and Mg level earlier. Will evaluate lab results, if K+ is < 3.3 will discuss possible treatment- in keeping with Dr. Council Mechanic instructions from prior shift. Will continue to monitor pt. Closely, other VSS.

## 2018-01-28 NOTE — ED Provider Notes (Signed)
IO insertion I placed a right tibial intraosseous line in usual sterile conditions with good withdrawal of marrow and easy flush  IO insertion I placed a left femoral intraosseous line in usual sterile conditions with good withdrawal of marrow and easy flush    Tyler Hong, MD 01/28/18 0001

## 2018-01-28 NOTE — ED Notes (Signed)
ICE BAGS x4 PLACED TO GROIN AND AXILLARY ARIC SUN NOT AVAILABLE

## 2018-01-28 NOTE — H&P (Signed)
[name]    MR#:  [id number]  DATE OF BIRTH:  07/13/1960  DATE OF ADMISSION:  01/06/2018  PRIMARY CARE PHYSICIAN: Marguerita Merles, MD   REQUESTING/REFERRING PHYSICIAN: Dahlia Client, MD  CHIEF COMPLAINT:  Cardiopulmonary arrest  HISTORY OF PRESENT ILLNESS:  [name]  is a 57 y.o. male who presents to the ED brought by his family in florid respiratory distress and probably was found to be in cardiac arrest.  Patient was brought in from the parking lot with pink frothy sputum and found to be pulseless.  He underwent cardiopulmonary resuscitation in the ED with return of spontaneous circulation.  He was intubated and hospitalist were called for admission.  Of note, patient was recently admitted here to the hospital for similar situation of flash pulmonary edema.  He has significant heart failure, and his pressure has been elevated here in the ED tonight as well.  PAST MEDICAL HISTORY:   Past Medical History:  Diagnosis Date  . Chest pain 2019  . Diabetes mellitus without complication ([place])   . Hypertension   . Sleep apnea      PAST SURGICAL HISTORY:   Past Surgical History:  Procedure Laterality Date  . COLONOSCOPY  2017  . LIPOMA EXCISION     Left forehead     SOCIAL HISTORY:   Social History   Tobacco Use  . Smoking status: Current Every Day Smoker    Packs/day: 0.30    Years: 32.00    Pack years: 9.60    Types: Cigarettes  . Smokeless tobacco: Never Used  Substance Use Topics  . Alcohol use: No     FAMILY HISTORY:   Family History  Problem Relation Age of Onset  . Diabetes Father   . Colon cancer Neg Hx      DRUG ALLERGIES:   Allergies  Allergen Reactions  . Enalapril Maleate Anaphylaxis  . Iodinated Diagnostic Agents Itching  . Isosorbide Mononitrate Er [Isosorbide Dinitrate] Other (See Comments)    "made me not be able to think or function"  . Canagliflozin Other  (See Comments)    MEDICATIONS AT HOME:   Prior to Admission medications   Medication Sig Start Date End Date Taking? Authorizing Provider  ALPRAZolam Duanne Moron) 0.5 MG tablet Take 0.5 mg by mouth 2 (two) times daily as needed for anxiety. 01/26/18  Yes [provider]  furosemide (LASIX) 20 MG tablet Take 1 tablet (20 mg total) by mouth daily. 01/25/18  Yes Salary, [name], MD  hydrALAZINE (APRESOLINE) 25 MG tablet Take 1 tablet (25 mg total) by mouth every 8 (eight) hours. 01/24/18  Yes Salary, [name], MD  metoprolol tartrate (LOPRESSOR) 50 MG tablet Take 1 tablet (50 mg total) by mouth 2 (two) times daily. 01/24/18  Yes Salary, [name], MD  moxifloxacin (AVELOX) 400 MG tablet Take 1 tablet (400 mg total) by mouth daily for 7 days. 01/24/18 01/31/18 Yes Salary, Montell D, MD  aspirin EC 81 MG tablet Take 81 mg by mouth daily.    [provider]  HYDROcodone-acetaminophen (NORCO/VICODIN) 5-325 MG tablet Take 1 tablet by mouth every 4 (four) hours as needed. Patient not taking: Reported on 01/20/2018 12/13/17   Harvest Dark, MD  losartan (COZAAR) 25 MG tablet Take 25 mg by mouth daily. 08/26/16   [provider]  metFORMIN (GLUCOPHAGE-XR) 500 MG 24 hr tablet Take 2,000 mg by mouth daily.     [provider]  polyethylene glycol powder (GLYCOLAX/MIRALAX) powder Take 17 g by mouth daily as needed for moderate constipation. Patient not taking: Reported on 01/20/2018 12/13/17   Harvest Dark, MD  saxagliptin HCl (ONGLYZA) 5 MG TABS tablet Take 5 mg by mouth daily.     [provider]    REVIEW OF SYSTEMS:  Review of Systems  Unable to perform ROS: Critical illness     VITAL SIGNS:   Vitals:   01/28/18 0015 01/28/18 0018  BP:  (!) 151/80  Pulse: (!) 114 (!) 103  Resp: 18 18  SpO2: 97% 93%   Wt Readings from Last 3 Encounters:  01/23/18 103.9 kg (229 lb 0.9 oz)  12/13/17 102.1 kg (225 lb)  11/08/17 108 kg (238 lb)    PHYSICAL  EXAMINATION:  Physical Exam  Vitals reviewed. Constitutional: He appears well-developed and well-nourished. No distress.  HENT:  Head: Normocephalic and atraumatic.  Mouth/Throat: Oropharynx is clear and moist.  Eyes: Pupils are equal, round, and reactive to light. Conjunctivae and EOM are normal. No scleral icterus.  Neck: Normal range of motion. Neck supple. No JVD present. No thyromegaly present.  Cardiovascular: Regular rhythm and intact distal pulses. Exam reveals no gallop and no friction rub.  No murmur heard. Tachycardic  Respiratory: He is in respiratory distress (Intubated and mechanically ventilated). He has no wheezes. He has rales (Bilaterally, all lung fields).  GI: Soft. Bowel sounds are normal. He exhibits no distension. There is no tenderness.  Musculoskeletal: Normal range of motion. He exhibits no edema.  No arthritis, no gout  Lymphadenopathy:    He has no cervical adenopathy.  Neurological:  Unable to assess due to critical illness  Skin: Skin is warm and dry. No rash noted. No erythema.  Psychiatric:  Unable to assess due to critical illness    LABORATORY PANEL:   CBC Recent Labs  Lab 01/24/18 0910  WBC 17.2*  HGB 12.1*  HCT 36.0*  PLT 233   ------------------------------------------------------------------------------------------------------------------  Chemistries  Recent Labs  Lab 01/23/18 0538 01/24/18 0910  NA 140 138  K 3.5 3.6  CL 108 105  CO2 24 28  GLUCOSE 146* 183*  BUN 21* 18  CREATININE 0.99 0.96  CALCIUM 8.2* 8.5*  MG 2.0  --    ------------------------------------------------------------------------------------------------------------------  Cardiac Enzymes Recent Labs  Lab 01/21/18 0536  TROPONINI 0.36*   ------------------------------------------------------------------------------------------------------------------  RADIOLOGY:  Dg Chest Portable 1 View  Result Date: 01/28/2018 CLINICAL DATA:  Short of breath  EXAM: PORTABLE CHEST 1 VIEW COMPARISON:  01/24/2018, 01/21/2018 FINDINGS: Interval intubation, tip of the endotracheal tube is about 4 cm superior to the carina. Worsening bilateral interstitial and alveolar disease. No large effusion. Stable mild cardiomegaly. No pneumothorax. IMPRESSION: 1. Endotracheal tube tip about 3.5 cm superior to the carina. 2. Cardiomegaly. Interim worsening of bilateral interstitial and alveolar airspace disease. Electronically Signed   By: Donavan Foil M.D.   On: 01/28/2018 00:29    EKG:   Orders placed or performed during the hospital encounter of 01/16/2018  . EKG 12-Lead  . EKG 12-Lead    IMPRESSION AND PLAN:  Principal Problem:   Cardiopulmonary arrest with successful resuscitation ([place]) -likely due to primarily respiratory failure which led to cardiac arrest.  His respiratory failure is likely secondary to flash pulmonary edema in the setting of significant heart failure and elevated blood pressure.  He was resuscitated in the ED, intubated, and will be admitted to the ICU for further care with intensivist consult Active Problems:  Acute on chronic combined systolic and diastolic CHF (congestive heart failure) ([place]) -intubated, sedated, see above and below for other treatment   Flash pulmonary edema ([place]) -currently intubated, will likely need diuresis   Accelerated hypertension -patient will need antihypertensives, diuresis   Diabetes ([facility]) -sliding scale insulin with corresponding glucose checks  Chart review performed and case discussed with ED provider. Labs, imaging and/or ECG reviewed by provider and discussed with patient/family. Management plans discussed with the patient and/or family.  DVT PROPHYLAXIS: SubQ lovenox  GI PROPHYLAXIS: H2 blocker  ADMISSION STATUS: Inpatient  CODE STATUS: Full Code Status History    Date Active Date Inactive Code Status Order ID Comments User Context   01/20/2018 0414 01/24/2018 1627 Full Code [id number]   Arta Silence, MD ED      TOTAL CRITICAL CARE TIME TAKING CARE OF THIS PATIENT: 50 minutes.   Dalena Plantz [place] 01/28/2018, 12:47 AM  [facility]  Office  [phone]  CC: Primary care physician; Marguerita Merles, MD  Note:  This document was prepared using Dragon voice recognition software and may include unintentional dictation errors.

## 2018-01-28 NOTE — Consult Note (Signed)
Cardiology Consultation Note    Patient ID: [name], MRN: [id number], DOB/AGE: 1960/10/16 57 y.o. Admit date: 01/07/2018   Date of Consult: 01/28/2018 Primary Physician: Marguerita Merles, MD Primary Cardiologist: Dr. Saralyn Pilar  Chief Complaint: respiratory arrest   Reason for Consultation: s/p cardiopulmonary arrest Requesting MD: Dr. Verdell Carmine  HPI: [name] is a 57 y.o. male with history of dilated cardiomyopathy who was admitted after developing rather acute onset of pulmonary edema.  Patient was recently hospitalized with hypercapnic respiratory arrest.  Was discharged several days ago.  Patient's wife noted him to be progressively shortness of breath and brought him to the emergency room.  On arrival to the emergency room the patient was unresponsive and pulseless.  He had no respirations.  CPR was initiated and continued for approximately 17 minutes in the emergency room.  It is unclear how long he had no respiratory effort or heart rate on transport by private auto.  He was noted to have pulmonary edema metabolic and respiratory acidosis, lactic acidosis and acute renal insufficiency in the emergency room.  He is currently on norepinephrine and vasopressin with empiric antibiotics.  Mildly elevated serum troponin likely secondary to demand ischemia.  Patient is unresponsive and intubated.  Past Medical History:  Diagnosis Date  . Chest pain 2019  . Diabetes mellitus without complication ([place])   . Hypertension   . Sleep apnea       Surgical History:  Past Surgical History:  Procedure Laterality Date  . COLONOSCOPY  2017  . LIPOMA EXCISION     Left forehead     Home Meds: Prior to Admission medications   Medication Sig Start Date End Date Taking? Authorizing Provider  ALPRAZolam Duanne Moron) 0.5 MG tablet Take 0.5 mg by mouth 2 (two) times daily as needed for anxiety. 01/26/18  Yes [provider]  furosemide (LASIX) 20 MG tablet Take 1 tablet (20 mg total) by  mouth daily. 01/25/18  Yes Salary, [name], MD  hydrALAZINE (APRESOLINE) 25 MG tablet Take 1 tablet (25 mg total) by mouth every 8 (eight) hours. 01/24/18  Yes Salary, [name], MD  metoprolol tartrate (LOPRESSOR) 50 MG tablet Take 1 tablet (50 mg total) by mouth 2 (two) times daily. 01/24/18  Yes Salary, [name], MD  moxifloxacin (AVELOX) 400 MG tablet Take 1 tablet (400 mg total) by mouth daily for 7 days. 01/24/18 01/31/18 Yes Salary, Montell D, MD  aspirin EC 81 MG tablet Take 81 mg by mouth daily.    [provider]  HYDROcodone-acetaminophen (NORCO/VICODIN) 5-325 MG tablet Take 1 tablet by mouth every 4 (four) hours as needed. Patient not taking: Reported on 01/20/2018 12/13/17   Harvest Dark, MD  losartan (COZAAR) 25 MG tablet Take 25 mg by mouth daily. 08/26/16   [provider]  metFORMIN (GLUCOPHAGE-XR) 500 MG 24 hr tablet Take 2,000 mg by mouth daily.     [provider]  polyethylene glycol powder (GLYCOLAX/MIRALAX) powder Take 17 g by mouth daily as needed for moderate constipation. Patient not taking: Reported on 01/20/2018 12/13/17   Harvest Dark, MD  saxagliptin HCl (ONGLYZA) 5 MG TABS tablet Take 5 mg by mouth daily.     [provider]    Inpatient Medications:  . artificial tears  1 application Both Eyes Z9D  . chlorhexidine gluconate (MEDLINE KIT)  15 mL Mouth Rinse BID  . cisatracurium  0.1 mg/kg Intravenous Once  . enoxaparin (LOVENOX) injection  40 mg Subcutaneous Q24H  .  insulin regular  0-10 Units Intravenous TID WC  . ipratropium-albuterol  3 mL Nebulization Q6H  . mouth rinse  15 mL Mouth Rinse 10 times per day  . sodium chloride flush  10-40 mL Intracatheter Q12H   . sodium chloride    . sodium chloride 10 mL/hr at 01/28/18 0800  . sodium chloride    . ceFEPime (MAXIPIME) IV Stopped (01/28/18 0825)  . cisatracurium (NIMBEX) infusion Stopped (01/28/18 0300)  . dextrose 5 % and 0.45% NaCl Stopped (01/28/18 0553)  .  famotidine (PEPCID) IV Stopped (01/28/18 0559)  . fentaNYL infusion INTRAVENOUS 150 mcg/hr (01/28/18 0844)  . insulin (NOVOLIN-R) infusion 12 Units/hr (01/28/18 0953)  . midazolam (VERSED) infusion Stopped (01/28/18 0309)  . norepinephrine (LEVOPHED) Adult infusion 20 mcg/min (01/28/18 0925)  . propofol (DIPRIVAN) infusion 50 mcg/kg/min (01/28/18 0844)  . vancomycin    . vasopressin (PITRESSIN) infusion - *FOR SHOCK* 0.03 Units/min (01/28/18 0454)    Allergies:  Allergies  Allergen Reactions  . Enalapril Maleate Anaphylaxis  . Iodinated Diagnostic Agents Itching  . Isosorbide Mononitrate Er [Isosorbide Dinitrate] Other (See Comments)    "made me not be able to think or function"  . Canagliflozin Other (See Comments)    Social History   Socioeconomic History  . Marital status: Married    Spouse name: Not on file  . Number of children: Not on file  . Years of education: Not on file  . Highest education level: Not on file  Occupational History  . Not on file  Social Needs  . Financial resource strain: Not on file  . Food insecurity:    Worry: Not on file    Inability: Not on file  . Transportation needs:    Medical: Not on file    Non-medical: Not on file  Tobacco Use  . Smoking status: Current Every Day Smoker    Packs/day: 0.30    Years: 32.00    Pack years: 9.60    Types: Cigarettes  . Smokeless tobacco: Never Used  Substance and Sexual Activity  . Alcohol use: No  . Drug use: Never  . Sexual activity: Not on file  Lifestyle  . Physical activity:    Days per week: Not on file    Minutes per session: Not on file  . Stress: Not on file  Relationships  . Social connections:    Talks on phone: Not on file    Gets together: Not on file    Attends religious service: Not on file    Active member of club or organization: Not on file    Attends meetings of clubs or organizations: Not on file    Relationship status: Not on file  . Intimate partner violence:     Fear of current or ex partner: Not on file    Emotionally abused: Not on file    Physically abused: Not on file    Forced sexual activity: Not on file  Other Topics Concern  . Not on file  Social History Narrative  . Not on file     Family History  Problem Relation Age of Onset  . Diabetes Father   . Colon cancer Neg Hx      Review of Systems: A 12-system review of systems was performed and is negative except as noted in the HPI.  Labs: Recent Labs    01/28/18 0034 01/28/18 0433 01/28/18 0816  TROPONINI 0.07* 0.50* 0.90*   Lab Results  Component Value Date   WBC 33.8 (  H) 01/28/2018   HGB 11.9 (L) 01/28/2018   HCT 34.5 (L) 01/28/2018   MCV 93.5 01/28/2018   PLT 268 01/28/2018    Recent Labs  Lab 01/28/18 0433 01/28/18 0816  NA 141 140  K 4.7 4.5  CL 105 107  CO2 27 24  BUN 23* 24*  CREATININE 1.77* 1.86*  CALCIUM 9.1 8.5*  PROT 6.7  --   BILITOT 1.0  --   ALKPHOS 54  --   ALT 36  --   AST 59*  --   GLUCOSE 333* 383*   Lab Results  Component Value Date   CHOL 191 01/20/2018   HDL NOT REPORTED DUE TO HIGH TRIGLYCERIDES 01/20/2018   LDLCALC UNABLE TO CALCULATE IF TRIGLYCERIDE OVER 400 mg/dL 01/20/2018   TRIG 222 (H) 01/28/2018   No results found for: DDIMER  Radiology/Studies:  Dg Abd 1 View  Result Date: 01/28/2018 CLINICAL DATA:  NG tube placement EXAM: ABDOMEN - 1 VIEW COMPARISON:  01/20/2018 FINDINGS: No enteric tube is demonstrated within the field of view. Mild gaseous distention of the stomach. IMPRESSION: No enteric tube visualized. Electronically Signed   By: Lucienne Capers M.D.   On: 01/28/2018 05:53   Dg Abd 1 View  Result Date: 01/20/2018 CLINICAL DATA:  Encounter for NG tube placement EXAM: ABDOMEN - 1 VIEW COMPARISON:  01/20/2018 FINDINGS: Malpositioned nasogastric tube, coiled backwards overlying the mid esophagus with tip excluded from view Endotracheal tube overlies the tracheal air column with tip superior to the carina. No  pneumothorax. Pacer leads overlie the left ventricular apex. Pulmonary vasculature remains indistinct with cephalization of flow. Suspected trace right-sided pleural effusion. No definite left-sided pleural effusion, though the left costophrenic angle is excluded from view. Nonobstructive bowel gas pattern. No pneumoperitoneum, pneumatosis or portal venous gas. IMPRESSION: 1. Malpositioned nasogastric tube.  Repositioning is advised. 2. Similar findings of pulmonary edema and trace right-sided pleural effusion. 3. Nonobstructive bowel gas pattern. Electronically Signed   By: Sandi Mariscal M.D.   On: 01/20/2018 14:03   Dg Abd 1 View  Result Date: 01/20/2018 CLINICAL DATA:  NG tube placement EXAM: ABDOMEN - 1 VIEW COMPARISON:  None. FINDINGS: There is no nasogastric tube identified in the lower chest or abdomen. There is mild gaseous distension of the colon and stomach. There is no evidence of pneumoperitoneum, portal venous gas or pneumatosis. There are no pathologic calcifications along the expected course of the ureters. The osseous structures are unremarkable. IMPRESSION: No nasogastric tube identified in the lower chest or abdomen. Electronically Signed   By: Kathreen Devoid   On: 01/20/2018 12:42   Dg Abd 1 View  Result Date: 01/20/2018 CLINICAL DATA:  Abdominal pain. EXAM: ABDOMEN - 1 VIEW COMPARISON:  None. FINDINGS: A right-sided pleural effusion is suspected layering posteriorly. Pacer paddles are noted on the chest. The bowel gas pattern is unremarkable. Air and stool scattered throughout the colon. A few scattered air-filled small bowel loops. No obvious free air. A right femoral line is noted. Bony structures are unremarkable. IMPRESSION: Suspect right pleural effusion. No plain film findings for an acute abdominal process. Electronically Signed   By: Marijo Sanes M.D.   On: 01/20/2018 11:22   Ct Head Wo Contrast  Result Date: 01/28/2018 CLINICAL DATA:  Cardiac arrest. History of hypertension and  diabetes. EXAM: CT HEAD WITHOUT CONTRAST TECHNIQUE: Contiguous axial images were obtained from the base of the skull through the vertex without intravenous contrast. COMPARISON:  CT neck January 20, 2018. FINDINGS: BRAIN:  No intraparenchymal hemorrhage, mass effect nor midline shift. Mild blurring of the gray-white matter differentiation. Patchy supratentorial white matter hypodensities. The ventricles and sulci are normal. No acute large vascular territory infarcts. No abnormal extra-axial fluid collections. Basal cisterns are patent. VASCULAR: Mild calcific atherosclerosis carotid bifurcations. SKULL/SOFT TISSUES: No skull fracture. No significant soft tissue swelling. ORBITS/SINUSES: The included ocular globes and orbital contents are normal.Mild paranasal sinus mucosal thickening without air-fluid levels. Included mastoid air cells are well aerated. OTHER: Life-support lines in place. IMPRESSION: 1. 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 2. Mild chronic small vessel ischemic changes. 3. Mild atherosclerosis. 4. Acute findings discussed with and reconfirmed by Dr.ALLISON WEBSTER on 01/28/2018 at 1:00 am. Electronically Signed   By: Elon Alas M.D.   On: 01/28/2018 01:04   Ct Soft Tissue Neck Wo Contrast  Result Date: 01/20/2018 CLINICAL DATA:  57 y/o  M; thyroid nodule. EXAM: CT NECK WITHOUT CONTRAST TECHNIQUE: Multidetector CT imaging of the neck was performed following the standard protocol without intravenous contrast. COMPARISON:  None. FINDINGS: Pharynx and larynx: Debris within the airways from intubation. Salivary glands: No inflammation, mass, or stone. Thyroid: 5.6 cm nodule within the left lobe of the thyroid. 18 mm nodule in the right lobe of thyroid. Lymph nodes: None enlarged or abnormal density. Vascular: Negative. Limited intracranial: Negative. Visualized orbits: Negative. Mastoids and visualized paranasal sinuses: Mild mucosal thickening of the left maxillary  sinus. Normal aeration of the mastoid air cells. Skeleton: No acute or aggressive process. Dental disease with multiple periapical cysts and absent crowns. Mild cervical spondylosis greatest at the C5-6 level. Upper chest: Endotracheal tube tip 3.4 cm above the carina. Moderate bilateral pleural effusions and dependent atelectasis of the lungs. Other: None. IMPRESSION: 1. Thyroid nodules measuring up to 5.6 cm in the left lobe of thyroid. Mass effect from the large left lobe of thyroid nodule displaces the airway rightward. 2. Endotracheal tube tip 3.4 cm above the carina. 3. Moderate bilateral pleural effusion with dependent atelectasis of the lungs. Electronically Signed   By: Kristine Garbe M.D.   On: 01/20/2018 04:44   Dg Chest Port 1 View  Result Date: 01/28/2018 CLINICAL DATA:  Line placement EXAM: PORTABLE CHEST 1 VIEW COMPARISON:  01/28/2018 FINDINGS: Endotracheal tube measures 6.8 cm above the carina. A left central venous catheter has been placed with tip over the confluence of the brachiocephalic vein and IVC. Shallow inspiration. Cardiac enlargement. Bilateral perihilar infiltrates. Probable small right pleural effusion. No pneumothorax is visible. IMPRESSION: Appliances appear in satisfactory position. Cardiac enlargement with bilateral perihilar infiltrates. Small right pleural effusion. No pneumothorax. Electronically Signed   By: Lucienne Capers M.D.   On: 01/28/2018 05:52   Dg Chest Portable 1 View  Result Date: 01/28/2018 CLINICAL DATA:  Short of breath EXAM: PORTABLE CHEST 1 VIEW COMPARISON:  01/24/2018, 01/21/2018 FINDINGS: Interval intubation, tip of the endotracheal tube is about 4 cm superior to the carina. Worsening bilateral interstitial and alveolar disease. No large effusion. Stable mild cardiomegaly. No pneumothorax. IMPRESSION: 1. Endotracheal tube tip about 3.5 cm superior to the carina. 2. Cardiomegaly. Interim worsening of bilateral interstitial and alveolar  airspace disease. Electronically Signed   By: Donavan Foil M.D.   On: 01/28/2018 00:29   Dg Chest Port 1 View  Result Date: 01/24/2018 CLINICAL DATA:  Respiratory failure EXAM: PORTABLE CHEST 1 VIEW COMPARISON:  01/21/2018, 01/20/2018, 12/13/2017 FINDINGS: Removal of the endotracheal tube. Slight increased upper lobe ground-glass opacity. Improved aeration at the bases. Stable slightly enlarged  cardiomediastinal silhouette. No pneumothorax. IMPRESSION: 1. Removal of endotracheal tube 2. Improved aeration at the bilateral lung bases. Slight interval increase in bilateral upper lobe ground-glass opacity. 3. Stable mild cardiomegaly Electronically Signed   By: Donavan Foil M.D.   On: 01/24/2018 03:58   Dg Chest Port 1 View  Result Date: 01/21/2018 CLINICAL DATA:  Acute respiratory failure. EXAM: PORTABLE CHEST 1 VIEW COMPARISON:  01/20/2018 FINDINGS: The endotracheal tube is in good position at the mid tracheal level. The lungs demonstrate improved aeration with resolving edema and atelectasis. Probable persistent small layering right pleural effusion. IMPRESSION: Stable position of the endotracheal tube. Improved lung aeration with resolving edema and atelectasis. Electronically Signed   By: Marijo Sanes M.D.   On: 01/21/2018 08:36   Dg Chest Portable 1 View  Result Date: 01/20/2018 CLINICAL DATA:  57 y/o  M; status post intubation. EXAM: PORTABLE CHEST 1 VIEW COMPARISON:  01/20/2018 chest radiograph FINDINGS: Stable cardiomegaly given projection and technique. Interstitial and alveolar pulmonary edema. Probable small effusions. Endotracheal tube tip 4.5 cm above the carina. Transcutaneous pacing pads noted. Bones are unremarkable. IMPRESSION: 1. Endotracheal tube tip 4.5 cm above the carina. 2. Stable cardiomegaly and pulmonary edema. Probable small effusions. Electronically Signed   By: Kristine Garbe M.D.   On: 01/20/2018 03:34   Dg Chest Port 1 View  Result Date: 01/20/2018 CLINICAL  DATA:  Respiratory distress EXAM: PORTABLE CHEST 1 VIEW COMPARISON:  12/13/2017 FINDINGS: Deviated trachea to the right secondary to known left thyromegaly. Stable cardiomegaly. Nonaneurysmal thoracic aorta. Diffuse bilateral airspace disease and vascular congestion consistent with pulmonary edema. No significant effusion or pneumothorax. No acute osseous abnormality. IMPRESSION: Stable cardiomegaly with pulmonary edema. Superimposed pneumonia is not entirely excluded but believed less likely. Electronically Signed   By: Ashley Royalty M.D.   On: 01/20/2018 02:56   Dg Addison Bailey G Tube Plc W/fl W/rad  Result Date: 01/20/2018 CLINICAL DATA:  Patient presents for Dobbhoff tube placement. EXAM: NASO G TUBE PLACEMENT WITH FL AND WITH RAD FLUOROSCOPY TIME:  Fluoroscopy Time:  0.3 minute Radiation Exposure Index (if provided by the fluoroscopic device): 1.3 mGy Number of Acquired Spot Images: 0 COMPARISON:  None. FINDINGS: Multiple attempts at placing a Dobbhoff tube were made through the right and left nare. The tip of the Dobbhoff tube would not progressed beyond the level of the mid trachea on repeated attempts. Attempts were made at advancing the Dobbhoff tube following deflating the tracheostomy cuff, but the Dobbhoff tube could not advance any further. Examination was terminated at this time. IMPRESSION: 1. Multiple unsuccessful attempts at placing a Dobbhoff tube which would not progressed beyond the level of the mid trachea Electronically Signed   By: Kathreen Devoid   On: 01/20/2018 16:41    Wt Readings from Last 3 Encounters:  01/28/18 110.6 kg (243 lb 13.3 oz)  01/23/18 103.9 kg (229 lb 0.9 oz)  12/13/17 102.1 kg (225 lb)    EKG: Sinus rhythm with anterolateral T wave inversions.  Physical Exam: Intubated Blood pressure 108/80, pulse (!) 47, temperature (!) 90.1 F (32.3 C), resp. rate (!) 25, weight 110.6 kg (243 lb 13.3 oz), SpO2 94 %. Body mass index is 34.99 kg/m. General: Well developed, well  nourished, Head: Normocephalic, atraumatic, sclera non-icteric, no xanthomas, nares are without discharge.  Neck: Negative for carotid bruits. JVD not elevated. Lungs: Decreased breath sounds bilaterally. Heart: RRR with S1 S2. No murmurs, rubs, or gallops appreciated. Abdomen: Soft, non-tender, non-distended with normoactive bowel sounds. No  hepatomegaly. No rebound/guarding. No obvious abdominal masses. Msk:  Strength and tone appear normal for age. Extremities: No clubbing or cyanosis. No edema.  Distal pedal pulses are 2+ and equal bilaterally. Neuro: Intubated and sedated.     Assessment and Plan  57 year old male with a reduced LV function EF 20 to 25% by echo earlier this week admitted with probable respiratory arrest followed by cardiac arrest and prolonged CPR.  Patient is currently intubated, unresponsive, pupils are fixed.  On levo fed for hemodynamic instability.  At approximately 20 to 30 minutes of CPR.  Mildly elevated serum troponin at 0.9.  7 days ago his troponin was 0.36 and 0.07 on admission.  Patient has acute renal insufficiency with a creatinine 1.86 up from 0.964 days ago.  Chest x-ray shows cardiomegaly with perihilar infiltrates.  Patient presented with flash pulmonary edema requiring intubation.  Had a similar presentation earlier this week and was intubated.  Was discharged on 7/23.2  Respiratory failure-continue with vent support and wean as tolerated.  Cardiac arrest-likely secondary to flash pulmonary edema.  Currently on code ice.  Given prolonged CPR would defer urgent cardiac catheterization at present.  Would attempt to treat his pulmonary edema and hemodynamic instability.  Does not appear to be actively ischemic based on electrocardiogram.  We will repeat echo to determine if there is any interval change in his wall motion or LV function since one done 1 week ago.  Would continue with empiric antibiotics norepinephrine and vasopressin.  We will follow with you.      Signed, Teodoro Spray MD 01/28/2018, 10:25 AM Pager: [phone]

## 2018-01-28 NOTE — Consult Note (Signed)
PULMONARY / CRITICAL CARE MEDICINE   Name: [name] MRN: [id number] DOB: July 11, 1960    ADMISSION DATE:  01/30/2018   CONSULTATION DATE:  01/28/2018  REFERRING Tyler:  Dr. Jannifer Franklin  REASON: Cardiac arrest secondary to pulmonary edema  HISTORY OF PRESENT ILLNESS: This is a 57 year old African-American male with a history of type 2 diabetes hypertension and sleep apnea who was brought to the ED by his spouse in cardiac and respiratory arrest.  History is obtained from patient's wife as patient is currently intubated.  Per patient's wife, patient became progressively short of breath hence she decided to bring him to the emergency room for evaluation.  En route to the emergency room patient became unresponsive.  When she arrived to the emergency room she notified the staff and when staff arrived and pulled patient out of the car patient was pulseless with no respirations.  CPR was initiated.  CPR was initiated at 11:50 PM and continued till 12:07 AM a total downtime of 17 minutes.  Downtime prior to ED arrival is unknown.  His ED work-up was significant for severe pulmonary edema on chest x-ray, severe metabolic and respiratory acidosis, severe lactic acidosis, acute renal failure and mildly elevated troponin. Patient was recently hospitalized with acute hypoxic/hypercarbic respiratory failure, elevated troponin, acidosis and sepsis of unknown origin.  Last echocardiogram done on January 20, 2018 showed an EF of 20 to 25% with severe systolic heart failure  PAST MEDICAL HISTORY :  He  has a past medical history of Chest pain (2019), Diabetes mellitus without complication ([place]), Hypertension, and Sleep apnea.  PAST SURGICAL HISTORY: He  has a past surgical history that includes Lipoma excision and Colonoscopy (2017).  Allergies  Allergen Reactions  . Enalapril Maleate Anaphylaxis  . Iodinated Diagnostic Agents Itching  . Isosorbide Mononitrate Er [Isosorbide Dinitrate] Other (See Comments)    "made  me not be able to think or function"  . Canagliflozin Other (See Comments)    No current facility-administered medications on file prior to encounter.    Current Outpatient Medications on File Prior to Encounter  Medication Sig  . ALPRAZolam (XANAX) 0.5 MG tablet Take 0.5 mg by mouth 2 (two) times daily as needed for anxiety.  . furosemide (LASIX) 20 MG tablet Take 1 tablet (20 mg total) by mouth daily.  . hydrALAZINE (APRESOLINE) 25 MG tablet Take 1 tablet (25 mg total) by mouth every 8 (eight) hours.  . metoprolol tartrate (LOPRESSOR) 50 MG tablet Take 1 tablet (50 mg total) by mouth 2 (two) times daily.  Marland Kitchen moxifloxacin (AVELOX) 400 MG tablet Take 1 tablet (400 mg total) by mouth daily for 7 days.  Marland Kitchen aspirin EC 81 MG tablet Take 81 mg by mouth daily.  Marland Kitchen HYDROcodone-acetaminophen (NORCO/VICODIN) 5-325 MG tablet Take 1 tablet by mouth every 4 (four) hours as needed. (Patient not taking: Reported on 01/20/2018)  . losartan (COZAAR) 25 MG tablet Take 25 mg by mouth daily.  . metFORMIN (GLUCOPHAGE-XR) 500 MG 24 hr tablet Take 2,000 mg by mouth daily.   . polyethylene glycol powder (GLYCOLAX/MIRALAX) powder Take 17 g by mouth daily as needed for moderate constipation. (Patient not taking: Reported on 01/20/2018)  . saxagliptin HCl (ONGLYZA) 5 MG TABS tablet Take 5 mg by mouth daily.     FAMILY HISTORY:  His family history includes Diabetes in his father. There is no history of Colon cancer.  SOCIAL HISTORY: He  reports that he has been smoking cigarettes.  He has a 9.60 pack-year  smoking history. He has never used smokeless tobacco. He reports that he does not drink alcohol or use drugs.  REVIEW OF SYSTEMS:   Unable to obtain as patient is currently intubated and sedated  SUBJECTIVE:   VITAL SIGNS: BP (!) 151/95   Pulse (!) 131   Temp (!) 100.6 F (38.1 C)   Resp 19   SpO2 (!) 89%   HEMODYNAMICS:    VENTILATOR SETTINGS: Vent Mode: AC FiO2 (%):  [100 %] 100 % Set Rate:  [20  bmp] 20 bmp Vt Set:  [500 mL] 500 mL PEEP:  [5 cmH20] 5 cmH20  INTAKE / OUTPUT: No intake/output data recorded.  PHYSICAL EXAMINATION: General: Pale, chronically ill looking Neuro: Generalized myoclonus, does not follow commands, does not withdraw to pain, no gag reflex HEENT: Normocephalic and atraumatic, pupils pinpoint and sluggish Cardiovascular: Apical pulse regular, S1-S2, no murmur regurg or gallop, +2 pulses bilaterally, +2 edema Lungs: Bilateral breath sounds, with diffuse Rales and crackles in all lung fields Abdomen: Distended, hypoactive bowel sounds, palpation reveals no organomegaly Musculoskeletal: Positive range of motion, no joint swelling Skin: Warm and dry  LABS:  BMET Recent Labs  Lab 01/23/18 0538 01/24/18 0910 01/28/18 0034  NA 140 138 144  K 3.5 3.6 3.7  CL 108 105 107  CO2 24 28 23   BUN 21* 18 14  CREATININE 0.99 0.96 1.47*  GLUCOSE 146* 183* 317*    Electrolytes Recent Labs  Lab 01/21/18 0536 01/22/18 0617 01/23/18 0538 01/24/18 0910 01/28/18 0034  CALCIUM 8.0* 8.2* 8.2* 8.5* 9.8  MG 2.1 1.9 2.0  --   --   PHOS 3.0  --   --   --   --     CBC Recent Labs  Lab 01/23/18 0538 01/24/18 0910 01/28/18 0034  WBC 19.1* 17.2* 22.5*  HGB 11.7* 12.1* 10.3*  HCT 34.7* 36.0* 30.9*  PLT 204 233 288    Coag's No results for input(s): APTT, INR in the last 168 hours.  Sepsis Markers Recent Labs  Lab 01/21/18 0536 01/24/18 0517 01/28/18 0034  LATICACIDVEN 1.0  --  8.7*  PROCALCITON  --  0.14  --     ABG Recent Labs  Lab 01/28/18 0018  PHART 6.92*  PCO2ART 97*  PO2ART 106    Liver Enzymes Recent Labs  Lab 01/28/18 0034  AST 58*  ALT 31  ALKPHOS 49  BILITOT 0.5  ALBUMIN 2.2*    Cardiac Enzymes Recent Labs  Lab 01/21/18 0536 01/28/18 0034  TROPONINI 0.36* 0.07*    Glucose Recent Labs  Lab 01/23/18 1127 01/23/18 1648 01/23/18 2212 01/24/18 0736 01/24/18 1150 01/28/18 0004  GLUCAP 193* 124* 182* 187* 220*  265*    Imaging Ct Head Wo Contrast  Result Date: 01/28/2018 CLINICAL DATA:  Cardiac arrest. History of hypertension and diabetes. EXAM: CT HEAD WITHOUT CONTRAST TECHNIQUE: Contiguous axial images were obtained from the base of the skull through the vertex without intravenous contrast. COMPARISON:  CT neck January 20, 2018. FINDINGS: BRAIN: No intraparenchymal hemorrhage, mass effect nor midline shift. Mild blurring of the gray-white matter differentiation. Patchy supratentorial white matter hypodensities. The ventricles and sulci are normal. No acute large vascular territory infarcts. No abnormal extra-axial fluid collections. Basal cisterns are patent. VASCULAR: Mild calcific atherosclerosis carotid bifurcations. SKULL/SOFT TISSUES: No skull fracture. No significant soft tissue swelling. ORBITS/SINUSES: The included ocular globes and orbital contents are normal.Mild paranasal sinus mucosal thickening without air-fluid levels. Included mastoid air cells are well aerated. OTHER: Life-support lines  in place. IMPRESSION: 1. 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 2. Mild chronic small vessel ischemic changes. 3. Mild atherosclerosis. 4. Acute findings discussed with and reconfirmed by Dr.ALLISON WEBSTER on 01/28/2018 at 1:00 am. Electronically Signed   By: Elon Alas M.D.   On: 01/28/2018 01:04   Dg Chest Portable 1 View  Result Date: 01/28/2018 CLINICAL DATA:  Short of breath EXAM: PORTABLE CHEST 1 VIEW COMPARISON:  01/24/2018, 01/21/2018 FINDINGS: Interval intubation, tip of the endotracheal tube is about 4 cm superior to the carina. Worsening bilateral interstitial and alveolar disease. No large effusion. Stable mild cardiomegaly. No pneumothorax. IMPRESSION: 1. Endotracheal tube tip about 3.5 cm superior to the carina. 2. Cardiomegaly. Interim worsening of bilateral interstitial and alveolar airspace disease. Electronically Signed   By: Donavan Foil M.D.   On: 01/28/2018  00:29   STUDIES:  2D echo pending  CULTURES: Blood cultures x2 Sputum culture Urine culture  ANTIBIOTICS: Vancomycin Zosyn  SIGNIFICANT EVENTS: January 24, 2018: Discharged January 28, 2017 admitted with cardiac and respiratory arrest  LINES/TUBES: Right radial arterial line 7/27> Left internal jugular triple-lumen catheter 7/27> ET tube 7/17>  DISCUSSION: 57 year old male presenting with acute hypoxic respiratory failure, acute CHF exacerbation and acute pulmonary edema with lung injury  ASSESSMENT / PLAN:  PULMONARY A: Acute hypoxic respiratory failure Acute pulmonary edema sleep apnea Lactic acidosis P:   Full vent support with current settings ABG post intubation reviewed and vent changes made Nebulized bronchodilators CXR and ABG prn  CARDIOVASCULAR A:  Cardiac arrest Acute systolic CHF exacerbation P:  More dynamic monitoring per ICU protocol IV Lasix 2D echo Cardiology consult Cycle cardiac enzymes Serial EKGs Hypothermia protocol  RENAL A:   Acute renal failure-creatinine 1.77; baseline 0.96 P:   Trend creatinine Monitor and correct electrolyte imbalances  GASTROINTESTINAL A:   Abdominal distention Coffee-ground emesis P:   Orogastric tube to low intermittent suction PRN Dulcolax and Senokot for constipation Abdominal x-ray Trend CBC and consult GI and transfuse if coffee-ground emesis persists or worsens Pepcid 20 mg IV every 12  HEMATOLOGIC A:   No acute issues P:  DVT prophylaxis with Lovenox  INFECTIOUS A:   Sepsis: Leukocytosis with fever- cannot rule out pneumonia P:   Empiric antibiotics as above Follow-up cultures Trend procalcitonin and adjust antibiotics Trend WBC Monitor fever curve  ENDOCRINE A:   Type 2 diabetes with hyperglycemia P:   Insulin infusion with blood glucose monitoring every hour Hemoglobin A1c with a.m. labs  NEUROLOGIC A:   Severe anoxic encephalopathy with myoclonus P:   RASS goal: 0 to  -1 Fentanyl propofol and as needed Versed for sedation and to control myoclonus Repeat CT head per neurology Neurology consult EEG Neuro assessment per protocol  FAMILY  - Updates: Sharon's wife updated at bedside.  Further changes in treatment plan pending clinical course and diagnostics.  [facility] ANP-BC Pulmonary and Critical Care Medicine [facility] Pager [phone] or [phone]  NB: This document was prepared using Dragon voice recognition software and may include unintentional dictation errors.    01/28/2018, 2:38 AM

## 2018-01-28 NOTE — ED Notes (Signed)
Patient transported to CT with RN and RT assist.

## 2018-01-28 NOTE — Progress Notes (Signed)
   01/28/18 0130  Clinical Encounter Type  Visited With Family  Visit Type Follow-up  Spiritual Encounters  Spiritual Needs Emotional   Chaplain met family in ICU waiting room; per family report, patient has not transferred yet.  ICU staff aware of family location.  Chaplain encouraged family to page chaplain as needed.

## 2018-01-28 NOTE — Progress Notes (Signed)
Pharmacy Antibiotic Note  [name] is a 57 y.o. male admitted on 01/10/2018 with sepsis.  Pharmacy has been consulted for vancomycin and cefepime dosing.  Plan: DW 85 kg  Vd 60L kei 0.051 hr-1  T1/2 14 hours Vancomycin 1250 mg q 18 hours ordered with stacked dosing. Level before 5th dose. Goal trough 15-20.  Cefepime 2 grams q 12 hours ordered     Temp (24hrs), Avg:100.1 F (37.8 C), Min:97.9 F (36.6 C), Max:101.1 F (38.4 C)  Recent Labs  Lab 01/21/18 1146 01/22/18 0112 01/22/18 0617 01/23/18 0538 01/24/18 0910 01/28/18 0034 01/28/18 0430 01/28/18 0432 01/28/18 0433  WBC  --   --   --  19.1* 17.2* 22.5*  --  33.8*  --   CREATININE  --   --  1.07 0.99 0.96 1.47*  --   --  1.77*  LATICACIDVEN  --   --   --   --   --  8.7* 2.3*  --   --   VANCOTROUGH 23*  --   --   --   --   --   --   --   --   VANCORANDOM  --  10  --   --   --   --   --   --   --     Estimated Creatinine Clearance: 55.6 mL/min (A) (by C-G formula based on SCr of 1.77 mg/dL (H)).    Allergies  Allergen Reactions  . Enalapril Maleate Anaphylaxis  . Iodinated Diagnostic Agents Itching  . Isosorbide Mononitrate Er [Isosorbide Dinitrate] Other (See Comments)    "made me not be able to think or function"  . Canagliflozin Other (See Comments)    Antimicrobials this admission: Vancomycin, cefepime 7/27  >>    >>   Dose adjustments this admission:   Microbiology results: 7/27 BCx: pending 7/27 MRSA PCR: pending      Thank you for allowing pharmacy to be a part of this patient's care.  Sami Froh S 01/28/2018 5:58 AM

## 2018-01-28 NOTE — Progress Notes (Signed)
[place] [facility] [place] NAME: [name]    MR#:  [id number]  DATE OF BIRTH:  [birth year]  SUBJECTIVE:   Patient presents to the hospital as he was found unresponsive and noted to be in pulseless cardiac arrest.  Patient received CPR and ACLS protocol for about 17 minutes prior to regain of spontaneous circulation.  Patient presently is sedated, intubated and is being cooled down under hypothermic protocol.  REVIEW OF SYSTEMS:    Review of Systems  Unable to perform ROS: Intubated    Nutrition: NPO Tolerating Diet: No Tolerating PT: Await Eval once extubated.   DRUG ALLERGIES:   Allergies  Allergen Reactions  . Enalapril Maleate Anaphylaxis  . Iodinated Diagnostic Agents Itching  . Isosorbide Mononitrate Er [Isosorbide Dinitrate] Other (See Comments)    "made me not be able to think or function"  . Canagliflozin Other (See Comments)    VITALS:  Blood pressure 120/83, pulse 61, temperature (!) 92.5 F (33.6 C), resp. rate (!) 25, weight 110.6 kg (243 lb 13.3 oz), SpO2 96 %.  PHYSICAL EXAMINATION:   Physical Exam  GENERAL:  57 y.o.-year-old patient lying in bed sedated & Intubated.  EYES: PERRL but minimally responsive.  HEENT: Head atraumatic, normocephalic. ET and OG tubes in place.   NECK:  Supple, no jugular venous distention. No thyroid enlargement, no tenderness.  LUNGS: Normal breath sounds bilaterally, no wheezing, rales, rhonchi. No use of accessory muscles of respiration.  CARDIOVASCULAR: S1, S2 normal. No murmurs, rubs, or gallops.  ABDOMEN: Soft, nontender, nondistended. Bowel sounds present. No organomegaly or mass.  EXTREMITIES: No cyanosis, clubbing or edema b/l.    NEUROLOGIC: Sedated & Intubated  PSYCHIATRIC: Sedated & Intubated.   SKIN: No obvious rash, lesion, or ulcer.    LABORATORY PANEL:   CBC Recent Labs  Lab 01/28/18 0432  WBC 33.8*  HGB 11.9*  HCT 34.5*  PLT 268    ------------------------------------------------------------------------------------------------------------------  Chemistries  Recent Labs  Lab 01/28/18 0433  01/28/18 1245 01/28/18 1440  NA 141   < > 141 140  K 4.7   < > 4.4 3.9  CL 105   < > 108 109  CO2 27   < > 23 21*  GLUCOSE 333*   < > 340* 313*  BUN 23*   < > 26* 28*  CREATININE 1.77*   < > 2.12* 2.04*  CALCIUM 9.1   < > 8.4* 8.2*  MG  --   --  2.1  --   AST 59*  --   --   --   ALT 36  --   --   --   ALKPHOS 54  --   --   --   BILITOT 1.0  --   --   --    < > = values in this interval not displayed.   ------------------------------------------------------------------------------------------------------------------  Cardiac Enzymes Recent Labs  Lab 01/28/18 1440  TROPONINI 1.17*   ------------------------------------------------------------------------------------------------------------------  RADIOLOGY:  Dg Abd 1 View  Result Date: 01/28/2018 CLINICAL DATA:  NG tube placement EXAM: ABDOMEN - 1 VIEW COMPARISON:  01/20/2018 FINDINGS: No enteric tube is demonstrated within the field of view. Mild gaseous distention of the stomach. IMPRESSION: No enteric tube visualized. Electronically Signed   By: Lucienne Capers M.D.   On: 01/28/2018 05:53   Ct Head Wo Contrast  Result Date: 01/28/2018 CLINICAL DATA:  Cardiac arrest. History of hypertension and diabetes. EXAM: CT HEAD WITHOUT CONTRAST TECHNIQUE: Contiguous axial  images were obtained from the base of the skull through the vertex without intravenous contrast. COMPARISON:  CT neck January 20, 2018. FINDINGS: BRAIN: No intraparenchymal hemorrhage, mass effect nor midline shift. Mild blurring of the gray-white matter differentiation. Patchy supratentorial white matter hypodensities. The ventricles and sulci are normal. No acute large vascular territory infarcts. No abnormal extra-axial fluid collections. Basal cisterns are patent. VASCULAR: Mild calcific atherosclerosis  carotid bifurcations. SKULL/SOFT TISSUES: No skull fracture. No significant soft tissue swelling. ORBITS/SINUSES: The included ocular globes and orbital contents are normal.Mild paranasal sinus mucosal thickening without air-fluid levels. Included mastoid air cells are well aerated. OTHER: Life-support lines in place. IMPRESSION: 1. 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 2. Mild chronic small vessel ischemic changes. 3. Mild atherosclerosis. 4. Acute findings discussed with and reconfirmed by Dr.ALLISON WEBSTER on 01/28/2018 at 1:00 am. Electronically Signed   By: Elon Alas M.D.   On: 01/28/2018 01:04   Dg Chest Port 1 View  Result Date: 01/28/2018 CLINICAL DATA:  Line placement EXAM: PORTABLE CHEST 1 VIEW COMPARISON:  01/28/2018 FINDINGS: Endotracheal tube measures 6.8 cm above the carina. A left central venous catheter has been placed with tip over the confluence of the brachiocephalic vein and IVC. Shallow inspiration. Cardiac enlargement. Bilateral perihilar infiltrates. Probable small right pleural effusion. No pneumothorax is visible. IMPRESSION: Appliances appear in satisfactory position. Cardiac enlargement with bilateral perihilar infiltrates. Small right pleural effusion. No pneumothorax. Electronically Signed   By: Lucienne Capers M.D.   On: 01/28/2018 05:52   Dg Chest Portable 1 View  Result Date: 01/28/2018 CLINICAL DATA:  Short of breath EXAM: PORTABLE CHEST 1 VIEW COMPARISON:  01/24/2018, 01/21/2018 FINDINGS: Interval intubation, tip of the endotracheal tube is about 4 cm superior to the carina. Worsening bilateral interstitial and alveolar disease. No large effusion. Stable mild cardiomegaly. No pneumothorax. IMPRESSION: 1. Endotracheal tube tip about 3.5 cm superior to the carina. 2. Cardiomegaly. Interim worsening of bilateral interstitial and alveolar airspace disease. Electronically Signed   By: Donavan Foil M.D.   On: 01/28/2018 00:29      ASSESSMENT AND PLAN:   57 year old male with past medical history of ischemic cardiomyopathy ejection fraction of 20 to 02%, chronic systolic CHF, struct of sleep apnea, hypertension, diabetes who presented to the hospital as he was found unresponsive and noted to be in pulseless cardiac arrest.  1.  Pulseless cardiac arrest-as a result of worsening respiratory failure and flash pulmonary edema. - Patient was resuscitated in the ER for about 17 minutes prior to regaining spontaneous circulation. - Continue hypothermic protocol as per intensivist.  2.  Acute kidney injury with oliguria-secondary to underlying cardiac arrest and cardiorenal hemodynamics. -Continue IV fluids, Levophed, vasopressin.  Follow BUN and creatinine and urine output. -If not improving consider starting on CRRT.  3.  Altered mental status/encephalopathy-suspected to be secondary to underlying anoxic brain injury. - Neurology was consulted, they explained to the patient's wife about patient's poor prognosis and unlikely that he would recover from this.  EEG pending, consider repeating CT head.  Patient having occasional myoclonic jerks but showing no evidence of meaningful neurologic recovery no brainstem function as per neurology.  4.  Sepsis/septic shock-suspected but source remains unclear.  Continue empiric vancomycin, cefepime. -Continue vasopressors, follow cultures.  5.  History of ischemic cardiomyopathy with ejection fraction of 20 to 25%- patient does not appear to be volume overloaded presently.  Continue diuresis as needed.  6.  Leukocytosis-secondary to suspected sepsis.  Follow with IV antibiotic therapy.  Patient has a very poor prognosis given his multiple comorbidities and now with multiorgan failure.  Consider palliative care consult to discuss goals of care.   All the records are reviewed and case discussed with Care Management/Social Worker. Management plans discussed with the patient,  family and they are in agreement.  CODE STATUS: Full code  DVT Prophylaxis: Lovenox  TOTAL TIME TAKING CARE OF THIS PATIENT: 30 minutes.   POSSIBLE D/C IN unclear, DEPENDING ON CLINICAL CONDITION and course   Henreitta Leber M.D on 01/28/2018 at 3:26 PM  Between 7am to 6pm - Pager - [phone]  After 6pm go to [email or web address] - password [email or web address] [place]  Office  [phone]  CC: Primary care physician; Marguerita Merles, MD

## 2018-01-28 NOTE — Progress Notes (Signed)
0320: Ms. Tyler Mane, NP began arterial line procedure- pt. VSS, will assist & monitor as needed

## 2018-01-28 NOTE — Progress Notes (Signed)
[facility] 3L placed, VSS, no complications, will continue to monitor pt. closely

## 2018-01-28 NOTE — Procedures (Signed)
Central Venous Catheter Insertion Procedure Note CYPRIAN GONGAWARE [id number] 1961-03-15  Procedure: Insertion of Central Venous Catheter Indications: Assessment of intravascular volume, Drug and/or fluid administration and Frequent blood sampling  Procedure Details Consent: Unable to obtain consent because of emergent medical necessity. Time Out: Verified patient identification, verified procedure, site/side was marked, verified correct patient position, special equipment/implants available, medications/allergies/relevent history reviewed, required imaging and test results available.  Performed  Maximum sterile technique was used including antiseptics, cap, gloves, gown, hand hygiene, mask and sheet. Skin prep: Chlorhexidine; local anesthetic administered A antimicrobial bonded/coated triple lumen catheter was placed in the left internal jugular vein using the Seldinger technique.  Evaluation Blood flow good Complications: No apparent complications Patient did tolerate procedure well. Chest X-ray ordered to verify placement.  CXR: normal.  Procedure performed under direct supervision of Dr.Conforti. Ultrasound utilized for realtime vessel cannulation  Cynthis Purington S. [facility] ANP-BC Pulmonary and Critical Care Medicine [facility] Pager [phone] or [phone]  NB: This document was prepared using Dragon voice recognition software and may include unintentional dictation errors.    01/28/2018, 5:30 AM

## 2018-01-28 NOTE — Progress Notes (Signed)
   01/28/18 0005  Clinical Encounter Type  Visited With Patient not available;Health care provider;Family  Visit Type Initial  Spiritual Encounters  Spiritual Needs Prayer;Emotional   Received page then call from operator requesting chaplain to ED16.  Chaplain arrived to find team caring for patient; chaplain maintained pastoral presence and offered silent and energetic prayer for patient and care team.  Staff notified chaplain of family presence.  Chaplain met family in ED lobby and walked with them to the family room.  Chaplain maintained a calm, non-anxious presence while utilizing active and reflective listening to offer emotional support to patient's spouse, son, and daughter-in-law.  Chaplain present during doctor's update to family.  Family went to move their cars and chaplain alerted ICU unit clerk that family would be in the waiting room.  Chaplain to meet family in the waiting room.

## 2018-01-28 NOTE — ED Triage Notes (Signed)
Pt arrives via private vehicle in cardiac arrest. Pt to room 16 with cpr in progress.

## 2018-01-28 NOTE — Consult Note (Addendum)
Reason for Consult: cardiac arrest prognosis   Referring Physician: Dr. Jefferson Fuel   CC: cardia carrest   HPI: [name] is an 57 y.o. male that was recently hospitalized with hypercapnic respiratory arrest.  Was discharged several days ago.  Patient's wife noted him to be progressively shortness of breath and brought him to the emergency room.  On arrival to the emergency room the patient was unresponsive and pulseless.  He had no respirations.  CPR was initiated and continued for approximately 17 minutes in the emergency room. Currently on hypothermia protocol and multiple pressors.     Past Medical History:  Diagnosis Date  . Chest pain 2019  . Diabetes mellitus without complication ([place])   . Hypertension   . Sleep apnea     Past Surgical History:  Procedure Laterality Date  . COLONOSCOPY  2017  . LIPOMA EXCISION     Left forehead    Family History  Problem Relation Age of Onset  . Diabetes Father   . Colon cancer Neg Hx     Social History:  reports that he has been smoking cigarettes.  He has a 9.60 pack-year smoking history. He has never used smokeless tobacco. He reports that he does not drink alcohol or use drugs.  Allergies  Allergen Reactions  . Enalapril Maleate Anaphylaxis  . Iodinated Diagnostic Agents Itching  . Isosorbide Mononitrate Er [Isosorbide Dinitrate] Other (See Comments)    "made me not be able to think or function"  . Canagliflozin Other (See Comments)    Medications: I have reviewed the patient's current medications.  ROS: Unable to obtain due to current condition   Physical Examination: Blood pressure 108/80, pulse (!) 47, temperature (!) 90.1 F (32.3 C), resp. rate (!) 25, weight 243 lb 13.3 oz (110.6 kg), SpO2 94 %.  Pupils 2 non reactive No cough, gag No pupils or withdrawal of painful stimuli.    Laboratory Studies:   Basic Metabolic Panel: Recent Labs  Lab 01/22/18 0617 01/23/18 2694 01/24/18 0910 01/28/18 0034  01/28/18 0432 01/28/18 0433 01/28/18 0816 01/28/18 1100  NA 139 140 138 144  --  141 140 140  K 3.5 3.5 3.6 3.7  --  4.7 4.5 4.5  CL 108 108 105 107  --  105 107 108  CO2 24 24 28 23   --  27 24 23   GLUCOSE 163* 146* 183* 317*  --  333* 383* 374*  BUN 28* 21* 18 14  --  23* 24* 26*  CREATININE 1.07 0.99 0.96 1.47*  --  1.77* 1.86* 1.89*  CALCIUM 8.2* 8.2* 8.5* 9.8  --  9.1 8.5* 8.6*  MG 1.9 2.0  --   --  2.2  --   --   --   PHOS  --   --   --   --  8.2*  --   --   --     Liver Function Tests: Recent Labs  Lab 01/28/18 0034 01/28/18 0433  AST 58* 59*  ALT 31 36  ALKPHOS 49 54  BILITOT 0.5 1.0  PROT 5.8* 6.7  ALBUMIN 2.2* 2.6*   No results for input(s): LIPASE, AMYLASE in the last 168 hours. No results for input(s): AMMONIA in the last 168 hours.  CBC: Recent Labs  Lab 01/23/18 0538 01/24/18 0910 01/28/18 0034 01/28/18 0432  WBC 19.1* 17.2* 22.5* 33.8*  NEUTROABS 12.7* 10.6*  --   --   HGB 11.7* 12.1* 10.3* 11.9*  HCT 34.7* 36.0* 30.9* 34.5*  MCV 93.1 93.4 96.2 93.5  PLT 204 233 288 268    Cardiac Enzymes: Recent Labs  Lab 01/28/18 0034 01/28/18 0433 01/28/18 0816  TROPONINI 0.07* 0.50* 0.90*    BNP: Invalid input(s): POCBNP  CBG: Recent Labs  Lab 01/28/18 0004 01/28/18 0620 01/28/18 0742 01/28/18 0745 01/28/18 0841  GLUCAP 265* 354* 355* 347* 343*    Microbiology: Results for orders placed or performed during the hospital encounter of 01/08/2018  MRSA PCR Screening     Status: None   Collection Time: 01/28/18  5:00 AM  Result Value Ref Range Status   MRSA by PCR NEGATIVE NEGATIVE Final    Comment:        The GeneXpert MRSA Assay (FDA approved for NASAL specimens only), is one component of a comprehensive MRSA colonization surveillance program. It is not intended to diagnose MRSA infection nor to guide or monitor treatment for MRSA infections. Performed at [facility], [address] [place],  [address]      Coagulation Studies: Recent Labs    01/28/18 0433 01/28/18 1145  LABPROT 16.0* 16.7*  INR 1.29 1.36    Urinalysis: No results for input(s): COLORURINE, LABSPEC, PHURINE, GLUCOSEU, HGBUR, BILIRUBINUR, KETONESUR, PROTEINUR, UROBILINOGEN, NITRITE, LEUKOCYTESUR in the last 168 hours.  Invalid input(s): APPERANCEUR  Lipid Panel:     Component Value Date/Time   CHOL 191 01/20/2018 0459   TRIG 222 (H) 01/28/2018 0433   HDL NOT REPORTED DUE TO HIGH TRIGLYCERIDES 01/20/2018 0459   CHOLHDL NOT REPORTED DUE TO HIGH TRIGLYCERIDES 01/20/2018 0459   VLDL UNABLE TO CALCULATE IF TRIGLYCERIDE OVER 400 mg/dL 01/20/2018 0459   LDLCALC UNABLE TO CALCULATE IF TRIGLYCERIDE OVER 400 mg/dL 01/20/2018 0459    HgbA1C:  Lab Results  Component Value Date   HGBA1C 13.5 (H) 12/10/2013    Urine Drug Screen:  No results found for: LABOPIA, COCAINSCRNUR, LABBENZ, AMPHETMU, THCU, LABBARB  Alcohol Level: No results for input(s): ETH in the last 168 hours.   Imaging: Dg Abd 1 View  Result Date: 01/28/2018 CLINICAL DATA:  NG tube placement EXAM: ABDOMEN - 1 VIEW COMPARISON:  01/20/2018 FINDINGS: No enteric tube is demonstrated within the field of view. Mild gaseous distention of the stomach. IMPRESSION: No enteric tube visualized. Electronically Signed   By: Lucienne Capers M.D.   On: 01/28/2018 05:53   Ct Head Wo Contrast  Result Date: 01/28/2018 CLINICAL DATA:  Cardiac arrest. History of hypertension and diabetes. EXAM: CT HEAD WITHOUT CONTRAST TECHNIQUE: Contiguous axial images were obtained from the base of the skull through the vertex without intravenous contrast. COMPARISON:  CT neck January 20, 2018. FINDINGS: BRAIN: No intraparenchymal hemorrhage, mass effect nor midline shift. Mild blurring of the gray-white matter differentiation. Patchy supratentorial white matter hypodensities. The ventricles and sulci are normal. No acute large vascular territory infarcts. No abnormal extra-axial fluid  collections. Basal cisterns are patent. VASCULAR: Mild calcific atherosclerosis carotid bifurcations. SKULL/SOFT TISSUES: No skull fracture. No significant soft tissue swelling. ORBITS/SINUSES: The included ocular globes and orbital contents are normal.Mild paranasal sinus mucosal thickening without air-fluid levels. Included mastoid air cells are well aerated. OTHER: Life-support lines in place. IMPRESSION: 1. 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 2. Mild chronic small vessel ischemic changes. 3. Mild atherosclerosis. 4. Acute findings discussed with and reconfirmed by Dr.ALLISON WEBSTER on 01/28/2018 at 1:00 am. Electronically Signed   By: Elon Alas M.D.   On: 01/28/2018 01:04   Dg Chest Port 1 View  Result Date: 01/28/2018 CLINICAL DATA:  Line placement EXAM: PORTABLE CHEST 1 VIEW COMPARISON:  01/28/2018 FINDINGS: Endotracheal tube measures 6.8 cm above the carina. A left central venous catheter has been placed with tip over the confluence of the brachiocephalic vein and IVC. Shallow inspiration. Cardiac enlargement. Bilateral perihilar infiltrates. Probable small right pleural effusion. No pneumothorax is visible. IMPRESSION: Appliances appear in satisfactory position. Cardiac enlargement with bilateral perihilar infiltrates. Small right pleural effusion. No pneumothorax. Electronically Signed   By: Lucienne Capers M.D.   On: 01/28/2018 05:52   Dg Chest Portable 1 View  Result Date: 01/28/2018 CLINICAL DATA:  Short of breath EXAM: PORTABLE CHEST 1 VIEW COMPARISON:  01/24/2018, 01/21/2018 FINDINGS: Interval intubation, tip of the endotracheal tube is about 4 cm superior to the carina. Worsening bilateral interstitial and alveolar disease. No large effusion. Stable mild cardiomegaly. No pneumothorax. IMPRESSION: 1. Endotracheal tube tip about 3.5 cm superior to the carina. 2. Cardiomegaly. Interim worsening of bilateral interstitial and alveolar airspace disease.  Electronically Signed   By: Donavan Foil M.D.   On: 01/28/2018 00:29     Assessment/Plan:  57 y.o. male that was recently hospitalized with hypercapnic respiratory arrest.  Was discharged several days ago.  Patient's wife noted him to be progressively shortness of breath and brought him to the emergency room.  On arrival to the emergency room the patient was unresponsive and pulseless.  He had no respirations.  CPR was initiated and continued for approximately 17 minutes in the emergency room. Currently on hypothermia protocol and multiple pressors.  - CTH day 1 signs of anoxia which is a very poor prognosticator in its own - Currently no brainstem reflexes but pt is sedated - On hypothermia protocol - At this time give the very poor heart failure, imaging, down time can state the prognosis is grim for any recovery - If family wants to con't aggressive therapy would repeat CTH after warming as likely will be enough to see the progression of anoxia.   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

## 2018-01-28 NOTE — Progress Notes (Signed)
Pt with minor twitching when propofol was paused, not purposeful, twitching subsided when propofol was restarted at 20 mcg, no cough/ gag or corneal reflexes noted on assessment or with deep suctioning, pupils remain 2, nonreactive, fixed.   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  30 ccs urine output this shift, MD aware.  We will hold off on replacing potassium at this time d/t renal function; potassium has decreased from 4.7 to 3.9 this shift.  Minimal pressor requirement at this time.  FIO2 has been decreased this shift from 80% to 50%.

## 2018-01-28 NOTE — Procedures (Signed)
Arterial Catheter Insertion Procedure Note [name] [id number] [birth year]  Procedure: Insertion of Arterial Catheter  Indications: Blood pressure monitoring and Frequent blood sampling  Procedure Details Consent: Unable to obtain consent because of emergent medical necessity. Time Out: Verified patient identification, verified procedure, site/side was marked, verified correct patient position, special equipment/implants available, medications/allergies/relevent history reviewed, required imaging and test results available.  Performed  Maximum sterile technique was used including antiseptics, cap, gloves, gown, hand hygiene, mask and sheet. Skin prep: Chlorhexidine; local anesthetic administered 20 gauge catheter was inserted into right radial artery using the Seldinger technique. ULTRASOUND GUIDANCE USED: YES Evaluation Blood flow good; BP tracing good. Complications: No apparent complications.  Procedure performed under direct supervision of Dr.Conforti. Ultrasound utilized for realtime vessel cannulation Brentin Shin S. [facility] ANP-BC Pulmonary and Critical Care Medicine [facility] Pager [phone] or [phone]  NB: This document was prepared using Dragon voice recognition software and may include unintentional dictation errors.     01/28/2018

## 2018-01-28 NOTE — Progress Notes (Signed)
Pt. Has R radial A-line, VSS, no complications

## 2018-01-28 NOTE — ED Notes (Signed)
Pt back to room at this time from ct , placed on monitor

## 2018-01-28 NOTE — Progress Notes (Signed)
33 degrees C temp target achieved at 0808.  BMet drawn and sent to lab.  Vaso at .03  added per Dr Jefferson Fuel.  Pt with no gag/cough reflex at this time

## 2018-01-28 NOTE — ED Notes (Addendum)
CODE NARRATOR AS FOLLOWS:   2350, arrived out of car to room 16, CPR in progress, no pulse, defib pads placed 2352- IO to right and left tibia placed by dr. Mable Paris, IOs flushing well  2353- one amp of EPI to LIO 2354- one amp of atropine to RIO 2354- one amp of Calcium Chloride given LIO 2354- one amp of BICARB given LIO 2355- 2gm MAG given RIO 2357- pt intubated with 7.0 ETT by dr. Dahlia Client, confirmation with end tital CO2. Tube 24 at lip. Pulse check, no pulse PEA on monitor.CPR resumed.  2358- One amp EPI given RIO 0002- 50mg  ALTEPLASE given RIO 0003- One amp EPI given RIO 0004- pulse check, no pulse PEA on monitor, CPR resumed.  0005- One amp of BICARB given RIO 0007- One amp of CALCIUM CHLORIDE given RIO 0007- pulse check, pulse noted, sinus tachycardia

## 2018-01-28 NOTE — Plan of Care (Signed)
Pt. Has stopped having myoclonic jerking on sedation and pain gtt (see MAR for details) Pt.'s VSS with levophed running TTM started at 0245 to 33 degrees. UOP minimal with a total of 130 mL since admission, MD/NP aware.  Multiple attempts by multiple staff to place OGT/NGT without success. CDS called, see post mortem flowsheet for info Report given to [facility]

## 2018-01-28 NOTE — ED Notes (Signed)
Wife entered ED waiting room and stated she needed help getting her husband out of the car as he was unresponsive. Arrived to car to find patient laying back in the car seat with his head to the side and pt appeared blue around his lips. No pulse or respirations noted. Other staff arrived with stretcher and pt pulled out of the car and onto stretcher and chest compressions began. Pt taken to Rm 16. Wife reports pt became unresponsive while she was driving him to the ED.

## 2018-01-29 ENCOUNTER — Other Ambulatory Visit: Payer: Self-pay

## 2018-01-29 LAB — PROCALCITONIN: Procalcitonin: 18.39 ng/mL

## 2018-01-29 LAB — BASIC METABOLIC PANEL
Anion gap: 10 (ref 5–15)
Anion gap: 8 (ref 5–15)
Anion gap: 12 (ref 5–15)
Anion gap: 11 (ref 5–15)
Anion gap: 13 (ref 5–15)
BUN: 33 mg/dL — AB (ref 6–20)
BUN: 36 mg/dL — ABNORMAL HIGH (ref 6–20)
BUN: 37 mg/dL — ABNORMAL HIGH (ref 6–20)
BUN: 40 mg/dL — ABNORMAL HIGH (ref 6–20)
BUN: 39 mg/dL — ABNORMAL HIGH (ref 6–20)
CALCIUM: 7.8 mg/dL — AB (ref 8.9–10.3)
CALCIUM: 7.1 mg/dL — AB (ref 8.9–10.3)
CHLORIDE: 108 mmol/L (ref 98–111)
CHLORIDE: 108 mmol/L (ref 98–111)
CHLORIDE: 107 mmol/L (ref 98–111)
CHLORIDE: 106 mmol/L (ref 98–111)
CO2: 20 mmol/L — ABNORMAL LOW (ref 22–32)
CO2: 21 mmol/L — ABNORMAL LOW (ref 22–32)
CO2: 20 mmol/L — ABNORMAL LOW (ref 22–32)
CO2: 18 mmol/L — ABNORMAL LOW (ref 22–32)
CO2: 17 mmol/L — ABNORMAL LOW (ref 22–32)
CREATININE: 3.09 mg/dL — AB (ref 0.61–1.24)
Calcium: 7.4 mg/dL — ABNORMAL LOW (ref 8.9–10.3)
Calcium: 7.6 mg/dL — ABNORMAL LOW (ref 8.9–10.3)
Calcium: 7.1 mg/dL — ABNORMAL LOW (ref 8.9–10.3)
Chloride: 109 mmol/L (ref 98–111)
Creatinine, Ser: 2.39 mg/dL — ABNORMAL HIGH (ref 0.61–1.24)
Creatinine, Ser: 2.7 mg/dL — ABNORMAL HIGH (ref 0.61–1.24)
Creatinine, Ser: 2.76 mg/dL — ABNORMAL HIGH (ref 0.61–1.24)
Creatinine, Ser: 3.45 mg/dL — ABNORMAL HIGH (ref 0.61–1.24)
GFR calc Af Amer: 33 mL/min — ABNORMAL LOW (ref >60)
GFR, EST AFRICAN AMERICAN: 28 mL/min — AB (ref >60)
GFR, EST AFRICAN AMERICAN: 28 mL/min — AB (ref >60)
GFR, EST AFRICAN AMERICAN: 24 mL/min — AB (ref >60)
GFR, EST AFRICAN AMERICAN: 21 mL/min — AB (ref >60)
GFR, EST NON AFRICAN AMERICAN: 28 mL/min — AB (ref >60)
GFR, EST NON AFRICAN AMERICAN: 25 mL/min — AB (ref >60)
GFR, EST NON AFRICAN AMERICAN: 24 mL/min — AB (ref >60)
GFR, EST NON AFRICAN AMERICAN: 21 mL/min — AB (ref >60)
GFR, EST NON AFRICAN AMERICAN: 18 mL/min — AB (ref >60)
GLUCOSE: 167 mg/dL — AB (ref 70–99)
Glucose, Bld: 122 mg/dL — ABNORMAL HIGH (ref 70–99)
Glucose, Bld: 187 mg/dL — ABNORMAL HIGH (ref 70–99)
Glucose, Bld: 223 mg/dL — ABNORMAL HIGH (ref 70–99)
Glucose, Bld: 249 mg/dL — ABNORMAL HIGH (ref 70–99)
POTASSIUM: 4.3 mmol/L (ref 3.5–5.1)
POTASSIUM: 4.3 mmol/L (ref 3.5–5.1)
Potassium: 3.1 mmol/L — ABNORMAL LOW (ref 3.5–5.1)
Potassium: 5 mmol/L (ref 3.5–5.1)
Potassium: 5.9 mmol/L — ABNORMAL HIGH (ref 3.5–5.1)
SODIUM: 139 mmol/L (ref 135–145)
SODIUM: 137 mmol/L (ref 135–145)
SODIUM: 140 mmol/L (ref 135–145)
SODIUM: 136 mmol/L (ref 135–145)
SODIUM: 136 mmol/L (ref 135–145)

## 2018-01-29 LAB — PHOSPHORUS: Phosphorus: 5.5 mg/dL — ABNORMAL HIGH (ref 2.5–4.6)

## 2018-01-29 LAB — URINE CULTURE: Culture: NO GROWTH

## 2018-01-29 LAB — MAGNESIUM: Magnesium: 2 mg/dL (ref 1.7–2.4)

## 2018-01-29 LAB — BLOOD GAS, ARTERIAL
Acid-base deficit: 8.5 mmol/L — ABNORMAL HIGH (ref 0.0–2.0)
Bicarbonate: 18.8 mmol/L — ABNORMAL LOW (ref 20.0–28.0)
FIO2: 50
MECHANICAL RATE: 25
O2 Saturation: 93.6 %
PEEP/CPAP: 10 cmH2O
PH ART: 7.28 — AB (ref 7.350–7.450)
Patient temperature: 32.8
VT: 500 mL
pCO2 arterial: 46 mmHg (ref 32.0–48.0)
pO2, Arterial: 63 mmHg — ABNORMAL LOW (ref 83.0–108.0)

## 2018-01-29 LAB — LACTIC ACID, PLASMA: Lactic Acid, Venous: 3.5 mmol/L (ref 0.5–1.9)

## 2018-01-29 LAB — GLUCOSE, CAPILLARY
GLUCOSE-CAPILLARY: 115 mg/dL — AB (ref 70–99)
GLUCOSE-CAPILLARY: 173 mg/dL — AB (ref 70–99)
GLUCOSE-CAPILLARY: 238 mg/dL — AB (ref 70–99)
Glucose-Capillary: 115 mg/dL — ABNORMAL HIGH (ref 70–99)
Glucose-Capillary: 137 mg/dL — ABNORMAL HIGH (ref 70–99)
Glucose-Capillary: 143 mg/dL — ABNORMAL HIGH (ref 70–99)
Glucose-Capillary: 159 mg/dL — ABNORMAL HIGH (ref 70–99)
Glucose-Capillary: 176 mg/dL — ABNORMAL HIGH (ref 70–99)
Glucose-Capillary: 208 mg/dL — ABNORMAL HIGH (ref 70–99)

## 2018-01-29 LAB — ECHOCARDIOGRAM LIMITED: Weight: 3901.26 oz

## 2018-01-29 LAB — CBC
HCT: 30.1 % — ABNORMAL LOW (ref 40.0–52.0)
HEMOGLOBIN: 10.3 g/dL — AB (ref 13.0–18.0)
MCH: 31.5 pg (ref 26.0–34.0)
MCHC: 34.2 g/dL (ref 32.0–36.0)
MCV: 92.2 fL (ref 80.0–100.0)
PLATELETS: 113 10*3/uL — AB (ref 150–440)
RBC: 3.27 MIL/uL — AB (ref 4.40–5.90)
RDW: 13.1 % (ref 11.5–14.5)
WBC: 25.9 10*3/uL — AB (ref 3.8–10.6)

## 2018-01-29 LAB — HEMOGLOBIN A1C
Hgb A1c MFr Bld: 8.6 % — ABNORMAL HIGH (ref 4.8–5.6)
MEAN PLASMA GLUCOSE: 200.12 mg/dL

## 2018-01-29 LAB — POTASSIUM: Potassium: 5.7 mmol/L — ABNORMAL HIGH (ref 3.5–5.1)

## 2018-01-29 LAB — TROPONIN I: TROPONIN I: 1.35 ng/mL — AB (ref <0.03)

## 2018-01-29 MED ORDER — SODIUM CHLORIDE 0.9 % IV SOLN
1.0000 g | Freq: Once | INTRAVENOUS | Status: AC
  Administered 2018-01-29: 1 g via INTRAVENOUS
  Filled 2018-01-29: qty 10

## 2018-01-29 MED ORDER — SODIUM POLYSTYRENE SULFONATE 15 GM/60ML PO SUSP
15.0000 g | Freq: Once | ORAL | Status: AC
  Administered 2018-01-29: 15 g via RECTAL
  Filled 2018-01-29: qty 60

## 2018-01-29 MED ORDER — VALPROATE SODIUM 500 MG/5ML IV SOLN
1200.0000 mg | Freq: Once | INTRAVENOUS | Status: AC
  Administered 2018-01-29: 1200 mg via INTRAVENOUS
  Filled 2018-01-29: qty 12

## 2018-01-29 MED ORDER — POTASSIUM CHLORIDE 10 MEQ/100ML IV SOLN
10.0000 meq | INTRAVENOUS | Status: AC
  Administered 2018-01-29 (×4): 10 meq via INTRAVENOUS
  Filled 2018-01-29 (×4): qty 100

## 2018-01-29 MED ORDER — SODIUM CHLORIDE 0.9 % IV BOLUS
1000.0000 mL | Freq: Once | INTRAVENOUS | Status: AC
  Administered 2018-01-29: 1000 mL via INTRAVENOUS

## 2018-01-29 MED ORDER — POTASSIUM CHLORIDE 10 MEQ/50ML IV SOLN
10.0000 meq | INTRAVENOUS | Status: DC
  Filled 2018-01-29 (×4): qty 50

## 2018-01-29 MED ORDER — INSULIN GLARGINE 100 UNIT/ML ~~LOC~~ SOLN: 5.0000 [IU] | Freq: Every day | SUBCUTANEOUS | Status: DC

## 2018-01-29 MED ORDER — STERILE WATER FOR INJECTION IV SOLN
INTRAVENOUS | Status: DC
  Administered 2018-01-29 – 2018-01-30 (×3): via INTRAVENOUS
  Filled 2018-01-29 (×3): qty 850

## 2018-01-29 MED ORDER — INSULIN GLARGINE 100 UNIT/ML ~~LOC~~ SOLN
5.0000 [IU] | Freq: Every day | SUBCUTANEOUS | Status: DC
  Administered 2018-01-29 – 2018-02-08 (×11): 5 [IU] via SUBCUTANEOUS
  Filled 2018-01-29 (×13): qty 0.05

## 2018-01-29 MED ORDER — DEXTROSE 50 % IV SOLN
50.0000 mL | Freq: Once | INTRAVENOUS | Status: AC
  Administered 2018-01-29: 50 mL via INTRAVENOUS
  Filled 2018-01-29: qty 50

## 2018-01-29 MED ORDER — SODIUM CHLORIDE 0.9 % IV SOLN
1250.0000 mg | INTRAVENOUS | Status: DC
  Filled 2018-01-29: qty 1250

## 2018-01-29 MED ORDER — INSULIN ASPART 100 UNIT/ML ~~LOC~~ SOLN
0.0000 [IU] | SUBCUTANEOUS | Status: DC
  Administered 2018-01-29 (×2): 5 [IU] via SUBCUTANEOUS
  Administered 2018-01-29: 3 [IU] via SUBCUTANEOUS
  Administered 2018-01-30: 5 [IU] via SUBCUTANEOUS
  Administered 2018-01-30: 8 [IU] via SUBCUTANEOUS
  Filled 2018-01-29 (×5): qty 1

## 2018-01-29 MED ORDER — SODIUM CHLORIDE 0.9 % IV BOLUS
250.0000 mL | Freq: Once | INTRAVENOUS | Status: AC
  Administered 2018-01-29: 250 mL via INTRAVENOUS

## 2018-01-29 MED ORDER — SODIUM CHLORIDE 0.9 % IV BOLUS
200.0000 mL | Freq: Once | INTRAVENOUS | Status: AC
  Administered 2018-01-29: 200 mL via INTRAVENOUS

## 2018-01-29 MED ORDER — INSULIN REGULAR HUMAN 100 UNIT/ML IJ SOLN
10.0000 [IU] | Freq: Once | INTRAMUSCULAR | Status: AC
  Administered 2018-01-29: 10 [IU] via INTRAVENOUS
  Filled 2018-01-29: qty 0.1

## 2018-01-29 NOTE — Progress Notes (Signed)
On hypothermia protocol and sedated.   Past Medical History:  Diagnosis Date  . Chest pain 2019  . Diabetes mellitus without complication ([place])   . Hypertension   . Sleep apnea     Past Surgical History:  Procedure Laterality Date  . COLONOSCOPY  2017  . LIPOMA EXCISION     Left forehead    Family History  Problem Relation Age of Onset  . Diabetes Father   . Colon cancer Neg Hx     Social History:  reports that he has been smoking cigarettes.  He has a 9.60 pack-year smoking history. He has never used smokeless tobacco. He reports that he does not drink alcohol or use drugs.  Allergies  Allergen Reactions  . Enalapril Maleate Anaphylaxis  . Iodinated Diagnostic Agents Itching  . Isosorbide Mononitrate Er [Isosorbide Dinitrate] Other (See Comments)    "made me not be able to think or function"  . Canagliflozin Other (See Comments)    Medications: I have reviewed the patient's current medications.    Physical Examination: Blood pressure 104/75, pulse 76, temperature (!) 94.1 F (34.5 C), resp. rate (!) 27, height 6\' 1"  (1.854 m), weight 256 lb 2.8 oz (116.2 kg), SpO2 94 %.  Pupils 2 non reactive No cough, gag No pupils or withdrawal of painful stimuli.    Laboratory Studies:   Basic Metabolic Panel: Recent Labs  Lab 01/23/18 0538  01/28/18 0432  01/28/18 1245  01/28/18 1756 01/28/18 2151 01/29/18 0210 01/29/18 0756 01/29/18 1152  NA 140   < >  --    < > 141   < > 139 139 139 137 140  K 3.5   < >  --    < > 4.4   < > 3.7 3.4* 3.1* 4.3 4.3  CL 108   < >  --    < > 108   < > 108 110 109 108 108  CO2 24   < >  --    < > 23   < > 21* 21* 20* 21* 20*  GLUCOSE 146*   < >  --    < > 340*   < > 262* 213* 167* 122* 187*  BUN 21*   < >  --    < > 26*   < > 29* 33* 33* 36* 37*  CREATININE 0.99   < >  --    < > 2.12*   < > 2.11* 2.33* 2.39* 2.70* 2.76*  CALCIUM 8.2*   < >  --    < > 8.4*   < > 8.2* 8.1* 7.8* 7.4* 7.6*  MG 2.0  --  2.2  --  2.1  --   --   --  2.0  --    --   PHOS  --   --  8.2*  --   --   --   --   --  5.5*  --   --    < > = values in this interval not displayed.    Liver Function Tests: Recent Labs  Lab 01/28/18 0034 01/28/18 0433  AST 58* 59*  ALT 31 36  ALKPHOS 49 54  BILITOT 0.5 1.0  PROT 5.8* 6.7  ALBUMIN 2.2* 2.6*   No results for input(s): LIPASE, AMYLASE in the last 168 hours. No results for input(s): AMMONIA in the last 168 hours.  CBC: Recent Labs  Lab 01/23/18 0538 01/24/18 0910 01/28/18 0034 01/28/18 0432 01/29/18 0210  WBC  19.1* 17.2* 22.5* 33.8* 25.9*  NEUTROABS 12.7* 10.6*  --   --   --   HGB 11.7* 12.1* 10.3* 11.9* 10.3*  HCT 34.7* 36.0* 30.9* 34.5* 30.1*  MCV 93.1 93.4 96.2 93.5 92.2  PLT 204 233 288 268 113*    Cardiac Enzymes: Recent Labs  Lab 01/28/18 0433 01/28/18 0816 01/28/18 1440 01/28/18 2151 01/29/18 0210  TROPONINI 0.50* 0.90* 1.17* 1.36* 1.35*    BNP: Invalid input(s): POCBNP  CBG: Recent Labs  Lab 01/29/18 0317 01/29/18 0413 01/29/18 0520 01/29/18 0717 01/29/18 1136  GLUCAP 137* 143* 115* 115* 173*    Microbiology: Results for orders placed or performed during the hospital encounter of 01/13/2018  MRSA PCR Screening     Status: None   Collection Time: 01/28/18  5:00 AM  Result Value Ref Range Status   MRSA by PCR NEGATIVE NEGATIVE Final    Comment:        The GeneXpert MRSA Assay (FDA approved for NASAL specimens only), is one component of a comprehensive MRSA colonization surveillance program. It is not intended to diagnose MRSA infection nor to guide or monitor treatment for MRSA infections. Performed at [facility], [address] [place], [place] [address]   Urine Culture     Status: None   Collection Time: 01/28/18  6:16 AM  Result Value Ref Range Status   Specimen Description   Final    URINE, RANDOM Performed at [facility], [address], [place], [place] [address]    Special Requests   Final    NONE Performed at  [facility], [address] [place], [place] [address]    Culture   Final    NO GROWTH Performed at [facility], [place] [address], [place], [place] [address]    Report Status 01/29/2018 FINAL  Final  Culture, blood (Routine X 2) w Reflex to ID Panel     Status: None (Preliminary result)   Collection Time: 01/28/18  6:45 AM  Result Value Ref Range Status   Specimen Description BLOOD LAC  Final   Special Requests   Final    BOTTLES DRAWN AEROBIC AND ANAEROBIC Blood Culture adequate volume   Culture   Final    NO GROWTH < 24 HOURS Performed at [facility], [address], [place], [place] [address]    Report Status PENDING  Incomplete  Culture, blood (Routine X 2) w Reflex to ID Panel     Status: None (Preliminary result)   Collection Time: 01/28/18  6:56 AM  Result Value Ref Range Status   Specimen Description BLOOD RAC  Final   Special Requests   Final    BOTTLES DRAWN AEROBIC AND ANAEROBIC Blood Culture results may not be optimal due to an inadequate volume of blood received in culture bottles   Culture   Final    NO GROWTH < 24 HOURS Performed at [facility], [address] [place], [place] [address]    Report Status PENDING  Incomplete  Culture, respiratory (non-expectorated)     Status: None (Preliminary result)   Collection Time: 01/28/18 11:24 AM  Result Value Ref Range Status   Specimen Description   Final    TRACHEAL ASPIRATE Performed at [facility], [address] [place], [place] [address]    Special Requests   Final    NONE Performed at [facility], [address], [place], [place] [address]    Gram Stain   Final  RARE WBC PRESENT,BOTH PMN AND MONONUCLEAR RARE GRAM POSITIVE COCCI IN PAIRS    Culture   Final    NO GROWTH < 24 HOURS Performed at [facility], [place] [address] [place], [place] [address]    Report Status PENDING  Incomplete    Coagulation Studies: Recent Labs     01/28/18 0433 01/28/18 1145  LABPROT 16.0* 16.7*  INR 1.29 1.36    Urinalysis: No results for input(s): COLORURINE, LABSPEC, PHURINE, GLUCOSEU, HGBUR, BILIRUBINUR, KETONESUR, PROTEINUR, UROBILINOGEN, NITRITE, LEUKOCYTESUR in the last 168 hours.  Invalid input(s): APPERANCEUR  Lipid Panel:     Component Value Date/Time   CHOL 191 01/20/2018 0459   TRIG 222 (H) 01/28/2018 0433   HDL NOT REPORTED DUE TO HIGH TRIGLYCERIDES 01/20/2018 0459   CHOLHDL NOT REPORTED DUE TO HIGH TRIGLYCERIDES 01/20/2018 0459   VLDL UNABLE TO CALCULATE IF TRIGLYCERIDE OVER 400 mg/dL 01/20/2018 0459   LDLCALC UNABLE TO CALCULATE IF TRIGLYCERIDE OVER 400 mg/dL 01/20/2018 0459    HgbA1C:  Lab Results  Component Value Date   HGBA1C 8.6 (H) 01/29/2018    Urine Drug Screen:  No results found for: LABOPIA, COCAINSCRNUR, LABBENZ, AMPHETMU, THCU, LABBARB  Alcohol Level: No results for input(s): ETH in the last 168 hours.   Imaging: Dg Abd 1 View  Result Date: 01/28/2018 CLINICAL DATA:  NG tube placement EXAM: ABDOMEN - 1 VIEW COMPARISON:  01/20/2018 FINDINGS: No enteric tube is demonstrated within the field of view. Mild gaseous distention of the stomach. IMPRESSION: No enteric tube visualized. Electronically Signed   By: Lucienne Capers M.D.   On: 01/28/2018 05:53   Ct Head Wo Contrast  Result Date: 01/28/2018 CLINICAL DATA:  Cardiac arrest. History of hypertension and diabetes. EXAM: CT HEAD WITHOUT CONTRAST TECHNIQUE: Contiguous axial images were obtained from the base of the skull through the vertex without intravenous contrast. COMPARISON:  CT neck January 20, 2018. FINDINGS: BRAIN: No intraparenchymal hemorrhage, mass effect nor midline shift. Mild blurring of the gray-white matter differentiation. Patchy supratentorial white matter hypodensities. The ventricles and sulci are normal. No acute large vascular territory infarcts. No abnormal extra-axial fluid collections. Basal cisterns are patent. VASCULAR: Mild  calcific atherosclerosis carotid bifurcations. SKULL/SOFT TISSUES: No skull fracture. No significant soft tissue swelling. ORBITS/SINUSES: The included ocular globes and orbital contents are normal.Mild paranasal sinus mucosal thickening without air-fluid levels. Included mastoid air cells are well aerated. OTHER: Life-support lines in place. IMPRESSION: 1. 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 2. Mild chronic small vessel ischemic changes. 3. Mild atherosclerosis. 4. Acute findings discussed with and reconfirmed by Dr.ALLISON WEBSTER on 01/28/2018 at 1:00 am. Electronically Signed   By: Elon Alas M.D.   On: 01/28/2018 01:04   Dg Chest Port 1 View  Result Date: 01/28/2018 CLINICAL DATA:  Line placement EXAM: PORTABLE CHEST 1 VIEW COMPARISON:  01/28/2018 FINDINGS: Endotracheal tube measures 6.8 cm above the carina. A left central venous catheter has been placed with tip over the confluence of the brachiocephalic vein and IVC. Shallow inspiration. Cardiac enlargement. Bilateral perihilar infiltrates. Probable small right pleural effusion. No pneumothorax is visible. IMPRESSION: Appliances appear in satisfactory position. Cardiac enlargement with bilateral perihilar infiltrates. Small right pleural effusion. No pneumothorax. Electronically Signed   By: Lucienne Capers M.D.   On: 01/28/2018 05:52   Dg Chest Portable 1 View  Result Date: 01/28/2018 CLINICAL DATA:  Short of breath EXAM: PORTABLE CHEST 1 VIEW COMPARISON:  01/24/2018, 01/21/2018 FINDINGS: Interval intubation, tip of the endotracheal tube is  about 4 cm superior to the carina. Worsening bilateral interstitial and alveolar disease. No large effusion. Stable mild cardiomegaly. No pneumothorax. IMPRESSION: 1. Endotracheal tube tip about 3.5 cm superior to the carina. 2. Cardiomegaly. Interim worsening of bilateral interstitial and alveolar airspace disease. Electronically Signed   By: Donavan Foil M.D.   On:  01/28/2018 00:29     Assessment/Plan:  57 y.o. male that was recently hospitalized with hypercapnic respiratory arrest.  Was discharged several days ago.  Patient's wife noted him to be progressively shortness of breath and brought him to the emergency room.  On arrival to the emergency room the patient was unresponsive and pulseless.  He had no respirations.  CPR was initiated and continued for approximately 17 minutes in the emergency room. Currently on hypothermia protocol and multiple pressors.  - CTH day 1 signs of anoxia which is a very poor prognosticator in its own - Currently no brainstem reflexes but pt is sedated - On hypothermia protocol - At this time give the very poor heart failure, imaging, down time can state the prognosis is grim for any recovery - Family is "hoping for a miracle" - VPA load of 15mg /kg - On propofol and fentanyl - After hypothermia protocol is over will need to limit sedation for possibility of brain death testing.

## 2018-01-29 NOTE — Progress Notes (Signed)
CRITICAL VALUE ALERT  Critical Value:  Lactic 3.5  Date & Time Notied:  07/28 @ 0307  Provider Notified: Ms. Patria Mane, NP  Orders Received/Actions taken: NP ordered 200 mL NS bolus, will monitor VSS and UOP and report impact of bolus.

## 2018-01-29 NOTE — Progress Notes (Signed)
[place] [facility] [place] NAME: [name]    MR#:  [id number]  DATE OF BIRTH:  [birth year]  SUBJECTIVE:   Patient remains intubated and currently being warmed after hypothermic protocol.  Continues to have some myoclonic jerking movements which as per neurology is related to anoxic brain injury.  Patient's prognosis is quite poor given his multiorgan failure.  REVIEW OF SYSTEMS:    Review of Systems  Unable to perform ROS: Intubated    Nutrition: NPO Tolerating Diet: No Tolerating PT: Await Eval once extubated.   DRUG ALLERGIES:   Allergies  Allergen Reactions  . Enalapril Maleate Anaphylaxis  . Iodinated Diagnostic Agents Itching  . Isosorbide Mononitrate Er [Isosorbide Dinitrate] Other (See Comments)    "made me not be able to think or function"  . Canagliflozin Other (See Comments)    VITALS:  Blood pressure (!) 94/57, pulse 79, temperature (!) 95 F (35 C), resp. rate (!) 25, height 6\' 1"  (1.854 m), weight 116.2 kg (256 lb 2.8 oz), SpO2 96 %.  PHYSICAL EXAMINATION:   Physical Exam  GENERAL:  57 y.o.-year-old patient lying in bed sedated & Intubated.  EYES: PERRL but minimally responsive.  HEENT: Head atraumatic, normocephalic. ET and OG tubes in place.   NECK:  Supple, no jugular venous distention. No thyroid enlargement, no tenderness.  LUNGS: Normal breath sounds bilaterally, no wheezing, rales, rhonchi. No use of accessory muscles of respiration.  CARDIOVASCULAR: S1, S2 normal. No murmurs, rubs, or gallops.  ABDOMEN: Soft, nontender, nondistended. Bowel sounds present. No organomegaly or mass.  EXTREMITIES: No cyanosis, clubbing or edema b/l.    NEUROLOGIC: Sedated & Intubated  PSYCHIATRIC: Sedated & Intubated.   SKIN: No obvious rash, lesion, or ulcer.    LABORATORY PANEL:   CBC Recent Labs  Lab 01/29/18 0210  WBC 25.9*  HGB 10.3*  HCT 30.1*  PLT 113*    ------------------------------------------------------------------------------------------------------------------  Chemistries  Recent Labs  Lab 01/28/18 0433  01/29/18 0210  01/29/18 1152  NA 141   < > 139   < > 140  K 4.7   < > 3.1*   < > 4.3  CL 105   < > 109   < > 108  CO2 27   < > 20*   < > 20*  GLUCOSE 333*   < > 167*   < > 187*  BUN 23*   < > 33*   < > 37*  CREATININE 1.77*   < > 2.39*   < > 2.76*  CALCIUM 9.1   < > 7.8*   < > 7.6*  MG  --    < > 2.0  --   --   AST 59*  --   --   --   --   ALT 36  --   --   --   --   ALKPHOS 54  --   --   --   --   BILITOT 1.0  --   --   --   --    < > = values in this interval not displayed.   ------------------------------------------------------------------------------------------------------------------  Cardiac Enzymes Recent Labs  Lab 01/29/18 0210  TROPONINI 1.35*   ------------------------------------------------------------------------------------------------------------------  RADIOLOGY:  Dg Abd 1 View  Result Date: 01/28/2018 CLINICAL DATA:  NG tube placement EXAM: ABDOMEN - 1 VIEW COMPARISON:  01/20/2018 FINDINGS: No enteric tube is demonstrated within the field of view. Mild gaseous distention of the stomach. IMPRESSION: No enteric  tube visualized. Electronically Signed   By: Lucienne Capers M.D.   On: 01/28/2018 05:53   Ct Head Wo Contrast  Result Date: 01/28/2018 CLINICAL DATA:  Cardiac arrest. History of hypertension and diabetes. EXAM: CT HEAD WITHOUT CONTRAST TECHNIQUE: Contiguous axial images were obtained from the base of the skull through the vertex without intravenous contrast. COMPARISON:  CT neck January 20, 2018. FINDINGS: BRAIN: No intraparenchymal hemorrhage, mass effect nor midline shift. Mild blurring of the gray-white matter differentiation. Patchy supratentorial white matter hypodensities. The ventricles and sulci are normal. No acute large vascular territory infarcts. No abnormal extra-axial fluid  collections. Basal cisterns are patent. VASCULAR: Mild calcific atherosclerosis carotid bifurcations. SKULL/SOFT TISSUES: No skull fracture. No significant soft tissue swelling. ORBITS/SINUSES: The included ocular globes and orbital contents are normal.Mild paranasal sinus mucosal thickening without air-fluid levels. Included mastoid air cells are well aerated. OTHER: Life-support lines in place. IMPRESSION: 1. 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 2. Mild chronic small vessel ischemic changes. 3. Mild atherosclerosis. 4. Acute findings discussed with and reconfirmed by Dr.ALLISON WEBSTER on 01/28/2018 at 1:00 am. Electronically Signed   By: Elon Alas M.D.   On: 01/28/2018 01:04   Dg Chest Port 1 View  Result Date: 01/28/2018 CLINICAL DATA:  Line placement EXAM: PORTABLE CHEST 1 VIEW COMPARISON:  01/28/2018 FINDINGS: Endotracheal tube measures 6.8 cm above the carina. A left central venous catheter has been placed with tip over the confluence of the brachiocephalic vein and IVC. Shallow inspiration. Cardiac enlargement. Bilateral perihilar infiltrates. Probable small right pleural effusion. No pneumothorax is visible. IMPRESSION: Appliances appear in satisfactory position. Cardiac enlargement with bilateral perihilar infiltrates. Small right pleural effusion. No pneumothorax. Electronically Signed   By: Lucienne Capers M.D.   On: 01/28/2018 05:52   Dg Chest Portable 1 View  Result Date: 01/28/2018 CLINICAL DATA:  Short of breath EXAM: PORTABLE CHEST 1 VIEW COMPARISON:  01/24/2018, 01/21/2018 FINDINGS: Interval intubation, tip of the endotracheal tube is about 4 cm superior to the carina. Worsening bilateral interstitial and alveolar disease. No large effusion. Stable mild cardiomegaly. No pneumothorax. IMPRESSION: 1. Endotracheal tube tip about 3.5 cm superior to the carina. 2. Cardiomegaly. Interim worsening of bilateral interstitial and alveolar airspace disease.  Electronically Signed   By: Donavan Foil M.D.   On: 01/28/2018 00:29     ASSESSMENT AND PLAN:   57 year old male with past medical history of ischemic cardiomyopathy ejection fraction of 20 to 51%, chronic systolic CHF, Obstructive sleep apnea, hypertension, diabetes who presented to the hospital as he was found unresponsive and noted to be in pulseless cardiac arrest.  1.  Pulseless cardiac arrest-as a result of worsening respiratory failure and flash pulmonary edema. - Patient was resuscitated in the ER for about 17 minutes prior to regaining spontaneous circulation. - Now being warmed back up after Hypothermic protocol.    2.  Acute kidney injury with oliguria-secondary to underlying cardiac arrest and cardiorenal hemodynamics. -Continue IV fluids, Levophed.  Follow BUN and creatinine and urine output.  3.  Altered mental status/encephalopathy-suspected to be secondary to underlying anoxic brain injury. - Neurology was consulted, they explained to the patient's wife about patient's poor prognosis and unlikely that he would recover from this.   -Patient continues to have myoclonic jerking movements consistent with anoxic brain injury.  Patient has no brainstem reflexes and as per neurology prognosis is very poor and grim chance of recovery.  Patient's family is still hopeful.  As per neurology patient has been loaded  with Depakote for now.  4.  Sepsis/septic shock-suspected but source remains unclear.  Continue empiric vancomycin, cefepime. -Continue vasopressors, follow cultures which are (-) so far.   5.  History of ischemic cardiomyopathy with ejection fraction of 20 to 25%- patient does not appear to be volume overloaded presently.   6.  Leukocytosis-secondary to suspected sepsis.  Follow with IV antibiotic therapy.  Patient has a very poor prognosis given his multiple comorbidities and now with multiorgan failure.  Consider palliative care consult to discuss goals of care.   All  the records are reviewed and case discussed with Care Management/Social Worker. Management plans discussed with the patient, family and they are in agreement.  CODE STATUS: Full code  DVT Prophylaxis: Lovenox  TOTAL TIME TAKING CARE OF THIS PATIENT: 30 minutes.   POSSIBLE D/C IN unclear, DEPENDING ON CLINICAL CONDITION and course   Henreitta Leber M.D on 01/29/2018 at 3:46 PM  Between 7am to 6pm - Pager - [phone]  After 6pm go to [email or web address] - password [email or web address] [facility]  Office  [phone]  CC: Primary care physician; Marguerita Merles, MD

## 2018-01-29 NOTE — Progress Notes (Signed)
Initial Nutrition Assessment  DOCUMENTATION CODES:   Obesity unspecified  INTERVENTION:  Patient is not appropriate for enteral nutrition at this time and there is also not currently any enteral access available as NGT/OGT were unable to be placed after multiple attempts.  Will continue to monitor discussions regarding goals of care and plan of care. 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  If patient's family desires full scope of care can consider initiation of tube feeds once patient is rewarmed, hemodynamically stable, and enteral access is obtained. Would recommend goal TF regimen of Vital High Protein at 25 mL/hr (600 mL goal daily volume) + Pro-Stat 60 mL QID. Provides 1400 kcal, 173 grams of protein, 504 mL H2O daily. With current propofol rate provides 1894 kcal daily.  If tube feeds are initiated provide liquid MVI daily per tube as goal TF regimen does not meet 100% RDIs for vitamins/minerals.  NUTRITION DIAGNOSIS:   Inadequate oral intake related to inability to eat as evidenced by NPO status.  GOAL:   Provide needs based on ASPEN/SCCM guidelines  MONITOR:   Vent status, Labs, Weight trends, TF tolerance, I & O's  REASON FOR ASSESSMENT:   Ventilator    ASSESSMENT:   57 year old male with PMHx of DM type 2, HTN, sleep apnea, goiter with recent admission 7/29-7/23 for acute hypoxic respiratory failure due to CHF(LVEF 20-25% on 7/19) and bilateral pleural effusions requiring intubation 7/19-7/20 who was now re-admitted on 7/26 in cardiac and respiratory arrest, required CPR with total downtime of 17 minutes in ED, required intubation on 7/27, on 33C TTM protocol, likely anoxic brain injury with myoclonus, severe cardiomyopathy with systolic failure (LVEF now 15% on 7/27), and renal failure.   Patient intubated and sedated. Having myoclonic jerks at time of RD assessment. On PRVC mode with FiO2 50%. Patient is on 33C TTM protocol. Patient  started rewarming at 0745 this AM. Prior to current admission weights in chart were around 229 lbs. Currently 256.2 lbs, which may be falsely elevated with fluid. Will use weight of 103.9 kg from 01/23/2018 to estimate needs.  No enteral access able to be obtained after multiple attempts. Of note, on recent admission enteral access was also not able to be obtained while patient was intubated.  MAP: 63-94 mmHg  Patient is currently intubated on ventilator support Ve: 13.7 L/min Temp (24hrs), Avg:91.4 F (33 C), Min:89.8 F (32.1 C), Max:94.1 F (34.5 C)  Propofol: 18.7 mL/hr (494 kcal daily)  Medications reviewed and include: Novolog 0-15 units Q4hrs, Lantus 5 units QHS, cefepime, Nimbex, famotidine, fentanyl gtt, norepinephrine gtt at 10 mcg/min, propofol gtt, vancomycin, vasopressin gtt at 0.03 units/min.  Labs reviewed: CBG 115-173, CO2 20, BUN 37, Creatinine 2.76.  I/O: only 145 mL UOP yesterday and 15 mL UOP so far this shift  Patient does not meet criteria for malnutrition at this time.  Discussed with RN and MD. Depending on goals of care may consult IR for placement of enteral access on 7/29.  NUTRITION - FOCUSED PHYSICAL EXAM:    Most Recent Value  Orbital Region  No depletion  Upper Arm Region  No depletion  Thoracic and Lumbar Region  No depletion  Buccal Region  Unable to assess  Temple Region  No depletion  Clavicle Bone Region  No depletion  Clavicle and Acromion Bone Region  No depletion  Scapular Bone Region  Unable to assess  Dorsal Hand  No depletion  Patellar Region  No depletion  Anterior Thigh Region  No depletion  Posterior Calf Region  No depletion  Edema (RD Assessment)  Mild [upper and lower extremities]  Hair  Reviewed  Eyes  Unable to assess  Mouth  Unable to assess  Skin  Reviewed  Nails  Reviewed     Diet Order:   Diet Order           Diet NPO time specified  Diet effective now          EDUCATION NEEDS:   No education needs have  been identified at this time  Skin:  Skin Assessment: Reviewed RN Assessment(ecchymosis to abdomen)  Last BM:  Unknown/PTA  Height:   Ht Readings from Last 1 Encounters:  01/29/18 6\' 1"  (1.854 m)    Weight:   Wt Readings from Last 1 Encounters:  01/29/18 256 lb 2.8 oz (116.2 kg)    Ideal Body Weight:  83.6 kg  BMI:  Body mass index is 33.8 kg/m.  Estimated Nutritional Needs:   Kcal:  7371-0626 (11-14 kcal/kg)  Protein:  >/= 167 grams (2 grams/kg IBW)  Fluid:  2 L/day (25 mL/kg IBW)  Willey Blade, MS, RD, LDN Office: [phone] Pager: [phone] After Hours/Weekend Pager: [phone]

## 2018-01-29 NOTE — Progress Notes (Signed)
Discussed with Ms. Patria Mane, NP pt.'s K+, 4 CBG's under 180 and impact from 200 mL bolus 40 meq of K+, second 250 mL bolus, and transition insulin orders placed Will continue to monitor pt. closely

## 2018-01-29 NOTE — Progress Notes (Signed)
Rewarming begun at 0745 this AM, will not complete on writing RN's shift.  Propofol off for approx 2.5 hours this AM, pt with irregular jerking motions of extremities and head during that time.  This abated somewhat but not entirely after propofol was restarted.  No real effect from load of Depacon infused per neurologist.  Potassium trended up to 5.0 by end of shift, no UOP resulting from 1 liter bolus given.  Dr Jefferson Fuel aware.  Continue to monitor for now. There have been no dysrythmias observed. When warming is complete if pt exhibits signs of preserved neurological functioning and the family wishes to pursue aggressive care an HD cath will have to be placed and CRRT likely begun.

## 2018-01-29 NOTE — Progress Notes (Signed)
Follow up - Critical Care Medicine Note  Patient Details:    [name] is an 57 y.o. male.with a history of type 2 diabetes hypertension and sleep apnea who was brought to the ED by his spouse in cardiac and respiratory arrest.    Lines, Airways, Drains: Airway 7 mm (Active)  Secured at (cm) 24 cm 01/29/2018  8:04 AM  Measured From Lips 01/29/2018  8:04 AM  [place] 01/29/2018  8:04 AM  Secured By [employer] 01/29/2018  8:04 AM  Tube Holder Repositioned Yes 01/29/2018  3:38 AM  Cuff Pressure (cm H2O) 25 cm H2O 01/29/2018  8:04 AM  Site Condition Dry 01/29/2018  8:04 AM     CVC Triple Lumen 01/28/18 Left Internal jugular (Active)  Indication for Insertion or Continuance of Line Vasoactive infusions 01/29/2018  8:00 AM  Site Assessment Clean;Dry;Intact 01/29/2018  8:00 AM  Proximal Lumen Status Infusing;Flushed;Blood return noted 01/29/2018  8:00 AM  Medial Lumen Status Infusing;Flushed;Blood return noted 01/29/2018  8:00 AM  Distal Lumen Status Infusing;Flushed;Blood return noted 01/29/2018  8:00 AM  Dressing Type Transparent;Occlusive 01/29/2018  8:00 AM  Dressing Status Clean;Dry;Intact;Antimicrobial disc in place 01/29/2018  8:00 AM  Line Care Connections checked and tightened;Zeroed and calibrated;Leveled 01/29/2018  8:00 AM  Dressing Intervention New dressing 01/28/2018  4:00 AM  Dressing Change Due 02/04/18 01/29/2018  8:00 AM     Arterial Line 01/28/18 Right Radial (Active)  Site Assessment Clean;Dry;Intact 01/29/2018  8:00 AM  Line Status Pulsatile blood flow;Positional 01/29/2018  8:00 AM  Art Line Waveform Appropriate 01/29/2018  8:00 AM  Art Line Interventions Zeroed and calibrated 01/29/2018  8:00 AM  Color/Movement/Sensation Capillary refill less than 3 sec 01/29/2018  8:00 AM  Dressing Type Transparent;Occlusive 01/29/2018  8:00 AM  Dressing Status Clean;Intact;Dry;Antimicrobial disc in place 01/29/2018  8:00 AM  Interventions New dressing;Antimicrobial disc  changed 01/28/2018  4:00 AM  Dressing Change Due 02/04/18 01/29/2018  8:00 AM     Urethral Catheter THAHN, ed tech Straight-tip;Temperature probe 16 Fr. (Active)  Indication for Insertion or Continuance of Catheter Unstable critical patients (first 24-48 hours) 01/29/2018  8:00 AM  Site Assessment Clean;Intact;Dry 01/29/2018  8:00 AM  Catheter Maintenance Bag below level of bladder;Catheter secured;Drainage bag/tubing not touching floor 01/29/2018  8:00 AM  Collection Container Standard drainage bag 01/29/2018  8:00 AM  Securement Method Other (Comment) 01/29/2018  8:00 AM  Output (mL) 0 mL 01/29/2018  8:00 AM    Anti-infectives:  Anti-infectives (From admission, onward)   Start     Dose/Rate Route Frequency Ordered Stop   01/28/18 1400  vancomycin (VANCOCIN) 1,250 mg in sodium chloride 0.9 % 250 mL IVPB     1,250 mg 166.7 mL/hr over 90 Minutes Intravenous Every 18 hours 01/28/18 0544     01/28/18 0600  ceFEPIme (MAXIPIME) 2 g in sodium chloride 0.9 % 100 mL IVPB     2 g 200 mL/hr over 30 Minutes Intravenous Every 12 hours 01/28/18 0544     01/28/18 0545  vancomycin (VANCOCIN) 1,250 mg in sodium chloride 0.9 % 250 mL IVPB     1,250 mg 166.7 mL/hr over 90 Minutes Intravenous  Once 01/28/18 0544 01/28/18 1020      Microbiology: Results for orders placed or performed during the hospital encounter of 01/22/2018  MRSA PCR Screening     Status: None   Collection Time: 01/28/18  5:00 AM  Result Value Ref Range Status   MRSA by PCR NEGATIVE NEGATIVE Final  Comment:        The GeneXpert MRSA Assay (FDA approved for NASAL specimens only), is one component of a comprehensive MRSA colonization surveillance program. It is not intended to diagnose MRSA infection nor to guide or monitor treatment for MRSA infections. Performed at [facility], [address] [place], [place] [address]   Culture, blood (Routine X 2) w Reflex to ID Panel     Status: None (Preliminary result)    Collection Time: 01/28/18  6:45 AM  Result Value Ref Range Status   Specimen Description BLOOD LAC  Final   Special Requests   Final    BOTTLES DRAWN AEROBIC AND ANAEROBIC Blood Culture adequate volume   Culture   Final    NO GROWTH < 24 HOURS Performed at [facility], [address] [place], [place] [address]    Report Status PENDING  Incomplete  Culture, blood (Routine X 2) w Reflex to ID Panel     Status: None (Preliminary result)   Collection Time: 01/28/18  6:56 AM  Result Value Ref Range Status   Specimen Description BLOOD RAC  Final   Special Requests   Final    BOTTLES DRAWN AEROBIC AND ANAEROBIC Blood Culture results may not be optimal due to an inadequate volume of blood received in culture bottles   Culture   Final    NO GROWTH < 24 HOURS Performed at [facility], [address] [place], [place] [address]    Report Status PENDING  Incomplete  Culture, respiratory (non-expectorated)     Status: None (Preliminary result)   Collection Time: 01/28/18 11:24 AM  Result Value Ref Range Status   Specimen Description   Final    TRACHEAL ASPIRATE Performed at [facility], [address], [place], [place] [address]    Special Requests   Final    NONE Performed at [facility], [address] [place], [place] [address]    Gram Stain   Final    RARE WBC PRESENT,BOTH PMN AND MONONUCLEAR RARE GRAM POSITIVE COCCI IN PAIRS    Culture   Final    NO GROWTH < 24 HOURS Performed at [facility], [place] [address] [place], [place] [address]    Report Status PENDING  Incomplete   Events:   Studies: Dg Abd 1 View  Result Date: 01/28/2018 CLINICAL DATA:  NG tube placement EXAM: ABDOMEN - 1 VIEW COMPARISON:  01/20/2018 FINDINGS: No enteric tube is demonstrated within the field of view. Mild gaseous distention of the stomach. IMPRESSION: No enteric tube visualized. Electronically Signed   By: Lucienne Capers M.D.   On: 01/28/2018  05:53   Dg Abd 1 View  Result Date: 01/20/2018 CLINICAL DATA:  Encounter for NG tube placement EXAM: ABDOMEN - 1 VIEW COMPARISON:  01/20/2018 FINDINGS: Malpositioned nasogastric tube, coiled backwards overlying the mid esophagus with tip excluded from view Endotracheal tube overlies the tracheal air column with tip superior to the carina. No pneumothorax. Pacer leads overlie the left ventricular apex. Pulmonary vasculature remains indistinct with cephalization of flow. Suspected trace right-sided pleural effusion. No definite left-sided pleural effusion, though the left costophrenic angle is excluded from view. Nonobstructive bowel gas pattern. No pneumoperitoneum, pneumatosis or portal venous gas. IMPRESSION: 1. Malpositioned nasogastric tube.  Repositioning is advised. 2. Similar findings of pulmonary edema and trace right-sided pleural effusion. 3. Nonobstructive bowel gas pattern. Electronically Signed   By: Sandi Mariscal M.D.   On: 01/20/2018 14:03  Dg Abd 1 View  Result Date: 01/20/2018 CLINICAL DATA:  NG tube placement EXAM: ABDOMEN - 1 VIEW COMPARISON:  None. FINDINGS: There is no nasogastric tube identified in the lower chest or abdomen. There is mild gaseous distension of the colon and stomach. There is no evidence of pneumoperitoneum, portal venous gas or pneumatosis. There are no pathologic calcifications along the expected course of the ureters. The osseous structures are unremarkable. IMPRESSION: No nasogastric tube identified in the lower chest or abdomen. Electronically Signed   By: Kathreen Devoid   On: 01/20/2018 12:42   Dg Abd 1 View  Result Date: 01/20/2018 CLINICAL DATA:  Abdominal pain. EXAM: ABDOMEN - 1 VIEW COMPARISON:  None. FINDINGS: A right-sided pleural effusion is suspected layering posteriorly. Pacer paddles are noted on the chest. The bowel gas pattern is unremarkable. Air and stool scattered throughout the colon. A few scattered air-filled small bowel loops. No obvious free  air. A right femoral line is noted. Bony structures are unremarkable. IMPRESSION: Suspect right pleural effusion. No plain film findings for an acute abdominal process. Electronically Signed   By: Marijo Sanes M.D.   On: 01/20/2018 11:22   Ct Head Wo Contrast  Result Date: 01/28/2018 CLINICAL DATA:  Cardiac arrest. History of hypertension and diabetes. EXAM: CT HEAD WITHOUT CONTRAST TECHNIQUE: Contiguous axial images were obtained from the base of the skull through the vertex without intravenous contrast. COMPARISON:  CT neck January 20, 2018. FINDINGS: BRAIN: No intraparenchymal hemorrhage, mass effect nor midline shift. Mild blurring of the gray-white matter differentiation. Patchy supratentorial white matter hypodensities. The ventricles and sulci are normal. No acute large vascular territory infarcts. No abnormal extra-axial fluid collections. Basal cisterns are patent. VASCULAR: Mild calcific atherosclerosis carotid bifurcations. SKULL/SOFT TISSUES: No skull fracture. No significant soft tissue swelling. ORBITS/SINUSES: The included ocular globes and orbital contents are normal.Mild paranasal sinus mucosal thickening without air-fluid levels. Included mastoid air cells are well aerated. OTHER: Life-support lines in place. IMPRESSION: 1. 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 2. Mild chronic small vessel ischemic changes. 3. Mild atherosclerosis. 4. Acute findings discussed with and reconfirmed by Dr.ALLISON WEBSTER on 01/28/2018 at 1:00 am. Electronically Signed   By: Elon Alas M.D.   On: 01/28/2018 01:04   Ct Soft Tissue Neck Wo Contrast  Result Date: 01/20/2018 CLINICAL DATA:  57 y/o  M; thyroid nodule. EXAM: CT NECK WITHOUT CONTRAST TECHNIQUE: Multidetector CT imaging of the neck was performed following the standard protocol without intravenous contrast. COMPARISON:  None. FINDINGS: Pharynx and larynx: Debris within the airways from intubation. Salivary glands: No  inflammation, mass, or stone. Thyroid: 5.6 cm nodule within the left lobe of the thyroid. 18 mm nodule in the right lobe of thyroid. Lymph nodes: None enlarged or abnormal density. Vascular: Negative. Limited intracranial: Negative. Visualized orbits: Negative. Mastoids and visualized paranasal sinuses: Mild mucosal thickening of the left maxillary sinus. Normal aeration of the mastoid air cells. Skeleton: No acute or aggressive process. Dental disease with multiple periapical cysts and absent crowns. Mild cervical spondylosis greatest at the C5-6 level. Upper chest: Endotracheal tube tip 3.4 cm above the carina. Moderate bilateral pleural effusions and dependent atelectasis of the lungs. Other: None. IMPRESSION: 1. Thyroid nodules measuring up to 5.6 cm in the left lobe of thyroid. Mass effect from the large left lobe of thyroid nodule displaces the airway rightward. 2. Endotracheal tube tip 3.4 cm above the carina. 3. Moderate bilateral pleural effusion with dependent atelectasis of the lungs. Electronically Signed  By: Kristine Garbe M.D.   On: 01/20/2018 04:44   Dg Chest Port 1 View  Result Date: 01/28/2018 CLINICAL DATA:  Line placement EXAM: PORTABLE CHEST 1 VIEW COMPARISON:  01/28/2018 FINDINGS: Endotracheal tube measures 6.8 cm above the carina. A left central venous catheter has been placed with tip over the confluence of the brachiocephalic vein and IVC. Shallow inspiration. Cardiac enlargement. Bilateral perihilar infiltrates. Probable small right pleural effusion. No pneumothorax is visible. IMPRESSION: Appliances appear in satisfactory position. Cardiac enlargement with bilateral perihilar infiltrates. Small right pleural effusion. No pneumothorax. Electronically Signed   By: Lucienne Capers M.D.   On: 01/28/2018 05:52   Dg Chest Portable 1 View  Result Date: 01/28/2018 CLINICAL DATA:  Short of breath EXAM: PORTABLE CHEST 1 VIEW COMPARISON:  01/24/2018, 01/21/2018 FINDINGS: Interval  intubation, tip of the endotracheal tube is about 4 cm superior to the carina. Worsening bilateral interstitial and alveolar disease. No large effusion. Stable mild cardiomegaly. No pneumothorax. IMPRESSION: 1. Endotracheal tube tip about 3.5 cm superior to the carina. 2. Cardiomegaly. Interim worsening of bilateral interstitial and alveolar airspace disease. Electronically Signed   By: Donavan Foil M.D.   On: 01/28/2018 00:29   Dg Chest Port 1 View  Result Date: 01/24/2018 CLINICAL DATA:  Respiratory failure EXAM: PORTABLE CHEST 1 VIEW COMPARISON:  01/21/2018, 01/20/2018, 12/13/2017 FINDINGS: Removal of the endotracheal tube. Slight increased upper lobe ground-glass opacity. Improved aeration at the bases. Stable slightly enlarged cardiomediastinal silhouette. No pneumothorax. IMPRESSION: 1. Removal of endotracheal tube 2. Improved aeration at the bilateral lung bases. Slight interval increase in bilateral upper lobe ground-glass opacity. 3. Stable mild cardiomegaly Electronically Signed   By: Donavan Foil M.D.   On: 01/24/2018 03:58   Dg Chest Port 1 View  Result Date: 01/21/2018 CLINICAL DATA:  Acute respiratory failure. EXAM: PORTABLE CHEST 1 VIEW COMPARISON:  01/20/2018 FINDINGS: The endotracheal tube is in good position at the mid tracheal level. The lungs demonstrate improved aeration with resolving edema and atelectasis. Probable persistent small layering right pleural effusion. IMPRESSION: Stable position of the endotracheal tube. Improved lung aeration with resolving edema and atelectasis. Electronically Signed   By: Marijo Sanes M.D.   On: 01/21/2018 08:36   Dg Chest Portable 1 View  Result Date: 01/20/2018 CLINICAL DATA:  57 y/o  M; status post intubation. EXAM: PORTABLE CHEST 1 VIEW COMPARISON:  01/20/2018 chest radiograph FINDINGS: Stable cardiomegaly given projection and technique. Interstitial and alveolar pulmonary edema. Probable small effusions. Endotracheal tube tip 4.5 cm above  the carina. Transcutaneous pacing pads noted. Bones are unremarkable. IMPRESSION: 1. Endotracheal tube tip 4.5 cm above the carina. 2. Stable cardiomegaly and pulmonary edema. Probable small effusions. Electronically Signed   By: Kristine Garbe M.D.   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 Stable cardiomegaly. Nonaneurysmal thoracic aorta. Diffuse bilateral airspace disease and vascular congestion consistent with pulmonary edema. No significant effusion or pneumothorax. No acute osseous abnormality. IMPRESSION: Stable cardiomegaly with pulmonary edema. Superimposed pneumonia is not entirely excluded but believed less likely. Electronically Signed   By: Ashley Royalty M.D.   On: 01/20/2018 02:56   Dg Addison Bailey G Tube Plc W/fl W/rad  Result Date: 01/20/2018 CLINICAL DATA:  Patient presents for Dobbhoff tube placement. EXAM: NASO G TUBE PLACEMENT WITH FL AND WITH RAD FLUOROSCOPY TIME:  Fluoroscopy  Time:  0.3 minute Radiation Exposure Index (if provided by the fluoroscopic device): 1.3 mGy Number of Acquired Spot Images: 0 COMPARISON:  None. FINDINGS: Multiple attempts at placing a Dobbhoff tube were made through the right and left nare. The tip of the Dobbhoff tube would not progressed beyond the level of the mid trachea on repeated attempts. Attempts were made at advancing the Dobbhoff tube following deflating the tracheostomy cuff, but the Dobbhoff tube could not advance any further. Examination was terminated at this time. IMPRESSION: 1. Multiple unsuccessful attempts at placing a Dobbhoff tube which would not progressed beyond the level of the mid trachea Electronically Signed   By: Kathreen Devoid   On: 01/20/2018 16:41    Consults: Treatment Team:  Teodoro Spray, MD Leotis Pain, MD   Subjective:     Overnight Issues: patient is presently on hypothermia protocol, fentanyl and propofol, norepinephrine and vasopressin. Is unresponsive. Only noted to have myoclonic jerks  Objective:  Vital signs for last 24 hours: Temp:  [89.8 F (32.1 C)-92.5 F (33.6 C)] 91.6 F (33.1 C) (07/28 0900) Pulse Rate:  [46-76] 70 (07/28 0900) Resp:  [18-26] 23 (07/28 0900) BP: (73-135)/(56-91) 120/78 (07/28 0900) SpO2:  [90 %-98 %] 91 % (07/28 0900) Arterial Line BP: (72-148)/(48-81) 140/70 (07/28 0900) FiO2 (%):  [40 %-80 %] 40 % (07/28 0900) Weight:  [256 lb 2.8 oz (116.2 kg)] 256 lb 2.8 oz (116.2 kg) (07/28 0500)  Hemodynamic parameters for last 24 hours: CVP:  [1 mmHg-21 mmHg] 16 mmHg  Intake/Output from previous day: 07/27 0701 - 07/28 0700 In: 3431.9 [I.V.:2722.1; IV Piggyback:709.8] Out: 145 [Urine:145]  Intake/Output this shift: Total I/O In: 414.2 [I.V.:125.2; IV Piggyback:289] Out: 0   Vent settings for last 24 hours: Vent Mode: PRVC FiO2 (%):  [40 %-80 %] 40 % Set Rate:  [25 bmp] 25 bmp Vt Set:  [500 mL] 500 mL PEEP:  [10 cmH20] 10 cmH20 Plateau Pressure:  [25 cmH20-26 cmH20] 26 cmH20  Physical Exam:  Vital signs: . Please see the above listed vital signs HEENT: Patient is orally intubated, trachea is midline, no accessory muscle utilization Cardiovascular: Regular rate and rhythm Pulmonary: Coarse rhonchi Abdominal: Hypoactive bowel sounds, soft exam, or tics on in place Extremities: No clubbing cyanosis or edema noted Neurologic: Patient with no pupillary response, no corneals noted, no gag appreciated and unresponsive. Only myoclonic jerking noted  Assessment/Plan:   Status post cardiac arrest. Patient presently on targeted temperature management, beginning rewarming today. Appreciate cardiology's input. Most likely arrhythmic event with underlying history of severe cardiomyopathy. Ejection fraction now 15%.  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 Appreciate  neurology's input. Discussions with family regarding outlining goals of care  History of severe cardiomyopathy with systolic failure. Measured ejection fraction of 15% on echocardiogram yesterday  Renal failure. BUN 36/creatinine 2.7  Limited prognosis Critical Care Total Time 40 minutes  Jilian West 01/29/2018  *Care during the described time interval was provided by me and/or other providers on the critical care team.  I have reviewed this patient's available data, including medical history, events of note, physical examination and test results as part of my evaluation.

## 2018-01-30 ENCOUNTER — Inpatient Hospital Stay: Payer: BLUE CROSS/BLUE SHIELD

## 2018-01-30 DIAGNOSIS — N179 Acute kidney failure, unspecified: Secondary | ICD-10-CM

## 2018-01-30 LAB — BASIC METABOLIC PANEL
ANION GAP: 14 (ref 5–15)
ANION GAP: 14 (ref 5–15)
ANION GAP: 14 (ref 5–15)
BUN: 43 mg/dL — ABNORMAL HIGH (ref 6–20)
BUN: 45 mg/dL — ABNORMAL HIGH (ref 6–20)
BUN: 46 mg/dL — AB (ref 6–20)
CHLORIDE: 104 mmol/L (ref 98–111)
CHLORIDE: 104 mmol/L (ref 98–111)
CHLORIDE: 102 mmol/L (ref 98–111)
CO2: 20 mmol/L — ABNORMAL LOW (ref 22–32)
CO2: 18 mmol/L — AB (ref 22–32)
CO2: 17 mmol/L — AB (ref 22–32)
Calcium: 7.2 mg/dL — ABNORMAL LOW (ref 8.9–10.3)
Calcium: 7.3 mg/dL — ABNORMAL LOW (ref 8.9–10.3)
Calcium: 7.4 mg/dL — ABNORMAL LOW (ref 8.9–10.3)
Creatinine, Ser: 3.7 mg/dL — ABNORMAL HIGH (ref 0.61–1.24)
Creatinine, Ser: 4.05 mg/dL — ABNORMAL HIGH (ref 0.61–1.24)
Creatinine, Ser: 4.31 mg/dL — ABNORMAL HIGH (ref 0.61–1.24)
GFR calc Af Amer: 16 mL/min — ABNORMAL LOW (ref >60)
GFR calc non Af Amer: 14 mL/min — ABNORMAL LOW (ref >60)
GFR calc non Af Amer: 15 mL/min — ABNORMAL LOW (ref >60)
GFR calc non Af Amer: 17 mL/min — ABNORMAL LOW (ref >60)
GFR, EST AFRICAN AMERICAN: 19 mL/min — AB (ref >60)
GFR, EST AFRICAN AMERICAN: 17 mL/min — AB (ref >60)
Glucose, Bld: 225 mg/dL — ABNORMAL HIGH (ref 70–99)
Glucose, Bld: 259 mg/dL — ABNORMAL HIGH (ref 70–99)
Glucose, Bld: 312 mg/dL — ABNORMAL HIGH (ref 70–99)
POTASSIUM: 4.8 mmol/L (ref 3.5–5.1)
POTASSIUM: 4.6 mmol/L (ref 3.5–5.1)
Potassium: 5 mmol/L (ref 3.5–5.1)
SODIUM: 136 mmol/L (ref 135–145)
Sodium: 135 mmol/L (ref 135–145)
Sodium: 136 mmol/L (ref 135–145)

## 2018-01-30 LAB — GLUCOSE, CAPILLARY
GLUCOSE-CAPILLARY: 360 mg/dL — AB (ref 70–99)
GLUCOSE-CAPILLARY: 172 mg/dL — AB (ref 70–99)
GLUCOSE-CAPILLARY: 220 mg/dL — AB (ref 70–99)
GLUCOSE-CAPILLARY: 140 mg/dL — AB (ref 70–99)
Glucose-Capillary: 130 mg/dL — ABNORMAL HIGH (ref 70–99)
Glucose-Capillary: 156 mg/dL — ABNORMAL HIGH (ref 70–99)
Glucose-Capillary: 190 mg/dL — ABNORMAL HIGH (ref 70–99)
Glucose-Capillary: 238 mg/dL — ABNORMAL HIGH (ref 70–99)
Glucose-Capillary: 260 mg/dL — ABNORMAL HIGH (ref 70–99)
Glucose-Capillary: 361 mg/dL — ABNORMAL HIGH (ref 70–99)

## 2018-01-30 LAB — BLOOD GAS, ARTERIAL
ACID-BASE DEFICIT: 12.1 mmol/L — AB (ref 0.0–2.0)
Acid-base deficit: 10.7 mmol/L — ABNORMAL HIGH (ref 0.0–2.0)
Acid-base deficit: 4.4 mmol/L — ABNORMAL HIGH (ref 0.0–2.0)
BICARBONATE: 22.5 mmol/L (ref 20.0–28.0)
Bicarbonate: 16 mmol/L — ABNORMAL LOW (ref 20.0–28.0)
Bicarbonate: 17.2 mmol/L — ABNORMAL LOW (ref 20.0–28.0)
FIO2: 0.5
FIO2: 0.6
FIO2: 0.6
LHR: 25 {breaths}/min
LHR: 25 {breaths}/min
MECHVT: 500 mL
MECHVT: 500 mL
O2 SAT: 97.8 %
O2 Saturation: 91.6 %
O2 Saturation: 93.7 %
PATIENT TEMPERATURE: 37
PEEP/CPAP: 10 cmH2O
PEEP: 10 cmH2O
PEEP: 10 cmH2O
PH ART: 7.16 — AB (ref 7.350–7.450)
PH ART: 7.27 — AB (ref 7.350–7.450)
PO2 ART: 123 mmHg — AB (ref 83.0–108.0)
Patient temperature: 37
Patient temperature: 37
RATE: 25 resp/min
VT: 500 mL
pCO2 arterial: 45 mmHg (ref 32.0–48.0)
pCO2 arterial: 46 mmHg (ref 32.0–48.0)
pCO2 arterial: 49 mmHg — ABNORMAL HIGH (ref 32.0–48.0)
pH, Arterial: 7.18 — CL (ref 7.350–7.450)
pO2, Arterial: 80 mmHg — ABNORMAL LOW (ref 83.0–108.0)
pO2, Arterial: 79 mmHg — ABNORMAL LOW (ref 83.0–108.0)

## 2018-01-30 LAB — LACTIC ACID, PLASMA
LACTIC ACID, VENOUS: 2.6 mmol/L — AB (ref 0.5–1.9)
LACTIC ACID, VENOUS: 1.8 mmol/L (ref 0.5–1.9)

## 2018-01-30 LAB — RENAL FUNCTION PANEL
ANION GAP: 16 — AB (ref 5–15)
Albumin: 2.2 g/dL — ABNORMAL LOW (ref 3.5–5.0)
Albumin: 2.2 g/dL — ABNORMAL LOW (ref 3.5–5.0)
Anion gap: 12 (ref 5–15)
BUN: 48 mg/dL — ABNORMAL HIGH (ref 6–20)
BUN: 46 mg/dL — ABNORMAL HIGH (ref 6–20)
CHLORIDE: 100 mmol/L (ref 98–111)
CO2: 22 mmol/L (ref 22–32)
CO2: 22 mmol/L (ref 22–32)
Calcium: 7.3 mg/dL — ABNORMAL LOW (ref 8.9–10.3)
Calcium: 7.1 mg/dL — ABNORMAL LOW (ref 8.9–10.3)
Chloride: 101 mmol/L (ref 98–111)
Creatinine, Ser: 4.7 mg/dL — ABNORMAL HIGH (ref 0.61–1.24)
Creatinine, Ser: 4.5 mg/dL — ABNORMAL HIGH (ref 0.61–1.24)
GFR calc non Af Amer: 13 mL/min — ABNORMAL LOW (ref >60)
GFR calc non Af Amer: 13 mL/min — ABNORMAL LOW (ref >60)
GFR, EST AFRICAN AMERICAN: 15 mL/min — AB (ref >60)
GFR, EST AFRICAN AMERICAN: 15 mL/min — AB (ref >60)
GLUCOSE: 146 mg/dL — AB (ref 70–99)
Glucose, Bld: 184 mg/dL — ABNORMAL HIGH (ref 70–99)
POTASSIUM: 4.8 mmol/L (ref 3.5–5.1)
Phosphorus: 8 mg/dL — ABNORMAL HIGH (ref 2.5–4.6)
Phosphorus: 7 mg/dL — ABNORMAL HIGH (ref 2.5–4.6)
Potassium: 4.5 mmol/L (ref 3.5–5.1)
SODIUM: 135 mmol/L (ref 135–145)
Sodium: 138 mmol/L (ref 135–145)

## 2018-01-30 LAB — CULTURE, RESPIRATORY: CULTURE: NORMAL

## 2018-01-30 LAB — CK: CK TOTAL: 385 U/L (ref 49–397)

## 2018-01-30 LAB — CULTURE, RESPIRATORY W GRAM STAIN

## 2018-01-30 LAB — MAGNESIUM
Magnesium: 1.8 mg/dL (ref 1.7–2.4)
Magnesium: 1.4 mg/dL — ABNORMAL LOW (ref 1.7–2.4)

## 2018-01-30 LAB — VANCOMYCIN, RANDOM
VANCOMYCIN RM: 32
VANCOMYCIN RM: 23

## 2018-01-30 LAB — PROCALCITONIN: Procalcitonin: 19.53 ng/mL

## 2018-01-30 MED ORDER — HEPARIN SODIUM (PORCINE) 1000 UNIT/ML IJ SOLN: 1.3000 mL | INTRAMUSCULAR | Status: DC | PRN

## 2018-01-30 MED ORDER — HEPARIN SODIUM (PORCINE) 1000 UNIT/ML IJ SOLN: 5000.0000 mL | INTRAMUSCULAR | Status: DC | PRN

## 2018-01-30 MED ORDER — HEPARIN SODIUM (PORCINE) 5000 UNIT/ML IJ SOLN
INTRAMUSCULAR | Status: AC
  Administered 2018-01-30: 15000 [IU] via SUBCUTANEOUS
  Filled 2018-01-30: qty 1

## 2018-01-30 MED ORDER — INSULIN ASPART 100 UNIT/ML ~~LOC~~ SOLN
0.0000 [IU] | SUBCUTANEOUS | Status: DC
  Administered 2018-01-30 (×2): 3 [IU] via SUBCUTANEOUS
  Administered 2018-01-30: 7 [IU] via SUBCUTANEOUS
  Administered 2018-01-30 (×2): 4 [IU] via SUBCUTANEOUS
  Administered 2018-01-31 – 2018-02-01 (×2): 3 [IU] via SUBCUTANEOUS
  Administered 2018-02-01: 4 [IU] via SUBCUTANEOUS
  Administered 2018-02-02 (×3): 3 [IU] via SUBCUTANEOUS
  Administered 2018-02-04: 4 [IU] via SUBCUTANEOUS
  Administered 2018-02-05: 3 [IU] via SUBCUTANEOUS
  Administered 2018-02-05: 4 [IU] via SUBCUTANEOUS
  Administered 2018-02-06 – 2018-02-07 (×5): 3 [IU] via SUBCUTANEOUS
  Administered 2018-02-07: 4 [IU] via SUBCUTANEOUS
  Administered 2018-02-08 – 2018-02-09 (×7): 3 [IU] via SUBCUTANEOUS
  Filled 2018-01-30 (×26): qty 1

## 2018-01-30 MED ORDER — VANCOMYCIN HCL IN DEXTROSE 1-5 GM/200ML-% IV SOLN
1000.0000 mg | INTRAVENOUS | Status: DC
  Administered 2018-01-30 – 2018-02-01 (×3): 1000 mg via INTRAVENOUS
  Filled 2018-01-30 (×4): qty 200

## 2018-01-30 MED ORDER — PUREFLOW DIALYSIS SOLUTION
INTRAVENOUS | Status: DC
  Administered 2018-01-30: 2500 mL via INTRAVENOUS_CENTRAL
  Administered 2018-01-31 – 2018-02-01 (×4): 3 via INTRAVENOUS_CENTRAL
  Administered 2018-02-01: 2500 mL via INTRAVENOUS_CENTRAL

## 2018-01-30 MED ORDER — SODIUM CHLORIDE 0.9 % IV SOLN
10.0000 mg | Freq: Two times a day (BID) | INTRAVENOUS | Status: DC
  Filled 2018-01-30 (×2): qty 1

## 2018-01-30 MED ORDER — NOREPINEPHRINE 16 MG/250ML-% IV SOLN
0.0000 ug/min | INTRAVENOUS | Status: DC
  Administered 2018-01-30: 25 ug/min via INTRAVENOUS
  Administered 2018-01-30: 35 ug/min via INTRAVENOUS
  Filled 2018-01-30 (×2): qty 250

## 2018-01-30 MED ORDER — HEPARIN SODIUM (PORCINE) 1000 UNIT/ML DIALYSIS
1000.0000 [IU] | INTRAMUSCULAR | Status: DC | PRN
  Administered 2018-01-31: 1000 [IU] via INTRAVENOUS_CENTRAL
  Administered 2018-02-02: 2600 [IU] via INTRAVENOUS_CENTRAL
  Filled 2018-01-30: qty 3
  Filled 2018-01-30 (×5): qty 6

## 2018-01-30 MED ORDER — HEPARIN SODIUM (PORCINE) 1000 UNIT/ML DIALYSIS
1000.0000 [IU] | INTRAMUSCULAR | Status: DC | PRN
  Filled 2018-01-30: qty 6

## 2018-01-30 MED ORDER — HEPARIN SODIUM (PORCINE) 1000 UNIT/ML IJ SOLN: 5000.0000 [IU] | INTRAMUSCULAR | Status: DC | PRN

## 2018-01-30 MED ORDER — VANCOMYCIN HCL 10 G IV SOLR: 1250.0000 mg | INTRAVENOUS | Status: DC | PRN

## 2018-01-30 MED ORDER — NOREPINEPHRINE BITARTRATE 1 MG/ML IV SOLN
0.0000 ug/min | INTRAVENOUS | Status: DC
  Administered 2018-01-30: 15 ug/min via INTRAVENOUS
  Administered 2018-01-30: 20 ug/min via INTRAVENOUS
  Filled 2018-01-30: qty 16

## 2018-01-30 MED ORDER — HEPARIN SODIUM (PORCINE) 5000 UNIT/ML IJ SOLN
5000.0000 [IU] | Freq: Three times a day (TID) | INTRAMUSCULAR | Status: DC
  Administered 2018-01-30: 15000 [IU] via SUBCUTANEOUS
  Administered 2018-01-30 – 2018-02-09 (×29): 5000 [IU] via SUBCUTANEOUS
  Filled 2018-01-30 (×29): qty 1

## 2018-01-30 MED ORDER — PUREFLOW DIALYSIS SOLUTION: INTRAVENOUS | Status: DC

## 2018-01-30 MED ORDER — FAMOTIDINE IN NACL 20-0.9 MG/50ML-% IV SOLN
20.0000 mg | Freq: Every day | INTRAVENOUS | Status: DC
  Administered 2018-01-30 – 2018-02-05 (×7): 20 mg via INTRAVENOUS
  Filled 2018-01-30 (×7): qty 50

## 2018-01-30 MED ORDER — MAGNESIUM SULFATE 2 GM/50ML IV SOLN
2.0000 g | Freq: Once | INTRAVENOUS | Status: AC
  Administered 2018-01-30: 2 g via INTRAVENOUS
  Filled 2018-01-30: qty 50

## 2018-01-30 NOTE — Progress Notes (Signed)
[facility] [place] NAME: [name]    MR#:  [id number]  DATE OF BIRTH:  [birth year]  SUBJECTIVE:   Remains intubated but not sedated.  Has been warmed having some myoclonic jerks and twitching, but not having any evidence of brainstem function presently.  REVIEW OF SYSTEMS:    Review of Systems  Unable to perform ROS: Intubated    Nutrition: NPO Tolerating Diet: No  DRUG ALLERGIES:   Allergies  Allergen Reactions  . Enalapril Maleate Anaphylaxis  . Iodinated Diagnostic Agents Itching  . Isosorbide Mononitrate Er [Isosorbide Dinitrate] Other (See Comments)    "made me not be able to think or function"  . Canagliflozin Other (See Comments)    VITALS:  Blood pressure 105/78, pulse (!) 107, temperature 98.6 F (37 C), resp. rate (!) 27, height 6\' 1"  (1.854 m), weight 118.1 kg (260 lb 5.8 oz), SpO2 97 %.  PHYSICAL EXAMINATION:   Physical Exam  GENERAL:  57 y.o.-year-old patient lying in bed sedated & Intubated.  EYES: PERRL but minimally responsive.  HEENT: Head atraumatic, normocephalic. ET and OG tubes in place.   NECK:  Supple, no jugular venous distention. No thyroid enlargement, no tenderness.  LUNGS: Normal breath sounds bilaterally, no wheezing, rales, rhonchi. No use of accessory muscles of respiration.  CARDIOVASCULAR: S1, S2 normal. No murmurs, rubs, or gallops.  ABDOMEN: Soft, nontender, nondistended. Bowel sounds present. No organomegaly or mass.  EXTREMITIES: No cyanosis, clubbing or edema b/l.    NEUROLOGIC: Sedated & Intubated and having some myoclonic jerks and twitching. PSYCHIATRIC: Sedated & Intubated.   SKIN: No obvious rash, lesion, or ulcer.    LABORATORY PANEL:   CBC Recent Labs  Lab 01/29/18 0210  WBC 25.9*  HGB 10.3*  HCT 30.1*  PLT 113*   ------------------------------------------------------------------------------------------------------------------  Chemistries  Recent Labs  Lab  01/28/18 0433  01/29/18 0210  01/30/18 0850  NA 141   < > 139   < > 136  K 4.7   < > 3.1*   < > 4.6  CL 105   < > 109   < > 102  CO2 27   < > 20*   < > 20*  GLUCOSE 333*   < > 167*   < > 225*  BUN 23*   < > 33*   < > 46*  CREATININE 1.77*   < > 2.39*   < > 4.31*  CALCIUM 9.1   < > 7.8*   < > 7.3*  MG  --    < > 2.0  --   --   AST 59*  --   --   --   --   ALT 36  --   --   --   --   ALKPHOS 54  --   --   --   --   BILITOT 1.0  --   --   --   --    < > = values in this interval not displayed.   ------------------------------------------------------------------------------------------------------------------  Cardiac Enzymes Recent Labs  Lab 01/29/18 0210  TROPONINI 1.35*   ------------------------------------------------------------------------------------------------------------------  RADIOLOGY:  Ct Head Wo Contrast  Result Date: 01/30/2018 CLINICAL DATA:  Anoxic brain injury EXAM: CT HEAD WITHOUT CONTRAST TECHNIQUE: Contiguous axial images were obtained from the base of the skull through the vertex without intravenous contrast. COMPARISON:  Head CT 01/28/2018 FINDINGS: Brain: There is diffuse blurring of the gray-white interface, slightly greater than on the prior  examination. There is crowding of the basal cisterns. No acute hemorrhage or other extra-axial collection. Vascular: No abnormal hyperdensity of the major intracranial arteries or dural venous sinuses. No intracranial atherosclerosis. Skull: The visualized skull base, calvarium and extracranial soft tissues are normal. Sinuses/Orbits: No fluid levels or advanced mucosal thickening of the visualized paranasal sinuses. No mastoid or middle ear effusion. The orbits are normal. IMPRESSION: Diffuse blurring of the gray-white interface, compatible with global edema, slightly progressed compared to 01/28/2018. Mild worsening of basal cisternal crowding. Electronically Signed   By: Ulyses Jarred M.D.   On: 01/30/2018 13:17   Dg  Chest Port 1 View  Result Date: 01/30/2018 CLINICAL DATA:  57 y.o. male.with a history of type 2 diabetes hypertension and sleep apnea who was brought to the ED by his spouse in cardiac and respiratory arrest. now on vent, smoker EXAM: PORTABLE CHEST 1 VIEW COMPARISON:  Chest x-rays dated 01/28/2018 and 01/24/2018. FINDINGS: Study is hypoinspiratory. There is continued bilateral pulmonary edema pattern, not significantly changed compared to the most recent study of 01/28/2018, increased compared to the earlier study of 01/24/2018. Given the low lung volumes, heart size and mediastinal contours appear stable. Endotracheal tube tip is at the level of the clavicles, approximately 6 cm above the carina. LEFT jugular vein central line is stable in position with tip at the level of the upper SVC. No pneumothorax seen. Probable small bilateral pleural effusions. IMPRESSION: 1. Continued bilateral pulmonary edema pattern, not significantly changed compared to most recent chest x-ray of 01/28/2018. 2. Probable small bilateral pleural effusions. 3. Cardiomegaly. 4. Support apparatus appears stable in position. Endotracheal tube tip is at the level of the clavicles, approximately 6 cm above the carina. Electronically Signed   By: Franki Cabot M.D.   On: 01/30/2018 09:37     ASSESSMENT AND PLAN:   57 year old male with past medical history of ischemic cardiomyopathy ejection fraction of 20 to 12%, chronic systolic CHF, Obstructive sleep apnea, hypertension, diabetes who presented to the hospital as he was found unresponsive and noted to be in pulseless cardiac arrest.  1.  Pulseless cardiac arrest-as a result of worsening respiratory failure and flash pulmonary edema. - Patient was resuscitated in the ER for about 17 minutes prior to regaining spontaneous circulation. - No further arrhythmias, patient has been warmed back up and not showing any signs of meaningful neurological recovery presently.  2.  Acute  kidney injury with oliguria-secondary to underlying cardiac arrest and cardiorenal hemodynamics. -Continue IV fluids, Levophed.   - Patient's creatinine has progressively gotten worse.  He continues to be an uric/oliguric.  Nephrology has been consulted.  Possible consideration for CRRT.  3.  Altered mental status/encephalopathy-suspected to be secondary to underlying anoxic brain injury. - Neurology was consulted, they have explained to the patient's wife about patient's poor prognosis and unlikely that he would recover from this.   -Patient continues to have myoclonic jerking movements/twitching consistent with anoxic brain injury.  Patient has no brainstem reflexes and as per neurology prognosis is very poor and grim chance of recovery.  Patient's family is still hopeful.   --Continue Depakote, repeat CT head showing global edema, awaiting EEG and further neurology recommendations.  4.  Sepsis/septic shock-suspected but source remains unclear.  Continue empiric vancomycin, cefepime. -Continue vasopressors, follow cultures which are (-) so far.   5.  History of ischemic cardiomyopathy with ejection fraction of 20 to 25%- patient does not appear to be volume overloaded presently.   6.  Leukocytosis-secondary  to suspected sepsis.  Follow with IV antibiotic therapy.  Patient has a very poor prognosis given his multiple comorbidities and now with multiorgan failure.  Consider palliative care consult to discuss goals of care.   All the records are reviewed and case discussed with Care Management/Social Worker. Management plans discussed with the patient, family and they are in agreement.  CODE STATUS: Full code  DVT Prophylaxis: Lovenox  TOTAL TIME TAKING CARE OF THIS PATIENT: 30 minutes.   POSSIBLE D/C IN unclear, DEPENDING ON CLINICAL CONDITION and course   Henreitta Leber M.D on 01/30/2018 at 2:50 PM  Between 7am to 6pm - Pager - [phone]  After 6pm go to [email or web address] -  password [email or web address] [facility]  Office  [phone]  CC: Primary care physician; Marguerita Merles, MD

## 2018-01-30 NOTE — Procedures (Signed)
ELECTROENCEPHALOGRAM REPORT   Patient: [name]       Room #: IC14A-AA EEG No. ID: [id number] Age: 57 y.o.        Sex: male Referring Physician: Tukov-Yual Report Date:  01/30/2018        Interpreting Physician: Alexis Goodell  History: [name] is an 57 y.o. male with altered mental status after cardiorespiratory arrest  Medications:  Maxipime, Pepcid, Insulin, Fentanyl, Versed, Levophed, Propofol, Pitressin  Conditions of Recording:  This is a 21 channel routine scalp EEG performed with bipolar and monopolar montages arranged in accordance to the international 10/20 system of electrode placement. One channel was dedicated to EKG recording.  The patient is in the intubated and sedated state.  Description:  The background activity is markedly attenuated and unable to be appreciated at the standard sensitivity of 7uV/mm.  Despite increase in the sensitivity to 3uV/mm there continues to be no appreciation of a background rhythm.  Only background artifact can be appreciated.  No epileptiform activity is noted.   Hyperventilation and intermittent photic stimulation were not performed.   IMPRESSION: This is an abnormal electroencephalogram secondary to a markedly attenuated background rhythm.  Although this may be seen with anoxic brain injury, can not rule out the possibility of medication effect.  No epileptiform activity is noted.     Alexis Goodell, MD Neurology [phone] 01/30/2018, 5:39 PM

## 2018-01-30 NOTE — Progress Notes (Signed)
Patient ID: [name], male   DOB: 1961/01/18, 57 y.o.   MRN: [id number] Pulmonary/critical care  Discussion with patient's family member. Described the situation, status post cardiac arrest, anoxic brain injury, cerebral edema noted on CT scan. Pending EEG and appreciate neurology's evaluation and input. Patient with acute renal failure. She wishes [place] to proceed with hemodialysis.  Hermelinda Dellen, D.O.

## 2018-01-30 NOTE — Progress Notes (Signed)
Follow up - Critical Care Medicine Note  Patient Details:    [name] is an 57 y.o. male.with a history of type 2 diabetes hypertension and sleep apnea who was brought to the ED by his spouse in cardiac and respiratory arrest.    Lines, Airways, Drains: Airway 7 mm (Active)  Secured at (cm) 24 cm 01/29/2018  8:04 AM  Measured From Lips 01/29/2018  8:04 AM  [place] 01/29/2018  8:04 AM  Secured By [employer] 01/29/2018  8:04 AM  Tube Holder Repositioned Yes 01/29/2018  3:38 AM  Cuff Pressure (cm H2O) 25 cm H2O 01/29/2018  8:04 AM  Site Condition Dry 01/29/2018  8:04 AM     CVC Triple Lumen 01/28/18 Left Internal jugular (Active)  Indication for Insertion or Continuance of Line Vasoactive infusions 01/29/2018  8:00 AM  Site Assessment Clean;Dry;Intact 01/29/2018  8:00 AM  Proximal Lumen Status Infusing;Flushed;Blood return noted 01/29/2018  8:00 AM  Medial Lumen Status Infusing;Flushed;Blood return noted 01/29/2018  8:00 AM  Distal Lumen Status Infusing;Flushed;Blood return noted 01/29/2018  8:00 AM  Dressing Type Transparent;Occlusive 01/29/2018  8:00 AM  Dressing Status Clean;Dry;Intact;Antimicrobial disc in place 01/29/2018  8:00 AM  Line Care Connections checked and tightened;Zeroed and calibrated;Leveled 01/29/2018  8:00 AM  Dressing Intervention New dressing 01/28/2018  4:00 AM  Dressing Change Due 02/04/18 01/29/2018  8:00 AM     Arterial Line 01/28/18 Right Radial (Active)  Site Assessment Clean;Dry;Intact 01/29/2018  8:00 AM  Line Status Pulsatile blood flow;Positional 01/29/2018  8:00 AM  Art Line Waveform Appropriate 01/29/2018  8:00 AM  Art Line Interventions Zeroed and calibrated 01/29/2018  8:00 AM  Color/Movement/Sensation Capillary refill less than 3 sec 01/29/2018  8:00 AM  Dressing Type Transparent;Occlusive 01/29/2018  8:00 AM  Dressing Status Clean;Intact;Dry;Antimicrobial disc in place 01/29/2018  8:00 AM  Interventions New dressing;Antimicrobial disc  changed 01/28/2018  4:00 AM  Dressing Change Due 02/04/18 01/29/2018  8:00 AM     Urethral Catheter THAHN, ed tech Straight-tip;Temperature probe 16 Fr. (Active)  Indication for Insertion or Continuance of Catheter Unstable critical patients (first 24-48 hours) 01/29/2018  8:00 AM  Site Assessment Clean;Intact;Dry 01/29/2018  8:00 AM  Catheter Maintenance Bag below level of bladder;Catheter secured;Drainage bag/tubing not touching floor 01/29/2018  8:00 AM  Collection Container Standard drainage bag 01/29/2018  8:00 AM  Securement Method Other (Comment) 01/29/2018  8:00 AM  Output (mL) 0 mL 01/29/2018  8:00 AM    Anti-infectives:  Anti-infectives (From admission, onward)   Start     Dose/Rate Route Frequency Ordered Stop   01/30/18 0500  vancomycin (VANCOCIN) 1,250 mg in sodium chloride 0.9 % 250 mL IVPB  Status:  Discontinued     1,250 mg 166.7 mL/hr over 90 Minutes Intravenous Every 18 hours 01/29/18 0956 01/30/18 0308   01/30/18 0307  vancomycin (VANCOCIN) 1,250 mg in sodium chloride 0.9 % 250 mL IVPB     1,250 mg 166.7 mL/hr over 90 Minutes Intravenous As needed 01/30/18 0308     01/28/18 1400  vancomycin (VANCOCIN) 1,250 mg in sodium chloride 0.9 % 250 mL IVPB  Status:  Discontinued     1,250 mg 166.7 mL/hr over 90 Minutes Intravenous Every 18 hours 01/28/18 0544 01/29/18 0956   01/28/18 0600  ceFEPIme (MAXIPIME) 2 g in sodium chloride 0.9 % 100 mL IVPB     2 g 200 mL/hr over 30 Minutes Intravenous Every 12 hours 01/28/18 0544     01/28/18 0545  vancomycin (VANCOCIN)  1,250 mg in sodium chloride 0.9 % 250 mL IVPB     1,250 mg 166.7 mL/hr over 90 Minutes Intravenous  Once 01/28/18 0544 01/28/18 1020      Microbiology: Results for orders placed or performed during the hospital encounter of 01/15/2018  MRSA PCR Screening     Status: None   Collection Time: 01/28/18  5:00 AM  Result Value Ref Range Status   MRSA by PCR NEGATIVE NEGATIVE Final    Comment:        The GeneXpert MRSA Assay  (FDA approved for NASAL specimens only), is one component of a comprehensive MRSA colonization surveillance program. It is not intended to diagnose MRSA infection nor to guide or monitor treatment for MRSA infections. Performed at [facility], [address] [place], [place] [address]   Urine Culture     Status: None   Collection Time: 01/28/18  6:16 AM  Result Value Ref Range Status   Specimen Description   Final    URINE, RANDOM Performed at [facility], [address], [place], [place] [address]    Special Requests   Final    NONE Performed at [facility], [address], [place], [place] [address]    Culture   Final    NO GROWTH Performed at [facility], [place] [address] [place], [place] [address]    Report Status 01/29/2018 FINAL  Final  Culture, blood (Routine X 2) w Reflex to ID Panel     Status: None (Preliminary result)   Collection Time: 01/28/18  6:45 AM  Result Value Ref Range Status   Specimen Description BLOOD LAC  Final   Special Requests   Final    BOTTLES DRAWN AEROBIC AND ANAEROBIC Blood Culture adequate volume   Culture   Final    NO GROWTH 2 DAYS Performed at [facility], [address], [place], [place] [address]    Report Status PENDING  Incomplete  Culture, blood (Routine X 2) w Reflex to ID Panel     Status: None (Preliminary result)   Collection Time: 01/28/18  6:56 AM  Result Value Ref Range Status   Specimen Description BLOOD RAC  Final   Special Requests   Final    BOTTLES DRAWN AEROBIC AND ANAEROBIC Blood Culture results may not be optimal due to an inadequate volume of blood received in culture bottles   Culture   Final    NO GROWTH 2 DAYS Performed at [facility], [address] [place], [place] [address]    Report Status PENDING  Incomplete  Culture, respiratory (non-expectorated)     Status: None (Preliminary result)   Collection Time: 01/28/18 11:24 AM  Result Value Ref  Range Status   Specimen Description   Final    TRACHEAL ASPIRATE Performed at [facility], [address] [place], [place] [address]    Special Requests   Final    NONE Performed at [facility], [place] [place], [place] [address]    Gram Stain   Final    RARE WBC PRESENT,BOTH PMN AND MONONUCLEAR RARE GRAM POSITIVE COCCI IN PAIRS    Culture   Final    RARE Consistent with normal respiratory flora. Performed at [facility], [place] [address] [place], [place] [address]    Report Status PENDING  Incomplete   Events:   Studies: Dg Abd 1 View  Result Date: 01/28/2018 CLINICAL DATA:  NG tube placement EXAM:  ABDOMEN - 1 VIEW COMPARISON:  01/20/2018 FINDINGS: No enteric tube is demonstrated within the field of view. Mild gaseous distention of the stomach. IMPRESSION: No enteric tube visualized. Electronically Signed   By: Lucienne Capers M.D.   On: 01/28/2018 05:53   Dg Abd 1 View  Result Date: 01/20/2018 CLINICAL DATA:  Encounter for NG tube placement EXAM: ABDOMEN - 1 VIEW COMPARISON:  01/20/2018 FINDINGS: Malpositioned nasogastric tube, coiled backwards overlying the mid esophagus with tip excluded from view Endotracheal tube overlies the tracheal air column with tip superior to the carina. No pneumothorax. Pacer leads overlie the left ventricular apex. Pulmonary vasculature remains indistinct with cephalization of flow. Suspected trace right-sided pleural effusion. No definite left-sided pleural effusion, though the left costophrenic angle is excluded from view. Nonobstructive bowel gas pattern. No pneumoperitoneum, pneumatosis or portal venous gas. IMPRESSION: 1. Malpositioned nasogastric tube.  Repositioning is advised. 2. Similar findings of pulmonary edema and trace right-sided pleural effusion. 3. Nonobstructive bowel gas pattern. Electronically Signed   By: Sandi Mariscal M.D.   On: 01/20/2018 14:03   Dg Abd 1 View  Result Date: 01/20/2018 CLINICAL  DATA:  NG tube placement EXAM: ABDOMEN - 1 VIEW COMPARISON:  None. FINDINGS: There is no nasogastric tube identified in the lower chest or abdomen. There is mild gaseous distension of the colon and stomach. There is no evidence of pneumoperitoneum, portal venous gas or pneumatosis. There are no pathologic calcifications along the expected course of the ureters. The osseous structures are unremarkable. IMPRESSION: No nasogastric tube identified in the lower chest or abdomen. Electronically Signed   By: Kathreen Devoid   On: 01/20/2018 12:42   Dg Abd 1 View  Result Date: 01/20/2018 CLINICAL DATA:  Abdominal pain. EXAM: ABDOMEN - 1 VIEW COMPARISON:  None. FINDINGS: A right-sided pleural effusion is suspected layering posteriorly. Pacer paddles are noted on the chest. The bowel gas pattern is unremarkable. Air and stool scattered throughout the colon. A few scattered air-filled small bowel loops. No obvious free air. A right femoral line is noted. Bony structures are unremarkable. IMPRESSION: Suspect right pleural effusion. No plain film findings for an acute abdominal process. Electronically Signed   By: Marijo Sanes M.D.   On: 01/20/2018 11:22   Ct Head Wo Contrast  Result Date: 01/28/2018 CLINICAL DATA:  Cardiac arrest. History of hypertension and diabetes. EXAM: CT HEAD WITHOUT CONTRAST TECHNIQUE: Contiguous axial images were obtained from the base of the skull through the vertex without intravenous contrast. COMPARISON:  CT neck January 20, 2018. FINDINGS: BRAIN: No intraparenchymal hemorrhage, mass effect nor midline shift. Mild blurring of the gray-white matter differentiation. Patchy supratentorial white matter hypodensities. The ventricles and sulci are normal. No acute large vascular territory infarcts. No abnormal extra-axial fluid collections. Basal cisterns are patent. VASCULAR: Mild calcific atherosclerosis carotid bifurcations. SKULL/SOFT TISSUES: No skull fracture. No significant soft tissue swelling.  ORBITS/SINUSES: The included ocular globes and orbital contents are normal.Mild paranasal sinus mucosal thickening without air-fluid levels. Included mastoid air cells are well aerated. OTHER: Life-support lines in place. IMPRESSION: 1. 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 2. Mild chronic small vessel ischemic changes. 3. Mild atherosclerosis. 4. Acute findings discussed with and reconfirmed by Dr.ALLISON WEBSTER on 01/28/2018 at 1:00 am. Electronically Signed   By: Elon Alas M.D.   On: 01/28/2018 01:04   Ct Soft Tissue Neck Wo Contrast  Result Date: 01/20/2018 CLINICAL DATA:  57 y/o  M; thyroid nodule. EXAM: CT NECK WITHOUT CONTRAST TECHNIQUE: Multidetector CT  imaging of the neck was performed following the standard protocol without intravenous contrast. COMPARISON:  None. FINDINGS: Pharynx and larynx: Debris within the airways from intubation. Salivary glands: No inflammation, mass, or stone. Thyroid: 5.6 cm nodule within the left lobe of the thyroid. 18 mm nodule in the right lobe of thyroid. Lymph nodes: None enlarged or abnormal density. Vascular: Negative. Limited intracranial: Negative. Visualized orbits: Negative. Mastoids and visualized paranasal sinuses: Mild mucosal thickening of the left maxillary sinus. Normal aeration of the mastoid air cells. Skeleton: No acute or aggressive process. Dental disease with multiple periapical cysts and absent crowns. Mild cervical spondylosis greatest at the C5-6 level. Upper chest: Endotracheal tube tip 3.4 cm above the carina. Moderate bilateral pleural effusions and dependent atelectasis of the lungs. Other: None. IMPRESSION: 1. Thyroid nodules measuring up to 5.6 cm in the left lobe of thyroid. Mass effect from the large left lobe of thyroid nodule displaces the airway rightward. 2. Endotracheal tube tip 3.4 cm above the carina. 3. Moderate bilateral pleural effusion with dependent atelectasis of the lungs. Electronically  Signed   By: Kristine Garbe M.D.   On: 01/20/2018 04:44   Dg Chest Port 1 View  Result Date: 01/28/2018 CLINICAL DATA:  Line placement EXAM: PORTABLE CHEST 1 VIEW COMPARISON:  01/28/2018 FINDINGS: Endotracheal tube measures 6.8 cm above the carina. A left central venous catheter has been placed with tip over the confluence of the brachiocephalic vein and IVC. Shallow inspiration. Cardiac enlargement. Bilateral perihilar infiltrates. Probable small right pleural effusion. No pneumothorax is visible. IMPRESSION: Appliances appear in satisfactory position. Cardiac enlargement with bilateral perihilar infiltrates. Small right pleural effusion. No pneumothorax. Electronically Signed   By: Lucienne Capers M.D.   On: 01/28/2018 05:52   Dg Chest Portable 1 View  Result Date: 01/28/2018 CLINICAL DATA:  Short of breath EXAM: PORTABLE CHEST 1 VIEW COMPARISON:  01/24/2018, 01/21/2018 FINDINGS: Interval intubation, tip of the endotracheal tube is about 4 cm superior to the carina. Worsening bilateral interstitial and alveolar disease. No large effusion. Stable mild cardiomegaly. No pneumothorax. IMPRESSION: 1. Endotracheal tube tip about 3.5 cm superior to the carina. 2. Cardiomegaly. Interim worsening of bilateral interstitial and alveolar airspace disease. Electronically Signed   By: Donavan Foil M.D.   On: 01/28/2018 00:29   Dg Chest Port 1 View  Result Date: 01/24/2018 CLINICAL DATA:  Respiratory failure EXAM: PORTABLE CHEST 1 VIEW COMPARISON:  01/21/2018, 01/20/2018, 12/13/2017 FINDINGS: Removal of the endotracheal tube. Slight increased upper lobe ground-glass opacity. Improved aeration at the bases. Stable slightly enlarged cardiomediastinal silhouette. No pneumothorax. IMPRESSION: 1. Removal of endotracheal tube 2. Improved aeration at the bilateral lung bases. Slight interval increase in bilateral upper lobe ground-glass opacity. 3. Stable mild cardiomegaly Electronically Signed   By: Donavan Foil M.D.   On: 01/24/2018 03:58   Dg Chest Port 1 View  Result Date: 01/21/2018 CLINICAL DATA:  Acute respiratory failure. EXAM: PORTABLE CHEST 1 VIEW COMPARISON:  01/20/2018 FINDINGS: The endotracheal tube is in good position at the mid tracheal level. The lungs demonstrate improved aeration with resolving edema and atelectasis. Probable persistent small layering right pleural effusion. IMPRESSION: Stable position of the endotracheal tube. Improved lung aeration with resolving edema and atelectasis. Electronically Signed   By: Marijo Sanes M.D.   On: 01/21/2018 08:36   Dg Chest Portable 1 View  Result Date: 01/20/2018 CLINICAL DATA:  57 y/o  M; status post intubation. EXAM: PORTABLE CHEST 1 VIEW COMPARISON:  01/20/2018 chest radiograph FINDINGS: Stable cardiomegaly  given projection and technique. Interstitial and alveolar pulmonary edema. Probable small effusions. Endotracheal tube tip 4.5 cm above the carina. Transcutaneous pacing pads noted. Bones are unremarkable. IMPRESSION: 1. Endotracheal tube tip 4.5 cm above the carina. 2. Stable cardiomegaly and pulmonary edema. Probable small effusions. Electronically Signed   By: Kristine Garbe M.D.   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 Stable cardiomegaly. Nonaneurysmal thoracic aorta. Diffuse bilateral airspace disease and vascular congestion consistent with pulmonary edema. No significant effusion or pneumothorax. No acute osseous abnormality. IMPRESSION: Stable cardiomegaly with pulmonary edema. Superimposed pneumonia is not entirely excluded but believed less likely. Electronically Signed   By: Ashley Royalty M.D.   On: 01/20/2018 02:56   Dg Addison Bailey G Tube Plc W/fl W/rad  Result Date: 01/20/2018 CLINICAL DATA:  Patient presents for Dobbhoff tube  placement. EXAM: NASO G TUBE PLACEMENT WITH FL AND WITH RAD FLUOROSCOPY TIME:  Fluoroscopy Time:  0.3 minute Radiation Exposure Index (if provided by the fluoroscopic device): 1.3 mGy Number of Acquired Spot Images: 0 COMPARISON:  None. FINDINGS: Multiple attempts at placing a Dobbhoff tube were made through the right and left nare. The tip of the Dobbhoff tube would not progressed beyond the level of the mid trachea on repeated attempts. Attempts were made at advancing the Dobbhoff tube following deflating the tracheostomy cuff, but the Dobbhoff tube could not advance any further. Examination was terminated at this time. IMPRESSION: 1. Multiple unsuccessful attempts at placing a Dobbhoff tube which would not progressed beyond the level of the mid trachea Electronically Signed   By: Kathreen Devoid   On: 01/20/2018 16:41    Consults: Treatment Team:  Teodoro Spray, MD Leotis Pain, MD   Subjective:    Overnight Issues: patient has completed rewarming, no clinical response, only activity noted are myoclonic jerks  Objective:  Vital signs for last 24 hours: Temp:  [91.4 F (33 C)-98.8 F (37.1 C)] 97.7 F (36.5 C) (07/29 0700) Pulse Rate:  [70-107] 93 (07/29 0700) Resp:  [22-31] 25 (07/29 0700) BP: (84-152)/(54-90) 113/75 (07/29 0700) SpO2:  [89 %-96 %] 96 % (07/29 0709) Arterial Line BP: (82-173)/(50-80) 111/69 (07/29 0700) FiO2 (%):  [40 %-60 %] 60 % (07/29 0709) Weight:  [260 lb 5.8 oz (118.1 kg)] 260 lb 5.8 oz (118.1 kg) (07/29 0406)  Hemodynamic parameters for last 24 hours: CVP:  [16 mmHg-28 mmHg] 28 mmHg  Intake/Output from previous day: 07/28 0701 - 07/29 0700 In: 5571.8 [I.V.:3650.9; IV Piggyback:1920.8] Out: 63 [Urine:63]  Intake/Output this shift: No intake/output data recorded.  Vent settings for last 24 hours: Vent Mode: PRVC FiO2 (%):  [40 %-60 %] 60 % Set Rate:  [25 bmp] 25 bmp Vt Set:  [500 mL] 500 mL PEEP:  [10 cmH20] 10 cmH20 Plateau Pressure:  [22  cmH20-26 cmH20] 26 cmH20  Physical Exam:  Vital signs: . Please see the above listed vital signs HEENT: Patient is orally intubated, trachea is midline, no accessory muscle utilization Cardiovascular: Regular rate and rhythm Pulmonary: Coarse rhonchi Abdominal: Hypoactive bowel sounds, soft exam, or tics on in place Extremities: No clubbing cyanosis or edema noted Neurologic: Patient with no pupillary response, no corneals noted, no gag appreciated and unresponsive. Only myoclonic jerking noted  Assessment/Plan:   Status post cardiac arrest. Patient status post targeted temperature management, concerning for  severe anoxic brain injury, will hold all sedation, pending neurologic evaluation concerning for brain death. Patient with underlying history of severely reduced ejection fraction. 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 Appreciate neurology's input. Discussions with family regarding outlining goals of care  History of severe cardiomyopathy with systolic failure. Measured ejection fraction of 15% on echocardiogram yesterday  Renal failure. Creatinine has increased to 4.05, BUN 45, has had issues with potassium overnight, dialysis catheter was placed, nephrology has been consulted.  We'll need to discuss with family goals of care in light of severe anoxic brain injury, pending neurologic evaluation and input  Limited prognosis Critical Care Total Time 40 minutes  Slade Pierpoint 01/30/2018  *Care during the described time interval was provided by me and/or other providers on the critical care team.  I have reviewed this patient's available data, including medical history, events of note, physical examination and test results as part of my evaluation. Patient ID: [name], male   DOB: [birth year], 57 y.o.   MRN: [id number]

## 2018-01-30 NOTE — Progress Notes (Signed)
   01/30/18 1025  Clinical Encounter Type  Visited With Patient  Visit Type Follow-up  Spiritual Encounters  Spiritual Needs Prayer   As unit chaplain was on call when patient was admitted, chaplain attempted follow up with family.  No family present so chaplain offered silent and energetic prayers for patient, family and care team.

## 2018-01-30 NOTE — Progress Notes (Addendum)
Pharmacy consult noted for CRRT dose adjustements.   1. Cefepime is dosed appropriately for CVVHD at 2 gm IV Q12H 2. Enoxaparin is changed to subcutaneous heparin 5000 units Q8H 3. Famotidine is decreased 50% to 10 mg IV Q12H 4. Vancomycin is usually dosed 10 to 15 mg/kg IV Q24H in CRRT. Check random level now and start vancomycin 1250 mg IV Q24H when concentration is less than 20 mcg/ml.   01/30/18 16:16 vancomycin random returns at 23 mcg/ml. Will not give dose now. Check vancomycin random tomorrow with AM labs and dose as appropriate for renal function.   Niko Penson A. [place], [place].D., BCPS Clinical Pharmacist 01/30/18 16:08

## 2018-01-30 NOTE — Progress Notes (Addendum)
Inpatient Diabetes Program Recommendations  AACE/ADA: New Consensus Statement on Inpatient Glycemic Control (2015)  Target Ranges:  Prepandial:   less than 140 mg/dL      Peak postprandial:   less than 180 mg/dL (1-2 hours)      Critically ill patients:  140 - 180 mg/dL   Results for JAYSION, RAMSEYER (MRN [id number]) as of 01/30/2018 08:16  Ref. Range 01/28/2018 23:59 01/29/2018 02:10 01/29/2018 03:17 01/29/2018 04:13 01/29/2018 05:20 01/29/2018 07:17 01/29/2018 11:36 01/29/2018 15:43 01/29/2018 19:48 01/30/2018 00:05 01/30/2018 03:55 01/30/2018 07:18  Glucose-Capillary Latest Ref Range: 70 - 99 mg/dL 176 (H)  IV Insulin Drip 159 (H)  IV Insulin Drip 137 (H)  IV Insulin Drip 143 (H)  IV Insulin Drip +  5 units LANTUS 115 (H)  IV Insulin Drip 115 (H) 173 (H)  3 units NOVOLOG  208 (H)  5 units NOVOLOG  238 (H)  5 units NOVOLOG  238 (H)  5 units NOVOLOG  260 (H)  8 units NOVOLOG  220 (H)   Admit: Cardiopulmonary arrest  History: DM  Home DM Meds: Metformin 2000 mg daily        Onglyza 5 mg daily  Current Orders: Lantus 5 units QHS       Novolog Resistant Correction Scale/ SSI (0-20 units) Q4 hours      Currently Intubated.  Requiring Vasopressor support.  Note patient transitioned off IV Insulin drip yesterday at 4am.  Lantus 5 units given prior to transition.  Of note, Insulin drip rates were quite high when patient was transitioned off the Insulin drip (>5 units per hour).     MD- Please consider the following if aggressive measures desired:  Start ICU Glycemic Control Protocol--Would start with Phase 2 IV Insulin drip and restart the Insulin drip since CBGs are >200 mg/dl  OR  Could try the following if you do not desire to start the ICU Glycemic Control Protocol:  Increase Lantus to 18 units daily (please start this AM)  (this would be 0.15 units/kg dosing based on weight of 118 kg for basal dosing)     --Will follow patient during  hospitalization--  Wyn Quaker RN, MSN, CDE Diabetes Coordinator Inpatient Glycemic Control Team Team Pager: [phone] (8a-5p)

## 2018-01-30 NOTE — Procedures (Signed)
Hemodialysis Catheter Insertion Procedure Note [name] [id number] 1961/04/12  Procedure: Insertion of Hemodialysis Catheter Indications: Dialysis Access   Procedure Details Consent: Risks of procedure as well as the alternatives and risks of each were explained to the (patient/caregiver).  Consent for procedure obtained. Time Out: Verified patient identification, verified procedure, site/side was marked, verified correct patient position, special equipment/implants available, medications/allergies/relevent history reviewed, required imaging and test results available.  Performed  Maximum sterile technique was used including antiseptics, cap, gloves, gown, hand hygiene, mask and sheet. Skin prep: Chlorhexidine; local anesthetic administered Double lumen hemodialysis catheter was inserted into right internal jugular vein using the Seldinger technique.  Evaluation Blood flow good Complications: No apparent complications Patient did tolerate procedure well. Chest X-ray ordered to verify placement.  CXR: pending.   Changed temporary dialysis catheter site to right internal jugular due to right femoral groin access problems and inability to proceed with CRRT. Placed right internal jugular temporary dual lumen dialysis catheter utilizing ultrasound no complications noted during or following procedure   Marda Stalker, [place] Pager [phone] (please enter 7 digits) [place] Pager [phone] (please enter 7 digits)

## 2018-01-30 NOTE — Procedures (Signed)
Right femoral HD Catheter Insertion Procedure Note  MOUA RASMUSSON [id number]  Dec 02, 1960  Procedure: Insertion of Central Venous Catheter Indications: Continuous renal replacement therapy  Procedure Details Consent: Unable to obtain consent because of emergent medical necessity. Time Out: Verified patient identification, verified procedure, site/side was marked, verified correct patient position, special equipment/implants available, medications/allergies/relevent history reviewed, required imaging and test results available.  Timeout performed  Maximum sterile technique was used including antiseptics, cap, gloves, gown, hand hygiene, mask and sheet. Skin prep: Chlorhexidine; local anesthetic administered A antimicrobial bonded/coated double lumen catheter was placed in the right femoral vein using the Seldinger technique.  Evaluation Blood flow good Complications: No apparent complications Patient did tolerate procedure well. Chest X-ray ordered to verify placement.  CXR: not indicated in femoral location. Procedure performed under direct supervision of Dr. Jefferson Fuel. Ultrasound utilized for realtime vessel cannulation   Magddalene S Tukov-Yual 01/30/2018, 3:39 PM

## 2018-01-30 NOTE — Progress Notes (Signed)
Pharmacy Antibiotic Note  [name] is a 57 y.o. male admitted on 01/22/2018 s/p cardiac arrest and underwent targeted temperature management. Patient requiring mechanical ventilation and is being initiated on CRRT. Pharmacy has been consulted for vancomycin and cefepime dosing for sepsis. Patient with recent history requiring mechanical ventilation and discharged on 7/23 - patient received both vancomycin and cefepime during admission.   Plan: Vancomycin level is below 25, target level for CRRT is 15-25. Will initiate patient on vancomycin 1000mg  IV Q24hr. Will obtain trough prior to 3rd dose of regimen.  Patient transitioned to cefepime 2g IV Q12hr. If patient remains on CRRT recommend transitioning patient to cefepime 1g IV Q8hr.   Height: 6\' 1"  (185.4 cm) Weight: 260 lb 5.8 oz (118.1 kg) IBW/kg (Calculated) : 79.9  Temp (24hrs), Avg:98.3 F (36.8 C), Min:95 F (35 C), Max:99 F (37.2 C)  Recent Labs  Lab 01/24/18 0910 01/28/18 0034 01/28/18 0430 01/28/18 0432  01/29/18 0210  01/30/18 0006 01/30/18 0355 01/30/18 0850 01/30/18 1036 01/30/18 1613 01/30/18 1616 01/30/18 2003  WBC 17.2* 22.5*  --  33.8*  --  25.9*  --   --   --   --   --   --   --   --   CREATININE 0.96 1.47*  --   --    < > 2.39*   < > 3.70* 4.05* 4.31*  --  4.70*  --  4.50*  LATICACIDVEN  --  8.7* 2.3*  --   --  3.5*  --   --  2.6*  --  1.8  --   --   --   VANCORANDOM  --   --   --   --   --   --   --  32  --   --   --   --  23  --    < > = values in this interval not displayed.    Estimated Creatinine Clearance: 24.4 mL/min (A) (by C-G formula based on SCr of 4.5 mg/dL (H)).    Allergies  Allergen Reactions  . Enalapril Maleate Anaphylaxis  . Iodinated Diagnostic Agents Itching  . Isosorbide Mononitrate Er [Isosorbide Dinitrate] Other (See Comments)    "made me not be able to think or function"  . Canagliflozin Other (See Comments)    Antimicrobials this admission: Cefepime 7/19 >> 7/23, 7/27  >> Vancomycin 7/19 >> 7/22, 7/27 >>   Dose adjustments this admission: 7/29 vancomycin held due to vancomycin level of 32 7/29 CRRT initiated. Patients cefepime and vancomycin adjusted  Microbiology results: 7/27 BCx: no growth x 2 days  7/27 UCx: no growth  7/27 Sputum: normal flora   7/27 MRSA PCR: negative   Thank you for allowing pharmacy to be a part of this patient's care.  Simpson,Michael L 01/30/2018 8:44 PM

## 2018-01-30 NOTE — Progress Notes (Signed)
Subjective: Patient remains intubated.  Warmed as of 1800 last evening.  Last Propofol at 0600 today.  Patient remains unresponsive.    Objective: Current vital signs: BP 122/80   Pulse (!) 103   Temp 98.4 F (36.9 C)   Resp (!) 33   Ht _0  (1.854 m)   Wt 118.1 kg (260 lb 5.8 oz)   SpO2 95%   BMI 34.35 kg/m  Vital signs in last 24 hours: Temp:  [92.1 F (33.4 C)-98.8 F (37.1 C)] 98.4 F (36.9 C) (07/29 1000) Pulse Rate:  [74-107] 103 (07/29 1000) Resp:  [22-33] 33 (07/29 1000) BP: (84-152)/(54-90) 122/80 (07/29 1000) SpO2:  [89 %-96 %] 95 % (07/29 1000) Arterial Line BP: (82-173)/(50-80) 129/71 (07/29 1000) FiO2 (%):  [40 %-60 %] 60 % (07/29 0800) Weight:  [118.1 kg (260 lb 5.8 oz)] 118.1 kg (260 lb 5.8 oz) (07/29 0406)  Intake/Output from previous day: 07/28 0701 - 07/29 0700 In: 5571.8 [I.V.:3650.9; IV Piggyback:1920.8] Out: 63 [Urine:63] Intake/Output this shift: Total I/O In: 451.9 [I.V.:451.9] Out: 0  Nutritional status:  Diet Order           Diet NPO time specified  Diet effective now          Neurologic Exam: Mental Status: Patient does not respond to verbal stimuli.  Does not respond to deep sternal rub.  Does not follow commands.  No verbalizations noted.  Cranial Nerves: II: patient does not respond confrontation bilaterally, pupils right 2 mm, left 2 mm,and unreactive on the right, minimally reactive on the left III,IV,VI: doll's response absent bilaterally. Cold caloric responses absent bilaterally. V,VII: corneal reflex reduced on the right, and absent on the left  VIII: patient does not respond to verbal stimuli IX,X: gag reflex absent, XI: trapezius strength unable to test bilaterally XII: tongue strength unable to test Motor: Extremities flaccid throughout.  No purposeful movements noted.  Tremor noted of the toes bilaterally Sensory: Does not respond to noxious stimuli in any extremity. Deep Tendon Reflexes:  1+ at the biceps, absent  otherwise. Plantars: Mute bilaterally Cerebellar: Unable to perform    Lab Results: Basic Metabolic Panel: Recent Labs  Lab 01/28/18 0432  01/28/18 1245  01/29/18 0210  01/29/18 1600 01/29/18 1944 01/29/18 2053 01/30/18 0006 01/30/18 0355 01/30/18 0850  NA  --    < > 141   < > 139   < > 136 136  --  136 135 136  K  --    < > 4.4   < > 3.1*   < > 5.0 5.9* 5.7* 5.0 4.8 4.6  CL  --    < > 108   < > 109   < > 107 106  --  104 104 102  CO2  --    < > 23   < > 20*   < > 18* 17*  --  18* 17* 20*  GLUCOSE  --    < > 340*   < > 167*   < > 223* 249*  --  312* 259* 225*  BUN  --    < > 26*   < > 33*   < > 40* 39*  --  43* 45* 46*  CREATININE  --    < > 2.12*   < > 2.39*   < > 3.09* 3.45*  --  3.70* 4.05* 4.31*  CALCIUM  --    < > 8.4*   < > 7.8*   < >  7.1* 7.1*  --  7.2* 7.4* 7.3*  MG 2.2  --  2.1  --  2.0  --   --   --   --   --   --   --   PHOS 8.2*  --   --   --  5.5*  --   --   --   --   --   --   --    < > = values in this interval not displayed.    Liver Function Tests: Recent Labs  Lab 01/28/18 0034 01/28/18 0433  AST 58* 59*  ALT 31 36  ALKPHOS 49 54  BILITOT 0.5 1.0  PROT 5.8* 6.7  ALBUMIN 2.2* 2.6*   No results for input(s): LIPASE, AMYLASE in the last 168 hours. No results for input(s): AMMONIA in the last 168 hours.  CBC: Recent Labs  Lab 01/24/18 0910 01/28/18 0034 01/28/18 0432 01/29/18 0210  WBC 17.2* 22.5* 33.8* 25.9*  NEUTROABS 10.6*  --   --   --   HGB 12.1* 10.3* 11.9* 10.3*  HCT 36.0* 30.9* 34.5* 30.1*  MCV 93.4 96.2 93.5 92.2  PLT 233 288 268 113*    Cardiac Enzymes: Recent Labs  Lab 01/28/18 0433 01/28/18 0816 01/28/18 1440 01/28/18 2151 01/29/18 0210 01/30/18 0006  CKTOTAL  --   --   --   --   --  385  TROPONINI 0.50* 0.90* 1.17* 1.36* 1.35*  --     Lipid Panel: Recent Labs  Lab 01/28/18 0433  TRIG 222*    CBG: Recent Labs  Lab 01/29/18 1543 01/29/18 1948 01/30/18 0005 01/30/18 0355 01/30/18 0718  GLUCAP 208* 238*  2* 62* 69*    Microbiology: Results for orders placed or performed during the hospital encounter of 01/17/2018  MRSA PCR Screening     Status: None   Collection Time: 01/28/18  5:00 AM  Result Value Ref Range Status   MRSA by PCR NEGATIVE NEGATIVE Final    Comment:        The GeneXpert MRSA Assay (FDA approved for NASAL specimens only), is one component of a comprehensive MRSA colonization surveillance program. It is not intended to diagnose MRSA infection nor to guide or monitor treatment for MRSA infections. Performed at [facility], [address], [place], [place] [address]   Urine Culture     Status: None   Collection Time: 01/28/18  6:16 AM  Result Value Ref Range Status   Specimen Description   Final    URINE, RANDOM Performed at [facility], [address] [place], [place] [address]    Special Requests   Final    NONE Performed at [facility], [address], [place], [place] [address]    Culture   Final    NO GROWTH Performed at [facility], [place] [address], [place], [place] [address]    Report Status 01/29/2018 FINAL  Final  Culture, blood (Routine X 2) w Reflex to ID Panel     Status: None (Preliminary result)   Collection Time: 01/28/18  6:45 AM  Result Value Ref Range Status   Specimen Description BLOOD LAC  Final   Special Requests   Final    BOTTLES DRAWN AEROBIC AND ANAEROBIC Blood Culture adequate volume   Culture   Final    NO GROWTH 2 DAYS Performed at [facility], [address] [place],  [address]    Report Status PENDING  Incomplete  Culture,  blood (Routine X 2) w Reflex to ID Panel     Status: None (Preliminary result)   Collection Time: 01/28/18  6:56 AM  Result Value Ref Range Status   Specimen Description BLOOD RAC  Final   Special Requests   Final    BOTTLES DRAWN AEROBIC AND ANAEROBIC Blood Culture results may not be optimal due to an inadequate volume of blood received in  culture bottles   Culture   Final    NO GROWTH 2 DAYS Performed at [facility], [address] [place], [place] [address]    Report Status PENDING  Incomplete  Culture, respiratory (non-expectorated)     Status: None (Preliminary result)   Collection Time: 01/28/18 11:24 AM  Result Value Ref Range Status   Specimen Description   Final    TRACHEAL ASPIRATE Performed at [facility], [address] [place], [place] [address]    Special Requests   Final    NONE Performed at [facility], [address] [place], [place] [address]    Gram Stain   Final    RARE WBC PRESENT,BOTH PMN AND MONONUCLEAR RARE GRAM POSITIVE COCCI IN PAIRS    Culture   Final    RARE Consistent with normal respiratory flora. Performed at [facility], [place] [address], [place], [place] [address]    Report Status PENDING  Incomplete    Coagulation Studies: Recent Labs    01/28/18 0433 01/28/18 1145  LABPROT 16.0* 16.7*  INR 1.29 1.36    Imaging: Dg Chest Port 1 View  Result Date: 01/30/2018 CLINICAL DATA:  57 y.o. male.with a history of type 2 diabetes hypertension and sleep apnea who was brought to the ED by his spouse in cardiac and respiratory arrest. now on vent, smoker EXAM: PORTABLE CHEST 1 VIEW COMPARISON:  Chest x-rays dated 01/28/2018 and 01/24/2018. FINDINGS: Study is hypoinspiratory. There is continued bilateral pulmonary edema pattern, not significantly changed compared to the most recent study of 01/28/2018, increased compared to the earlier study of 01/24/2018. Given the low lung volumes, heart size and mediastinal contours appear stable. Endotracheal tube tip is at the level of the clavicles, approximately 6 cm above the carina. LEFT jugular vein central line is stable in position with tip at the level of the upper SVC. No pneumothorax seen. Probable small bilateral pleural effusions. IMPRESSION: 1. Continued bilateral pulmonary edema pattern, not  significantly changed compared to most recent chest x-ray of 01/28/2018. 2. Probable small bilateral pleural effusions. 3. Cardiomegaly. 4. Support apparatus appears stable in position. Endotracheal tube tip is at the level of the clavicles, approximately 6 cm above the carina. Electronically Signed   By: Franki Cabot M.D.   On: 01/30/2018 09:37    Medications:  I have reviewed the patient's current medications. Scheduled: . artificial tears  1 application Both Eyes B8G  . chlorhexidine gluconate (MEDLINE KIT)  15 mL Mouth Rinse BID  . cisatracurium  0.1 mg/kg Intravenous Once  . enoxaparin (LOVENOX) injection  40 mg Subcutaneous Q24H  . insulin aspart  0-20 Units Subcutaneous Q4H  . insulin glargine  5 Units Subcutaneous QHS  . ipratropium-albuterol  3 mL Nebulization Q6H  . mouth rinse  15 mL Mouth Rinse 10 times per day  . sodium chloride flush  10-40 mL Intracatheter Q12H    Assessment/Plan: Patient remains intubated and unresponsive.  Warmed as of 1800 last evening.  Last Propofol at 0600 today.  Tremors noted in the lower extremities.  BUN/Cr increased above baseline.  Was given one dose of Valproate on yesterday.  Does not currently fulfill clinical criteria for brain death.  Initial head CT image was concerning for early signs of anoxic brain injury.  Further work up recommended to look for evidence of extensive brain injury.    Recommendations: 1. Repeat head CT without contrast 2. EEG 3. Continue holding sedation    LOS: 2 days   Alexis Goodell, MD Neurology [phone] 01/30/2018  10:45 AM

## 2018-01-30 NOTE — Progress Notes (Signed)
RT assisted with patient transport to CT and returned to ICU. No issues to report.

## 2018-01-30 NOTE — Consult Note (Signed)
[facility] CONSULT NOTE    Date: 01/30/2018                  Patient Name:  [name]  MRN: [id number]  DOB: 1960/12/31  Age / Sex: 57 y.o., male         PCP: Marguerita Merles, MD                 Service Requesting Consult: Critical Care                 Reason for Consult: Acute renal failure            History of Present Illness: Patient is a 57 y.o. male with a PMHx of diabetes mellitus type 2, hypertension, obstructive sleep apnea, who was admitted to [facility] on 01/28/2018 for evaluation of cardiac arrest that occurred at home.  Patient was brought in pulseless and CPR was performed in the emergency department with return of spontaneous circulation.  He was then intubated and admitted to the intensive care unit.  Currently has an elevated troponin of 1.35.  Lactic acid is also elevated at 2.6.  Creatinine now up to 4.31 and urine output was only 63 cc over the preceding 24 hours.  He also has metabolic acidosis with a pH of 7.18.  He is comatose at present as well.   Medications: Outpatient medications: Medications Prior to Admission  Medication Sig Dispense Refill Last Dose  . ALPRAZolam (XANAX) 0.5 MG tablet Take 0.5 mg by mouth 2 (two) times daily as needed for anxiety.  0   . furosemide (LASIX) 20 MG tablet Take 1 tablet (20 mg total) by mouth daily. 30 tablet 0   . hydrALAZINE (APRESOLINE) 25 MG tablet Take 1 tablet (25 mg total) by mouth every 8 (eight) hours. 90 tablet 0   . metoprolol tartrate (LOPRESSOR) 50 MG tablet Take 1 tablet (50 mg total) by mouth 2 (two) times daily. 60 tablet 0   . moxifloxacin (AVELOX) 400 MG tablet Take 1 tablet (400 mg total) by mouth daily for 7 days. 4 tablet 0   . aspirin EC 81 MG tablet Take 81 mg by mouth daily.   01/19/2018 at 1400  . HYDROcodone-acetaminophen (NORCO/VICODIN) 5-325 MG tablet Take 1 tablet by mouth every 4 (four) hours as needed. (Patient not taking: Reported on 01/20/2018) 10 tablet 0 Not Taking at  Unknown time  . losartan (COZAAR) 25 MG tablet Take 25 mg by mouth daily.   01/19/2018 at 1400  . metFORMIN (GLUCOPHAGE-XR) 500 MG 24 hr tablet Take 2,000 mg by mouth daily.    01/19/2018 at 1400  . polyethylene glycol powder (GLYCOLAX/MIRALAX) powder Take 17 g by mouth daily as needed for moderate constipation. (Patient not taking: Reported on 01/20/2018) 255 g 0 Not Taking at Unknown time  . saxagliptin HCl (ONGLYZA) 5 MG TABS tablet Take 5 mg by mouth daily.    01/19/2018 at 1400    Current medications: Current Facility-Administered Medications  Medication Dose Route Frequency Provider Last Rate Last Dose  . 0.9 %  sodium chloride infusion  250 mL Intravenous PRN Lance Coon, MD      . 0.9 %  sodium chloride infusion   Intravenous Continuous Tukov-Yual, Magdalene S, NP 10 mL/hr at 01/30/18 0600    . 0.9 %  sodium chloride infusion   Intravenous Continuous Tukov-Yual, Magdalene S, NP      . acetaminophen (TYLENOL) tablet 650 mg  650 mg Oral  Q6H PRN Tukov-Yual, Arlyss Gandy, NP       Or  . acetaminophen (TYLENOL) suppository 650 mg  650 mg Rectal Q6H PRN Tukov-Yual, Magdalene S, NP   650 mg at 01/28/18 0530  . artificial tears (LACRILUBE) ophthalmic ointment 1 application  1 application Both Eyes Y6V Tukov-Yual, Magdalene S, NP   1 application at 78/58/85 0538  . ceFEPIme (MAXIPIME) 2 g in sodium chloride 0.9 % 100 mL IVPB  2 g Intravenous Q12H Tukov-Yual, Arlyss Gandy, NP   Stopped at 01/30/18 [phone]  . chlorhexidine gluconate (MEDLINE KIT) (PERIDEX) 0.12 % solution 15 mL  15 mL Mouth Rinse BID Tukov-Yual, Magdalene S, NP   15 mL at 01/30/18 0848  . cisatracurium (NIMBEX) bolus via infusion 10.4 mg  0.1 mg/kg Intravenous Once Tukov-Yual, Magdalene S, NP   Stopped at 01/28/18 0300   And  . cisatracurium (NIMBEX) 200 mg in sodium chloride 0.9 % 200 mL (1 mg/mL) infusion  1-1.5 mcg/kg/min Intravenous Continuous Tukov-Yual, Magdalene S, NP   Stopped at 01/28/18 0300   And  . cisatracurium (NIMBEX)  bolus via infusion 5.2 mg  0.05 mg/kg Intravenous PRN Tukov-Yual, Magdalene S, NP      . dextrose 5 %-0.45 % sodium chloride infusion   Intravenous Continuous Tukov-Yual, Arlyss Gandy, NP   Stopped at 01/28/18 0553  . dextrose 50 % solution 25 mL  25 mL Intravenous PRN Tukov-Yual, Magdalene S, NP      . enoxaparin (LOVENOX) injection 40 mg  40 mg Subcutaneous Q24H Lance Coon, MD   40 mg at 01/29/18 2000  . famotidine (PEPCID) IVPB 20 mg premix  20 mg Intravenous Laurence Spates, MD   Stopped at 01/29/18 2130  . fentaNYL (SUBLIMAZE) bolus via infusion 50 mcg  50 mcg Intravenous Q30 min PRN Tukov-Yual, Magdalene S, NP      . fentaNYL 2572mg in NS 2530m(1015mml) infusion-PREMIX  100-300 mcg/hr Intravenous Continuous Tukov-Yual, Magdalene S, NP 20 mL/hr at 01/30/18 0600 200 mcg/hr at 01/30/18 0600  . heparin injection 5,000 Units  5,000 Units Intracatheter PRN Tukov-Yual, Magdalene S, NP      . insulin aspart (novoLOG) injection 0-20 Units  0-20 Units Subcutaneous Q4H Tukov-Yual, Magdalene S, NP   7 Units at 01/30/18 0853  . insulin glargine (LANTUS) injection 5 Units  5 Units Subcutaneous QHS Tukov-Yual, Magdalene S, NP   5 Units at 01/29/18 0441  . ipratropium-albuterol (DUONEB) 0.5-2.5 (3) MG/3ML nebulizer solution 3 mL  3 mL Nebulization Q6H Tukov-Yual, Magdalene S, NP   3 mL at 01/30/18 0709  . MEDLINE mouth rinse  15 mL Mouth Rinse 10 times per day Tukov-Yual, Magdalene S, NP   15 mL at 01/30/18 0540  . midazolam (VERSED) 50 mg in sodium chloride 0.9 % 50 mL (1 mg/mL) infusion  0.5-10 mg/hr Intravenous Continuous Tukov-Yual, MagArlyss GandyP   Stopped at 01/28/18 0309  . norepinephrine (LEVOPHED) 16 mg in dextrose 5 % 250 mL (0.064 mg/mL) infusion  0-50 mcg/min Intravenous Titrated Tukov-Yual, Magdalene S, NP 14.1 mL/hr at 01/30/18 0935 15 mcg/min at 01/30/18 0935  . propofol (DIPRIVAN) 1000 MG/100ML infusion  25-80 mcg/kg/min Intravenous Continuous Tukov-Yual, MagArlyss GandyP   Stopped at  01/30/18 [phone] sodium bicarbonate 150 mEq in sterile water 1,000 mL infusion   Intravenous Continuous Tukov-Yual, Magdalene S, NP 150 mL/hr at 01/30/18 0934    . sodium chloride flush (NS) 0.9 % injection 10-40 mL  10-40 mL Intracatheter Q12H Tukov-Yual, MagArlyss GandyP  10 mL at 01/29/18 2100  . sodium chloride flush (NS) 0.9 % injection 10-40 mL  10-40 mL Intracatheter PRN Tukov-Yual, Magdalene S, NP      . vancomycin (VANCOCIN) 1,250 mg in sodium chloride 0.9 % 250 mL IVPB  1,250 mg Intravenous PRN Tukov-Yual, Magdalene S, NP      . vasopressin (PITRESSIN) 40 Units in sodium chloride 0.9 % 250 mL (0.16 Units/mL) infusion  0.03 Units/min Intravenous Continuous Conforti, John, DO 11.3 mL/hr at 01/30/18 0600 0.03 Units/min at 01/30/18 0600      Allergies: Allergies  Allergen Reactions  . Enalapril Maleate Anaphylaxis  . Iodinated Diagnostic Agents Itching  . Isosorbide Mononitrate Er [Isosorbide Dinitrate] Other (See Comments)    "made me not be able to think or function"  . Canagliflozin Other (See Comments)      Past Medical History: Past Medical History:  Diagnosis Date  . Chest pain 2019  . Diabetes mellitus without complication ([place])   . Hypertension   . Sleep apnea      Past Surgical History: Past Surgical History:  Procedure Laterality Date  . COLONOSCOPY  2017  . LIPOMA EXCISION     Left forehead     Family History: Family History  Problem Relation Age of Onset  . Diabetes Father   . Colon cancer Neg Hx      Social History: Social History   Socioeconomic History  . Marital status: Married    Spouse name: Not on file  . Number of children: Not on file  . Years of education: Not on file  . Highest education level: Not on file  Occupational History  . Not on file  Social Needs  . Financial resource strain: Not on file  . Food insecurity:    Worry: Not on file    Inability: Not on file  . Transportation needs:    Medical: Not on file     Non-medical: Not on file  Tobacco Use  . Smoking status: Current Every Day Smoker    Packs/day: 0.30    Years: 32.00    Pack years: 9.60    Types: Cigarettes  . Smokeless tobacco: Never Used  Substance and Sexual Activity  . Alcohol use: No  . Drug use: Never  . Sexual activity: Not Currently  Lifestyle  . Physical activity:    Days per week: Not on file    Minutes per session: Not on file  . Stress: Not on file  Relationships  . Social connections:    Talks on phone: Not on file    Gets together: Not on file    Attends religious service: Not on file    Active member of club or organization: Not on file    Attends meetings of clubs or organizations: Not on file    Relationship status: Not on file  . Intimate partner violence:    Fear of current or ex partner: Not on file    Emotionally abused: Not on file    Physically abused: Not on file    Forced sexual activity: Not on file  Other Topics Concern  . Not on file  Social History Narrative  . Not on file     Review of Systems: Patient cannot provide given comatose status  Vital Signs: Blood pressure 122/80, pulse (!) 103, temperature 98.4 F (36.9 C), resp. rate (!) 33, height _0  (1.854 m), weight 118.1 kg (260 lb 5.8 oz), SpO2 95 %.  Weight trends: [employer]  01/28/18 0400 01/29/18 0500 01/30/18 0406  Weight: 110.6 kg (243 lb 13.3 oz) 116.2 kg (256 lb 2.8 oz) 118.1 kg (260 lb 5.8 oz)    Physical Exam: General: Critically ill-appearing  Head: Normocephalic, atraumatic.  Eyes: closed  Nose: Mucous membranes moist, not inflammed, nonerythematous.  Throat: ETT in place  Neck: Supple, trachea midline.  Lungs:  Bilateral rhonchi, vent assisted  Heart: S1S2 no obvious rub  Abdomen:  BS normoactive. Soft, Nondistended, non-tender.  No masses or organomegaly.  Extremities: 1+ pretibial edema.  Neurologic: Comatose, on the ventilator  Skin: No visible rashes, scars.    Lab results: Basic Metabolic  Panel: Recent Labs  Lab 01/28/18 0432  01/28/18 1245  01/29/18 0210  01/30/18 0006 01/30/18 0355 01/30/18 0850  NA  --    < > 141   < > 139   < > 136 135 136  K  --    < > 4.4   < > 3.1*   < > 5.0 4.8 4.6  CL  --    < > 108   < > 109   < > 104 104 102  CO2  --    < > 23   < > 20*   < > 18* 17* 20*  GLUCOSE  --    < > 340*   < > 167*   < > 312* 259* 225*  BUN  --    < > 26*   < > 33*   < > 43* 45* 46*  CREATININE  --    < > 2.12*   < > 2.39*   < > 3.70* 4.05* 4.31*  CALCIUM  --    < > 8.4*   < > 7.8*   < > 7.2* 7.4* 7.3*  MG 2.2  --  2.1  --  2.0  --   --   --   --   PHOS 8.2*  --   --   --  5.5*  --   --   --   --    < > = values in this interval not displayed.    Liver Function Tests: Recent Labs  Lab 01/28/18 0034 01/28/18 0433  AST 58* 59*  ALT 31 36  ALKPHOS 49 54  BILITOT 0.5 1.0  PROT 5.8* 6.7  ALBUMIN 2.2* 2.6*   No results for input(s): LIPASE, AMYLASE in the last 168 hours. No results for input(s): AMMONIA in the last 168 hours.  CBC: Recent Labs  Lab 01/24/18 0910 01/28/18 0034 01/28/18 0432 01/29/18 0210  WBC 17.2* 22.5* 33.8* 25.9*  NEUTROABS 10.6*  --   --   --   HGB 12.1* 10.3* 11.9* 10.3*  HCT 36.0* 30.9* 34.5* 30.1*  MCV 93.4 96.2 93.5 92.2  PLT 233 288 268 113*    Cardiac Enzymes: Recent Labs  Lab 01/28/18 0433 01/28/18 0816 01/28/18 1440 01/28/18 2151 01/29/18 0210 01/30/18 0006  CKTOTAL  --   --   --   --   --  385  TROPONINI 0.50* 0.90* 1.17* 1.36* 1.35*  --     BNP: Invalid input(s): POCBNP  CBG: Recent Labs  Lab 01/29/18 1543 01/29/18 1948 01/30/18 0005 01/30/18 0355 01/30/18 0718  GLUCAP 208* 238* 238* 260* 220*    Microbiology: Results for orders placed or performed during the hospital encounter of 01/13/2018  MRSA PCR Screening     Status: None   Collection Time: 01/28/18  5:00 AM  Result Value Ref Range  Status   MRSA by PCR NEGATIVE NEGATIVE Final    Comment:        The GeneXpert MRSA Assay (FDA approved  for NASAL specimens only), is one component of a comprehensive MRSA colonization surveillance program. It is not intended to diagnose MRSA infection nor to guide or monitor treatment for MRSA infections. Performed at [facility], [address], [place], [place] [address]   Urine Culture     Status: None   Collection Time: 01/28/18  6:16 AM  Result Value Ref Range Status   Specimen Description   Final    URINE, RANDOM Performed at [facility], [address] [place], [place] [address]    Special Requests   Final    NONE Performed at [facility], [address] [place], [place] [address]    Culture   Final    NO GROWTH Performed at [facility], [place] [address], [place], [place] [address]    Report Status 01/29/2018 FINAL  Final  Culture, blood (Routine X 2) w Reflex to ID Panel     Status: None (Preliminary result)   Collection Time: 01/28/18  6:45 AM  Result Value Ref Range Status   Specimen Description BLOOD LAC  Final   Special Requests   Final    BOTTLES DRAWN AEROBIC AND ANAEROBIC Blood Culture adequate volume   Culture   Final    NO GROWTH 2 DAYS Performed at [facility], [address], [place], [place] [address]    Report Status PENDING  Incomplete  Culture, blood (Routine X 2) w Reflex to ID Panel     Status: None (Preliminary result)   Collection Time: 01/28/18  6:56 AM  Result Value Ref Range Status   Specimen Description BLOOD RAC  Final   Special Requests   Final    BOTTLES DRAWN AEROBIC AND ANAEROBIC Blood Culture results may not be optimal due to an inadequate volume of blood received in culture bottles   Culture   Final    NO GROWTH 2 DAYS Performed at [facility], [address] [place], [place] [address]    Report Status PENDING  Incomplete  Culture, respiratory (non-expectorated)     Status: None (Preliminary result)   Collection Time: 01/28/18 11:24 AM  Result Value Ref Range Status    Specimen Description   Final    TRACHEAL ASPIRATE Performed at [facility], [address], [place], [place] [address]    Special Requests   Final    NONE Performed at [facility], [address] [place], [place] [address]    Gram Stain   Final    RARE WBC PRESENT,BOTH PMN AND MONONUCLEAR RARE GRAM POSITIVE COCCI IN PAIRS    Culture   Final    RARE Consistent with normal respiratory flora. Performed at [facility], [place] [address] [place],  [address]    Report Status PENDING  Incomplete    Coagulation Studies: Recent Labs    01/28/18 0433 01/28/18 1145  LABPROT 16.0* 16.7*  INR 1.29 1.36    Urinalysis: No results for input(s): COLORURINE, LABSPEC, PHURINE, GLUCOSEU, HGBUR, BILIRUBINUR, KETONESUR, PROTEINUR, UROBILINOGEN, NITRITE, LEUKOCYTESUR in the last 72 hours.  Invalid input(s): APPERANCEUR    Imaging: Dg Chest Port 1 View  Result Date: 01/30/2018 CLINICAL DATA:  57 y.o. male.with a history of type 2 diabetes hypertension and sleep apnea who was brought to the ED by his spouse in cardiac  and respiratory arrest. now on vent, smoker EXAM: PORTABLE CHEST 1 VIEW COMPARISON:  Chest x-rays dated 01/28/2018 and 01/24/2018. FINDINGS: Study is hypoinspiratory. There is continued bilateral pulmonary edema pattern, not significantly changed compared to the most recent study of 01/28/2018, increased compared to the earlier study of 01/24/2018. Given the low lung volumes, heart size and mediastinal contours appear stable. Endotracheal tube tip is at the level of the clavicles, approximately 6 cm above the carina. LEFT jugular vein central line is stable in position with tip at the level of the upper SVC. No pneumothorax seen. Probable small bilateral pleural effusions. IMPRESSION: 1. Continued bilateral pulmonary edema pattern, not significantly changed compared to most recent chest x-ray of 01/28/2018. 2. Probable small bilateral pleural effusions. 3.  Cardiomegaly. 4. Support apparatus appears stable in position. Endotracheal tube tip is at the level of the clavicles, approximately 6 cm above the carina. Electronically Signed   By: Franki Cabot M.D.   On: 01/30/2018 09:37      Assessment & Plan: Pt is a 57 y.o. male with a PMHx of diabetes mellitus type 2, hypertension, obstructive sleep apnea, who was admitted to [facility] on 01/13/2018 for evaluation of cardiac arrest that occurred at home.   1.  Acute renal failure, oligo anuric. 2.  Metabolic acidosis pH 5.61. 3.  Acute respiratory failure. 4.  Cardiac arrest on hypothermia protocol. 5.  Anemia unspecified.  Plan: Patient has suffered a severe cardiac arrest and has now developed multiorgan failure.  In regards to his acute renal failure he only produced 63 cc of urine over the preceding 24 hours.  Critical care to meet with the family today to discuss overall goals of care.  If aggressive care is elected for at that meeting recommend initiating continuous renal replacement therapy.  This was discussed with Dr. Jefferson Fuel.  Temporary dialysis catheter has already been placed.  We would recommend a blood flow rate of 300, dialysate flow rate of 2.5 L/h, and no ultrafiltration at the moment.  We also recommend a 4K bath to be used.  Check serum electrolytes frequently while on CRRT.  Overall prognosis quite guarded.

## 2018-01-30 NOTE — Progress Notes (Signed)
Pharmacy Antibiotic Note  [name] is a 57 y.o. male admitted on 01/25/2018 with sepsis.  Pharmacy has been consulted for vancomycin and cefepime dosing.  Plan: DW 85 kg  Vd 60L kei 0.051 hr-1  T1/2 14 hours Vancomycin 1250 mg q 18 hours ordered with stacked dosing. Level before 5th dose. Goal trough 15-20.  Cefepime 2 grams q 12 hours ordered  Height: 6\' 1"  (185.4 cm) Weight: 256 lb 2.8 oz (116.2 kg) IBW/kg (Calculated) : 79.9  Temp (24hrs), Avg:94 F (34.4 C), Min:90.1 F (32.3 C), Max:98.8 F (37.1 C)  Recent Labs  Lab 01/23/18 0538 01/24/18 0910 01/28/18 0034 01/28/18 0430 01/28/18 0432  01/29/18 0210 01/29/18 0756 01/29/18 1152 01/29/18 1600 01/29/18 1944 01/30/18 0006  WBC 19.1* 17.2* 22.5*  --  33.8*  --  25.9*  --   --   --   --   --   CREATININE 0.99 0.96 1.47*  --   --    < > 2.39* 2.70* 2.76* 3.09* 3.45* 3.70*  LATICACIDVEN  --   --  8.7* 2.3*  --   --  3.5*  --   --   --   --   --   VANCORANDOM  --   --   --   --   --   --   --   --   --   --   --  32   < > = values in this interval not displayed.    Estimated Creatinine Clearance: 29.4 mL/min (A) (by C-G formula based on SCr of 3.7 mg/dL (H)).    Allergies  Allergen Reactions  . Enalapril Maleate Anaphylaxis  . Iodinated Diagnostic Agents Itching  . Isosorbide Mononitrate Er [Isosorbide Dinitrate] Other (See Comments)    "made me not be able to think or function"  . Canagliflozin Other (See Comments)    Antimicrobials this admission: Vancomycin, cefepime 7/27  >>    >>   Dose adjustments this admission: 7/29 0100 vanc level 32. Order changed to PRN for place holder. Recheck vanc level with 7/30 AM labs.   Microbiology results: 7/27 BCx: pending 7/27 MRSA PCR: pending      Thank you for allowing pharmacy to be a part of this patient's care.  Kalee Broxton S 01/30/2018 3:09 AM

## 2018-01-30 NOTE — Progress Notes (Signed)
eeg completed ° °

## 2018-01-31 ENCOUNTER — Inpatient Hospital Stay: Payer: BLUE CROSS/BLUE SHIELD

## 2018-01-31 LAB — RENAL FUNCTION PANEL
ALBUMIN: 2.1 g/dL — AB (ref 3.5–5.0)
ALBUMIN: 2.1 g/dL — AB (ref 3.5–5.0)
ALBUMIN: 2.2 g/dL — AB (ref 3.5–5.0)
ALBUMIN: 2.2 g/dL — AB (ref 3.5–5.0)
ANION GAP: 12 (ref 5–15)
ANION GAP: 13 (ref 5–15)
ANION GAP: 9 (ref 5–15)
Albumin: 1.9 g/dL — ABNORMAL LOW (ref 3.5–5.0)
Albumin: 2.1 g/dL — ABNORMAL LOW (ref 3.5–5.0)
Anion gap: 10 (ref 5–15)
Anion gap: 9 (ref 5–15)
Anion gap: 10 (ref 5–15)
BUN: 40 mg/dL — ABNORMAL HIGH (ref 6–20)
BUN: 40 mg/dL — ABNORMAL HIGH (ref 6–20)
BUN: 41 mg/dL — AB (ref 6–20)
BUN: 41 mg/dL — ABNORMAL HIGH (ref 6–20)
BUN: 42 mg/dL — AB (ref 6–20)
BUN: 46 mg/dL — ABNORMAL HIGH (ref 6–20)
CALCIUM: 7.3 mg/dL — AB (ref 8.9–10.3)
CALCIUM: 7.7 mg/dL — AB (ref 8.9–10.3)
CALCIUM: 7.9 mg/dL — AB (ref 8.9–10.3)
CALCIUM: 8 mg/dL — AB (ref 8.9–10.3)
CHLORIDE: 102 mmol/L (ref 98–111)
CO2: 22 mmol/L (ref 22–32)
CO2: 23 mmol/L (ref 22–32)
CO2: 24 mmol/L (ref 22–32)
CO2: 25 mmol/L (ref 22–32)
CO2: 26 mmol/L (ref 22–32)
CO2: 26 mmol/L (ref 22–32)
CREATININE: 3.83 mg/dL — AB (ref 0.61–1.24)
CREATININE: 3.5 mg/dL — AB (ref 0.61–1.24)
CREATININE: 3.24 mg/dL — AB (ref 0.61–1.24)
Calcium: 7.8 mg/dL — ABNORMAL LOW (ref 8.9–10.3)
Calcium: 7.8 mg/dL — ABNORMAL LOW (ref 8.9–10.3)
Chloride: 102 mmol/L (ref 98–111)
Chloride: 101 mmol/L (ref 98–111)
Chloride: 103 mmol/L (ref 98–111)
Chloride: 101 mmol/L (ref 98–111)
Chloride: 101 mmol/L (ref 98–111)
Creatinine, Ser: 4.26 mg/dL — ABNORMAL HIGH (ref 0.61–1.24)
Creatinine, Ser: 3.7 mg/dL — ABNORMAL HIGH (ref 0.61–1.24)
Creatinine, Ser: 3.26 mg/dL — ABNORMAL HIGH (ref 0.61–1.24)
GFR calc Af Amer: 19 mL/min — ABNORMAL LOW (ref >60)
GFR calc Af Amer: 21 mL/min — ABNORMAL LOW (ref >60)
GFR calc Af Amer: 23 mL/min — ABNORMAL LOW (ref >60)
GFR calc non Af Amer: 16 mL/min — ABNORMAL LOW (ref >60)
GFR calc non Af Amer: 17 mL/min — ABNORMAL LOW (ref >60)
GFR calc non Af Amer: 20 mL/min — ABNORMAL LOW (ref >60)
GFR calc non Af Amer: 20 mL/min — ABNORMAL LOW (ref >60)
GFR, EST AFRICAN AMERICAN: 16 mL/min — AB (ref >60)
GFR, EST AFRICAN AMERICAN: 19 mL/min — AB (ref >60)
GFR, EST AFRICAN AMERICAN: 23 mL/min — AB (ref >60)
GFR, EST NON AFRICAN AMERICAN: 14 mL/min — AB (ref >60)
GFR, EST NON AFRICAN AMERICAN: 18 mL/min — AB (ref >60)
GLUCOSE: 138 mg/dL — AB (ref 70–99)
Glucose, Bld: 135 mg/dL — ABNORMAL HIGH (ref 70–99)
Glucose, Bld: 119 mg/dL — ABNORMAL HIGH (ref 70–99)
Glucose, Bld: 125 mg/dL — ABNORMAL HIGH (ref 70–99)
Glucose, Bld: 127 mg/dL — ABNORMAL HIGH (ref 70–99)
Glucose, Bld: 129 mg/dL — ABNORMAL HIGH (ref 70–99)
PHOSPHORUS: 5.3 mg/dL — AB (ref 2.5–4.6)
PHOSPHORUS: 5.8 mg/dL — AB (ref 2.5–4.6)
PHOSPHORUS: 6.5 mg/dL — AB (ref 2.5–4.6)
POTASSIUM: 4.3 mmol/L (ref 3.5–5.1)
Phosphorus: 6.3 mg/dL — ABNORMAL HIGH (ref 2.5–4.6)
Phosphorus: 5.7 mg/dL — ABNORMAL HIGH (ref 2.5–4.6)
Phosphorus: 5.8 mg/dL — ABNORMAL HIGH (ref 2.5–4.6)
Potassium: 4.4 mmol/L (ref 3.5–5.1)
Potassium: 4.5 mmol/L (ref 3.5–5.1)
Potassium: 4.6 mmol/L (ref 3.5–5.1)
Potassium: 4.7 mmol/L (ref 3.5–5.1)
Potassium: 4.8 mmol/L (ref 3.5–5.1)
SODIUM: 136 mmol/L (ref 135–145)
SODIUM: 136 mmol/L (ref 135–145)
SODIUM: 137 mmol/L (ref 135–145)
SODIUM: 137 mmol/L (ref 135–145)
SODIUM: 138 mmol/L (ref 135–145)
Sodium: 135 mmol/L (ref 135–145)

## 2018-01-31 LAB — BLOOD GAS, ARTERIAL
ACID-BASE DEFICIT: 1.7 mmol/L (ref 0.0–2.0)
Bicarbonate: 23.7 mmol/L (ref 20.0–28.0)
FIO2: 0.6
MECHVT: 500 mL
O2 Saturation: 98.8 %
PCO2 ART: 42 mmHg (ref 32.0–48.0)
PEEP: 10 cmH2O
PH ART: 7.36 (ref 7.350–7.450)
Patient temperature: 37
RATE: 25 resp/min
pO2, Arterial: 129 mmHg — ABNORMAL HIGH (ref 83.0–108.0)

## 2018-01-31 LAB — MAGNESIUM
MAGNESIUM: 2 mg/dL (ref 1.7–2.4)
Magnesium: 1.9 mg/dL (ref 1.7–2.4)
Magnesium: 2 mg/dL (ref 1.7–2.4)
Magnesium: 2.1 mg/dL (ref 1.7–2.4)
Magnesium: 2.2 mg/dL (ref 1.7–2.4)
Magnesium: 2.4 mg/dL (ref 1.7–2.4)

## 2018-01-31 LAB — APTT: APTT: 39 s — AB (ref 24–36)

## 2018-01-31 LAB — GLUCOSE, CAPILLARY
GLUCOSE-CAPILLARY: 113 mg/dL — AB (ref 70–99)
GLUCOSE-CAPILLARY: 117 mg/dL — AB (ref 70–99)
Glucose-Capillary: 104 mg/dL — ABNORMAL HIGH (ref 70–99)
Glucose-Capillary: 114 mg/dL — ABNORMAL HIGH (ref 70–99)

## 2018-01-31 MED ORDER — VECURONIUM BROMIDE 10 MG IV SOLR
8.0000 mg | Freq: Once | INTRAVENOUS | Status: AC
Start: 2018-01-31 — End: 2018-01-31
  Administered 2018-01-31: 8 mg via INTRAVENOUS
  Filled 2018-01-31: qty 10

## 2018-01-31 MED ORDER — LORAZEPAM 2 MG/ML IJ SOLN
INTRAMUSCULAR | Status: AC
  Administered 2018-01-31: 2 mg via INTRAVENOUS
  Filled 2018-01-31: qty 1

## 2018-01-31 MED ORDER — METOPROLOL TARTRATE 5 MG/5ML IV SOLN
5.0000 mg | Freq: Once | INTRAVENOUS | Status: AC
  Administered 2018-01-31: 5 mg via INTRAVENOUS
  Filled 2018-01-31: qty 5

## 2018-01-31 MED ORDER — LORAZEPAM 2 MG/ML IJ SOLN
2.0000 mg | Freq: Once | INTRAMUSCULAR | Status: AC
  Administered 2018-01-31: 2 mg via INTRAVENOUS

## 2018-01-31 MED ORDER — PROPOFOL 1000 MG/100ML IV EMUL
INTRAVENOUS | Status: AC
Start: 2018-01-31 — End: 2018-02-01
  Administered 2018-02-01: 20 ug/kg/min via INTRAVENOUS
  Filled 2018-01-31: qty 100

## 2018-01-31 MED ORDER — HYDRALAZINE HCL 20 MG/ML IJ SOLN
10.0000 mg | INTRAMUSCULAR | Status: DC | PRN
  Administered 2018-01-31 – 2018-02-09 (×4): 20 mg via INTRAVENOUS
  Filled 2018-01-31 (×4): qty 1

## 2018-01-31 MED ORDER — METOPROLOL TARTRATE 5 MG/5ML IV SOLN
5.0000 mg | Freq: Four times a day (QID) | INTRAVENOUS | Status: DC
  Administered 2018-01-31 – 2018-02-09 (×36): 5 mg via INTRAVENOUS
  Filled 2018-01-31 (×36): qty 5

## 2018-01-31 MED ORDER — PROPOFOL 1000 MG/100ML IV EMUL
5.0000 ug/kg/min | INTRAVENOUS | Status: DC
  Administered 2018-01-31 – 2018-02-02 (×7): 20 ug/kg/min via INTRAVENOUS
  Administered 2018-02-03 (×3): 40 ug/kg/min via INTRAVENOUS
  Administered 2018-02-03 (×2): 30 ug/kg/min via INTRAVENOUS
  Administered 2018-02-04: 40 ug/kg/min via INTRAVENOUS
  Administered 2018-02-04: 30 ug/kg/min via INTRAVENOUS
  Administered 2018-02-04 (×2): 40 ug/kg/min via INTRAVENOUS
  Administered 2018-02-04: 50 ug/kg/min via INTRAVENOUS
  Administered 2018-02-04: 30 ug/kg/min via INTRAVENOUS
  Administered 2018-02-05 (×2): 60 ug/kg/min via INTRAVENOUS
  Administered 2018-02-05: 50 ug/kg/min via INTRAVENOUS
  Administered 2018-02-05 – 2018-02-06 (×7): 60 ug/kg/min via INTRAVENOUS
  Administered 2018-02-06: 40 ug/kg/min via INTRAVENOUS
  Administered 2018-02-06: 60 ug/kg/min via INTRAVENOUS
  Administered 2018-02-06: 70 ug/kg/min via INTRAVENOUS
  Administered 2018-02-06: 40 ug/kg/min via INTRAVENOUS
  Administered 2018-02-06: 60 ug/kg/min via INTRAVENOUS
  Administered 2018-02-06: 30 ug/kg/min via INTRAVENOUS
  Administered 2018-02-07 (×3): 60 ug/kg/min via INTRAVENOUS
  Administered 2018-02-07: 30 ug/kg/min via INTRAVENOUS
  Administered 2018-02-07: 60 ug/kg/min via INTRAVENOUS
  Filled 2018-01-31 (×42): qty 100

## 2018-01-31 NOTE — Progress Notes (Signed)
[place] [place] NAME: [name]    MR#:  [id number]  DATE OF BIRTH:  02/22/1961  SUBJECTIVE:   Patient is off sedation, has been warmed from hypothermic protocol but shown no signs of neurological recovery.  Patient CT scan of the head yesterday showed diffuse edema.  EEG was possibly consistent with anoxic brain injury.  Remains oliguric with worsening renal failure and therefore started on CRRT today.  REVIEW OF SYSTEMS:    Review of Systems  Unable to perform ROS: Intubated    Nutrition: NPO Tolerating Diet: No  DRUG ALLERGIES:   Allergies  Allergen Reactions  . Enalapril Maleate Anaphylaxis  . Iodinated Diagnostic Agents Itching  . Isosorbide Mononitrate Er [Isosorbide Dinitrate] Other (See Comments)    "made me not be able to think or function"  . Canagliflozin Other (See Comments)    VITALS:  Blood pressure (!) 148/100, pulse (!) 105, temperature 98.2 F (36.8 C), temperature source Bladder, resp. rate (!) 33, height 6\' 1"  (1.854 m), weight 118.9 kg (262 lb 2 oz), SpO2 99 %.  PHYSICAL EXAMINATION:   Physical Exam  GENERAL:  57 y.o.-year-old patient lying in bed intubated and having some twitching with some myoclonic jerks EYES: PERRL but minimally responsive.  Patient's right eye is adducted. HEENT: Head atraumatic, normocephalic. ET and OG tubes in place.   NECK:  Supple, no jugular venous distention. No thyroid enlargement, no tenderness.  LUNGS: Normal breath sounds bilaterally, no wheezing, rales, rhonchi. No use of accessory muscles of respiration.  CARDIOVASCULAR: S1, S2 normal. No murmurs, rubs, or gallops.  ABDOMEN: Soft, nontender, nondistended. Bowel sounds present. No organomegaly or mass.  EXTREMITIES: No cyanosis, clubbing or edema b/l.    NEUROLOGIC: Sedated & Intubated and having some myoclonic jerks and twitching. PSYCHIATRIC: Sedated & Intubated.   SKIN: No obvious rash, lesion, or ulcer.     LABORATORY PANEL:   CBC Recent Labs  Lab 01/29/18 0210  WBC 25.9*  HGB 10.3*  HCT 30.1*  PLT 113*   ------------------------------------------------------------------------------------------------------------------  Chemistries  Recent Labs  Lab 01/28/18 0433  01/31/18 1312  NA 141   < > 137  K 4.7   < > 4.6  CL 105   < > 103  CO2 27   < > 25  GLUCOSE 333*   < > 127*  BUN 23*   < > 40*  CREATININE 1.77*   < > 3.26*  CALCIUM 9.1   < > 7.9*  MG  --    < > 1.9  AST 59*  --   --   ALT 36  --   --   ALKPHOS 54  --   --   BILITOT 1.0  --   --    < > = values in this interval not displayed.   ------------------------------------------------------------------------------------------------------------------  Cardiac Enzymes Recent Labs  Lab 01/29/18 0210  TROPONINI 1.35*   ------------------------------------------------------------------------------------------------------------------  RADIOLOGY:  Ct Head Wo Contrast  Result Date: 01/30/2018 CLINICAL DATA:  Anoxic brain injury EXAM: CT HEAD WITHOUT CONTRAST TECHNIQUE: Contiguous axial images were obtained from the base of the skull through the vertex without intravenous contrast. COMPARISON:  Head CT 01/28/2018 FINDINGS: Brain: There is diffuse blurring of the gray-white interface, slightly greater than on the prior examination. There is crowding of the basal cisterns. No acute hemorrhage or other extra-axial collection. Vascular: No abnormal hyperdensity of the major intracranial arteries or dural venous sinuses. No intracranial atherosclerosis. Skull: The  visualized skull base, calvarium and extracranial soft tissues are normal. Sinuses/Orbits: No fluid levels or advanced mucosal thickening of the visualized paranasal sinuses. No mastoid or middle ear effusion. The orbits are normal. IMPRESSION: Diffuse blurring of the gray-white interface, compatible with global edema, slightly progressed compared to 01/28/2018. Mild  worsening of basal cisternal crowding. Electronically Signed   By: Ulyses Jarred M.D.   On: 01/30/2018 13:17   Dg Chest Port 1 View  Result Date: 01/31/2018 CLINICAL DATA:  Check endotracheal tube EXAM: PORTABLE CHEST 1 VIEW COMPARISON:  01/30/2018 FINDINGS: Endotracheal tube is noted at the thoracic inlet. Bilateral jugular catheters are again noted and stable. Cardiac shadow is stable but enlarged. Persistent right-sided infiltrate and effusion is seen. The left lung is clear. IMPRESSION: Right-sided effusion and infiltrate stable from the prior exam. Tubes and lines as described. Electronically Signed   By: Inez Catalina M.D.   On: 01/31/2018 10:49   Dg Chest Port 1 View  Result Date: 01/30/2018 CLINICAL DATA:  Central line placement EXAM: PORTABLE CHEST 1 VIEW COMPARISON:  01/30/2018 at 0918 hours FINDINGS: Endotracheal tube terminates 6 cm above the carina. Right IJ dual lumen catheter terminates in the lower SVC. Left IJ venous catheter terminates in the left brachiocephalic vein. Cardiomegaly with mild to moderate interstitial edema. Multifocal pneumonia is also possible. Suspected bilateral pleural effusions, right greater than left. No pneumothorax. IMPRESSION: Endotracheal tube terminates 6 cm above the carina. Right IJ catheter terminates in the lower SVC. Left IJ catheter terminates in the left brachiocephalic vein. Cardiomegaly with suspected mild to moderate interstitial edema and bilateral pleural effusions. Multifocal pneumonia is also possible. Electronically Signed   By: Julian Hy M.D.   On: 01/30/2018 21:42   Dg Chest Port 1 View  Result Date: 01/30/2018 CLINICAL DATA:  57 y.o. male.with a history of type 2 diabetes hypertension and sleep apnea who was brought to the ED by his spouse in cardiac and respiratory arrest. now on vent, smoker EXAM: PORTABLE CHEST 1 VIEW COMPARISON:  Chest x-rays dated 01/28/2018 and 01/24/2018. FINDINGS: Study is hypoinspiratory. There is continued  bilateral pulmonary edema pattern, not significantly changed compared to the most recent study of 01/28/2018, increased compared to the earlier study of 01/24/2018. Given the low lung volumes, heart size and mediastinal contours appear stable. Endotracheal tube tip is at the level of the clavicles, approximately 6 cm above the carina. LEFT jugular vein central line is stable in position with tip at the level of the upper SVC. No pneumothorax seen. Probable small bilateral pleural effusions. IMPRESSION: 1. Continued bilateral pulmonary edema pattern, not significantly changed compared to most recent chest x-ray of 01/28/2018. 2. Probable small bilateral pleural effusions. 3. Cardiomegaly. 4. Support apparatus appears stable in position. Endotracheal tube tip is at the level of the clavicles, approximately 6 cm above the carina. Electronically Signed   By: Franki Cabot M.D.   On: 01/30/2018 09:37     ASSESSMENT AND PLAN:   57 year old male with past medical history of ischemic cardiomyopathy ejection fraction of 20 to 76%, chronic systolic CHF, Obstructive sleep apnea, hypertension, diabetes who presented to the hospital as he was found unresponsive and noted to be in pulseless cardiac arrest.  1.  Pulseless cardiac arrest-as a result of worsening respiratory failure and flash pulmonary edema. - Patient was resuscitated in the ER for about 17 minutes prior to regaining spontaneous circulation. - No further arrhythmias, patient has been warmed back up and not showing any signs of meaningful  neurological recovery presently. Family is still hopeful and wants to cont. Aggressive care.   2.  Acute kidney injury with oliguria-secondary to underlying cardiac arrest and cardiorenal hemodynamics. -Not improved with fluids and Levophed.  Patient remains oliguric and seen by nephrology and therefore started on CRRT today.  3.  Altered mental status/encephalopathy-suspected to be secondary to underlying anoxic  brain injury. - Neurology was consulted, they have explained to the patient's wife about patient's poor prognosis and unlikely that he would recover from this.   -Patient continues to have myoclonic jerking movements/twitching consistent with anoxic brain injury.  Patient has no brainstem reflexes and as per neurology prognosis is very poor and grim chance of recovery.  Patient's family is still hopeful.   --repeat CT head showing global edema, EEG showing generalized slowing possibly consistent with underlying anoxic brain injury.   Prognosis is quite poor.    4.  Sepsis/septic shock-suspected but source remains unclear.  Continue empiric vancomycin, cefepime. - pt. Off vasopressors now.  Cultures remain (-).     5.  History of ischemic cardiomyopathy with ejection fraction of 20 to 25%- patient does not appear to be volume overloaded presently.   6.  Leukocytosis-secondary to suspected sepsis.  Follow with IV antibiotic therapy.  Patient has a very poor prognosis given his multiple comorbidities and now with multiorgan failure and likely anoxic brain injury.  Patient's wife still wants to continue aggressive care.   All the records are reviewed and case discussed with Care Management/Social Worker. Management plans discussed with the patient, family and they are in agreement.  CODE STATUS: Full code  DVT Prophylaxis: Hep SQ  TOTAL TIME TAKING CARE OF THIS PATIENT: 30 minutes.   POSSIBLE D/C IN unclear, DEPENDING ON CLINICAL CONDITION and course   Jamaal Bernasconi J M.D on 01/31/2018 at 3:12 PM  Between 7am to 6pm - Pager - [phone]  After 6pm go to [email or web address] - password [email or web address]  Office  [phone]  CC: Primary care physician; Marguerita Merles, MD

## 2018-01-31 NOTE — Progress Notes (Signed)
Ativan was not helpful in sedating patient for ETT reposition.  Order obtained for vecuronium 8mg  IV.

## 2018-01-31 NOTE — Progress Notes (Signed)
Patient with 30 beat run of v-tach and is hypertensive.  Dr. Jefferson Fuel notified and one time order for metoprolol 5mg  IV ordered.

## 2018-01-31 NOTE — Progress Notes (Signed)
Follow up - Critical Care Medicine Note  Patient Details:    [name] is an 57 y.o. male.with a history of type 2 diabetes hypertension and sleep apnea who was brought to the ED by his spouse in cardiac and respiratory arrest.    Lines, Airways, Drains: Airway 7 mm (Active)  Secured at (cm) 24 cm 01/29/2018  8:04 AM  Measured From Lips 01/29/2018  8:04 AM  [place] 01/29/2018  8:04 AM  Secured By [employer] 01/29/2018  8:04 AM  Tube Holder Repositioned Yes 01/29/2018  3:38 AM  Cuff Pressure (cm H2O) 25 cm H2O 01/29/2018  8:04 AM  Site Condition Dry 01/29/2018  8:04 AM     CVC Triple Lumen 01/28/18 Left Internal jugular (Active)  Indication for Insertion or Continuance of Line Vasoactive infusions 01/29/2018  8:00 AM  Site Assessment Clean;Dry;Intact 01/29/2018  8:00 AM  Proximal Lumen Status Infusing;Flushed;Blood return noted 01/29/2018  8:00 AM  Medial Lumen Status Infusing;Flushed;Blood return noted 01/29/2018  8:00 AM  Distal Lumen Status Infusing;Flushed;Blood return noted 01/29/2018  8:00 AM  Dressing Type Transparent;Occlusive 01/29/2018  8:00 AM  Dressing Status Clean;Dry;Intact;Antimicrobial disc in place 01/29/2018  8:00 AM  Line Care Connections checked and tightened;Zeroed and calibrated;Leveled 01/29/2018  8:00 AM  Dressing Intervention New dressing 01/28/2018  4:00 AM  Dressing Change Due 02/04/18 01/29/2018  8:00 AM     Arterial Line 01/28/18 Right Radial (Active)  Site Assessment Clean;Dry;Intact 01/29/2018  8:00 AM  Line Status Pulsatile blood flow;Positional 01/29/2018  8:00 AM  Art Line Waveform Appropriate 01/29/2018  8:00 AM  Art Line Interventions Zeroed and calibrated 01/29/2018  8:00 AM  Color/Movement/Sensation Capillary refill less than 3 sec 01/29/2018  8:00 AM  Dressing Type Transparent;Occlusive 01/29/2018  8:00 AM  Dressing Status Clean;Intact;Dry;Antimicrobial disc in place 01/29/2018  8:00 AM  Interventions New dressing;Antimicrobial disc  changed 01/28/2018  4:00 AM  Dressing Change Due 02/04/18 01/29/2018  8:00 AM     Urethral Catheter THAHN, ed tech Straight-tip;Temperature probe 16 Fr. (Active)  Indication for Insertion or Continuance of Catheter Unstable critical patients (first 24-48 hours) 01/29/2018  8:00 AM  Site Assessment Clean;Intact;Dry 01/29/2018  8:00 AM  Catheter Maintenance Bag below level of bladder;Catheter secured;Drainage bag/tubing not touching floor 01/29/2018  8:00 AM  Collection Container Standard drainage bag 01/29/2018  8:00 AM  Securement Method Other (Comment) 01/29/2018  8:00 AM  Output (mL) 0 mL 01/29/2018  8:00 AM    Anti-infectives:  Anti-infectives (From admission, onward)   Start     Dose/Rate Route Frequency Ordered Stop   01/30/18 2200  vancomycin (VANCOCIN) IVPB 1000 mg/200 mL premix     1,000 mg 200 mL/hr over 60 Minutes Intravenous Every 24 hours 01/30/18 2043     01/30/18 0500  vancomycin (VANCOCIN) 1,250 mg in sodium chloride 0.9 % 250 mL IVPB  Status:  Discontinued     1,250 mg 166.7 mL/hr over 90 Minutes Intravenous Every 18 hours 01/29/18 0956 01/30/18 0308   01/30/18 0307  vancomycin (VANCOCIN) 1,250 mg in sodium chloride 0.9 % 250 mL IVPB     1,250 mg 166.7 mL/hr over 90 Minutes Intravenous As needed 01/30/18 0308     01/28/18 1400  vancomycin (VANCOCIN) 1,250 mg in sodium chloride 0.9 % 250 mL IVPB  Status:  Discontinued     1,250 mg 166.7 mL/hr over 90 Minutes Intravenous Every 18 hours 01/28/18 0544 01/29/18 0956   01/28/18 0600  ceFEPIme (MAXIPIME) 2 g in sodium chloride  0.9 % 100 mL IVPB     2 g 200 mL/hr over 30 Minutes Intravenous Every 12 hours 01/28/18 0544     01/28/18 0545  vancomycin (VANCOCIN) 1,250 mg in sodium chloride 0.9 % 250 mL IVPB     1,250 mg 166.7 mL/hr over 90 Minutes Intravenous  Once 01/28/18 0544 01/28/18 1020      Microbiology: Results for orders placed or performed during the hospital encounter of 01/26/2018  MRSA PCR Screening     Status: None    Collection Time: 01/28/18  5:00 AM  Result Value Ref Range Status   MRSA by PCR NEGATIVE NEGATIVE Final    Comment:        The GeneXpert MRSA Assay (FDA approved for NASAL specimens only), is one component of a comprehensive MRSA colonization surveillance program. It is not intended to diagnose MRSA infection nor to guide or monitor treatment for MRSA infections. Performed at [facility], [address] [place], [place] [address]   Urine Culture     Status: None   Collection Time: 01/28/18  6:16 AM  Result Value Ref Range Status   Specimen Description   Final    URINE, RANDOM Performed at [facility], [address] [place], [place] [address]    Special Requests   Final    NONE Performed at [facility], [address] [place], [place] [address]    Culture   Final    NO GROWTH Performed at [facility], [place] [address], [place], [place] [address]    Report Status 01/29/2018 FINAL  Final  Culture, blood (Routine X 2) w Reflex to ID Panel     Status: None (Preliminary result)   Collection Time: 01/28/18  6:45 AM  Result Value Ref Range Status   Specimen Description BLOOD LAC  Final   Special Requests   Final    BOTTLES DRAWN AEROBIC AND ANAEROBIC Blood Culture adequate volume   Culture   Final    NO GROWTH 3 DAYS Performed at [facility], [address] [place] [address] [place], [place] [address]    Report Status PENDING  Incomplete  Culture, blood (Routine X 2) w Reflex to ID Panel     Status: None (Preliminary result)   Collection Time: 01/28/18  6:56 AM  Result Value Ref Range Status   Specimen Description BLOOD RAC  Final   Special Requests   Final    BOTTLES DRAWN AEROBIC AND ANAEROBIC Blood Culture results may not be optimal due to an inadequate volume of blood received in culture bottles   Culture   Final    NO GROWTH 3 DAYS Performed at [facility], [address], [place], [place] [address]    Report Status  PENDING  Incomplete  Culture, respiratory (non-expectorated)     Status: None   Collection Time: 01/28/18 11:24 AM  Result Value Ref Range Status   Specimen Description   Final    TRACHEAL ASPIRATE Performed at [facility], [address], [place], [place] [address]    Special Requests   Final    NONE Performed at [facility], [address] [place], [place] [address]    Gram Stain   Final    RARE WBC PRESENT,BOTH PMN AND MONONUCLEAR RARE GRAM POSITIVE COCCI IN PAIRS    Culture   Final    RARE Consistent with normal respiratory flora. Performed at [facility], [place] [address], [place],  [address]  Report Status 01/30/2018 FINAL  Final   Events:   Studies: Dg Abd 1 View  Result Date: 01/28/2018 CLINICAL DATA:  NG tube placement EXAM: ABDOMEN - 1 VIEW COMPARISON:  01/20/2018 FINDINGS: No enteric tube is demonstrated within the field of view. Mild gaseous distention of the stomach. IMPRESSION: No enteric tube visualized. Electronically Signed   By: Lucienne Capers M.D.   On: 01/28/2018 05:53   Dg Abd 1 View  Result Date: 01/20/2018 CLINICAL DATA:  Encounter for NG tube placement EXAM: ABDOMEN - 1 VIEW COMPARISON:  01/20/2018 FINDINGS: Malpositioned nasogastric tube, coiled backwards overlying the mid esophagus with tip excluded from view Endotracheal tube overlies the tracheal air column with tip superior to the carina. No pneumothorax. Pacer leads overlie the left ventricular apex. Pulmonary vasculature remains indistinct with cephalization of flow. Suspected trace right-sided pleural effusion. No definite left-sided pleural effusion, though the left costophrenic angle is excluded from view. Nonobstructive bowel gas pattern. No pneumoperitoneum, pneumatosis or portal venous gas. IMPRESSION: 1. Malpositioned nasogastric tube.  Repositioning is advised. 2. Similar findings of pulmonary edema and trace right-sided pleural effusion. 3. Nonobstructive  bowel gas pattern. Electronically Signed   By: Sandi Mariscal M.D.   On: 01/20/2018 14:03   Dg Abd 1 View  Result Date: 01/20/2018 CLINICAL DATA:  NG tube placement EXAM: ABDOMEN - 1 VIEW COMPARISON:  None. FINDINGS: There is no nasogastric tube identified in the lower chest or abdomen. There is mild gaseous distension of the colon and stomach. There is no evidence of pneumoperitoneum, portal venous gas or pneumatosis. There are no pathologic calcifications along the expected course of the ureters. The osseous structures are unremarkable. IMPRESSION: No nasogastric tube identified in the lower chest or abdomen. Electronically Signed   By: Kathreen Devoid   On: 01/20/2018 12:42   Dg Abd 1 View  Result Date: 01/20/2018 CLINICAL DATA:  Abdominal pain. EXAM: ABDOMEN - 1 VIEW COMPARISON:  None. FINDINGS: A right-sided pleural effusion is suspected layering posteriorly. Pacer paddles are noted on the chest. The bowel gas pattern is unremarkable. Air and stool scattered throughout the colon. A few scattered air-filled small bowel loops. No obvious free air. A right femoral line is noted. Bony structures are unremarkable. IMPRESSION: Suspect right pleural effusion. No plain film findings for an acute abdominal process. Electronically Signed   By: Marijo Sanes M.D.   On: 01/20/2018 11:22   Ct Head Wo Contrast  Result Date: 01/30/2018 CLINICAL DATA:  Anoxic brain injury EXAM: CT HEAD WITHOUT CONTRAST TECHNIQUE: Contiguous axial images were obtained from the base of the skull through the vertex without intravenous contrast. COMPARISON:  Head CT 01/28/2018 FINDINGS: Brain: There is diffuse blurring of the gray-white interface, slightly greater than on the prior examination. There is crowding of the basal cisterns. No acute hemorrhage or other extra-axial collection. Vascular: No abnormal hyperdensity of the major intracranial arteries or dural venous sinuses. No intracranial atherosclerosis. Skull: The visualized skull  base, calvarium and extracranial soft tissues are normal. Sinuses/Orbits: No fluid levels or advanced mucosal thickening of the visualized paranasal sinuses. No mastoid or middle ear effusion. The orbits are normal. IMPRESSION: Diffuse blurring of the gray-white interface, compatible with global edema, slightly progressed compared to 01/28/2018. Mild worsening of basal cisternal crowding. Electronically Signed   By: Ulyses Jarred M.D.   On: 01/30/2018 13:17   Ct Head Wo Contrast  Result Date: 01/28/2018 CLINICAL DATA:  Cardiac arrest. History of hypertension and diabetes. EXAM: CT HEAD WITHOUT CONTRAST TECHNIQUE:  Contiguous axial images were obtained from the base of the skull through the vertex without intravenous contrast. COMPARISON:  CT neck January 20, 2018. FINDINGS: BRAIN: No intraparenchymal hemorrhage, mass effect nor midline shift. Mild blurring of the gray-white matter differentiation. Patchy supratentorial white matter hypodensities. The ventricles and sulci are normal. No acute large vascular territory infarcts. No abnormal extra-axial fluid collections. Basal cisterns are patent. VASCULAR: Mild calcific atherosclerosis carotid bifurcations. SKULL/SOFT TISSUES: No skull fracture. No significant soft tissue swelling. ORBITS/SINUSES: The included ocular globes and orbital contents are normal.Mild paranasal sinus mucosal thickening without air-fluid levels. Included mastoid air cells are well aerated. OTHER: Life-support lines in place. IMPRESSION: 1. 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 2. Mild chronic small vessel ischemic changes. 3. Mild atherosclerosis. 4. Acute findings discussed with and reconfirmed by Dr.ALLISON WEBSTER on 01/28/2018 at 1:00 am. Electronically Signed   By: Elon Alas M.D.   On: 01/28/2018 01:04   Ct Soft Tissue Neck Wo Contrast  Result Date: 01/20/2018 CLINICAL DATA:  57 y/o  M; thyroid nodule. EXAM: CT NECK WITHOUT CONTRAST TECHNIQUE:  Multidetector CT imaging of the neck was performed following the standard protocol without intravenous contrast. COMPARISON:  None. FINDINGS: Pharynx and larynx: Debris within the airways from intubation. Salivary glands: No inflammation, mass, or stone. Thyroid: 5.6 cm nodule within the left lobe of the thyroid. 18 mm nodule in the right lobe of thyroid. Lymph nodes: None enlarged or abnormal density. Vascular: Negative. Limited intracranial: Negative. Visualized orbits: Negative. Mastoids and visualized paranasal sinuses: Mild mucosal thickening of the left maxillary sinus. Normal aeration of the mastoid air cells. Skeleton: No acute or aggressive process. Dental disease with multiple periapical cysts and absent crowns. Mild cervical spondylosis greatest at the C5-6 level. Upper chest: Endotracheal tube tip 3.4 cm above the carina. Moderate bilateral pleural effusions and dependent atelectasis of the lungs. Other: None. IMPRESSION: 1. Thyroid nodules measuring up to 5.6 cm in the left lobe of thyroid. Mass effect from the large left lobe of thyroid nodule displaces the airway rightward. 2. Endotracheal tube tip 3.4 cm above the carina. 3. Moderate bilateral pleural effusion with dependent atelectasis of the lungs. Electronically Signed   By: Kristine Garbe M.D.   On: 01/20/2018 04:44   Dg Chest Port 1 View  Result Date: 01/30/2018 CLINICAL DATA:  Central line placement EXAM: PORTABLE CHEST 1 VIEW COMPARISON:  01/30/2018 at 0918 hours FINDINGS: Endotracheal tube terminates 6 cm above the carina. Right IJ dual lumen catheter terminates in the lower SVC. Left IJ venous catheter terminates in the left brachiocephalic vein. Cardiomegaly with mild to moderate interstitial edema. Multifocal pneumonia is also possible. Suspected bilateral pleural effusions, right greater than left. No pneumothorax. IMPRESSION: Endotracheal tube terminates 6 cm above the carina. Right IJ catheter terminates in the lower SVC.  Left IJ catheter terminates in the left brachiocephalic vein. Cardiomegaly with suspected mild to moderate interstitial edema and bilateral pleural effusions. Multifocal pneumonia is also possible. Electronically Signed   By: Julian Hy M.D.   On: 01/30/2018 21:42   Dg Chest Port 1 View  Result Date: 01/30/2018 CLINICAL DATA:  57 y.o. male.with a history of type 2 diabetes hypertension and sleep apnea who was brought to the ED by his spouse in cardiac and respiratory arrest. now on vent, smoker EXAM: PORTABLE CHEST 1 VIEW COMPARISON:  Chest x-rays dated 01/28/2018 and 01/24/2018. FINDINGS: Study is hypoinspiratory. There is continued bilateral pulmonary edema pattern, not significantly changed compared to the most recent  study of 01/28/2018, increased compared to the earlier study of 01/24/2018. Given the low lung volumes, heart size and mediastinal contours appear stable. Endotracheal tube tip is at the level of the clavicles, approximately 6 cm above the carina. LEFT jugular vein central line is stable in position with tip at the level of the upper SVC. No pneumothorax seen. Probable small bilateral pleural effusions. IMPRESSION: 1. Continued bilateral pulmonary edema pattern, not significantly changed compared to most recent chest x-ray of 01/28/2018. 2. Probable small bilateral pleural effusions. 3. Cardiomegaly. 4. Support apparatus appears stable in position. Endotracheal tube tip is at the level of the clavicles, approximately 6 cm above the carina. Electronically Signed   By: Franki Cabot M.D.   On: 01/30/2018 09:37   Dg Chest Port 1 View  Result Date: 01/28/2018 CLINICAL DATA:  Line placement EXAM: PORTABLE CHEST 1 VIEW COMPARISON:  01/28/2018 FINDINGS: Endotracheal tube measures 6.8 cm above the carina. A left central venous catheter has been placed with tip over the confluence of the brachiocephalic vein and IVC. Shallow inspiration. Cardiac enlargement. Bilateral perihilar infiltrates.  Probable small right pleural effusion. No pneumothorax is visible. IMPRESSION: Appliances appear in satisfactory position. Cardiac enlargement with bilateral perihilar infiltrates. Small right pleural effusion. No pneumothorax. Electronically Signed   By: Lucienne Capers M.D.   On: 01/28/2018 05:52   Dg Chest Portable 1 View  Result Date: 01/28/2018 CLINICAL DATA:  Short of breath EXAM: PORTABLE CHEST 1 VIEW COMPARISON:  01/24/2018, 01/21/2018 FINDINGS: Interval intubation, tip of the endotracheal tube is about 4 cm superior to the carina. Worsening bilateral interstitial and alveolar disease. No large effusion. Stable mild cardiomegaly. No pneumothorax. IMPRESSION: 1. Endotracheal tube tip about 3.5 cm superior to the carina. 2. Cardiomegaly. Interim worsening of bilateral interstitial and alveolar airspace disease. Electronically Signed   By: Donavan Foil M.D.   On: 01/28/2018 00:29   Dg Chest Port 1 View  Result Date: 01/24/2018 CLINICAL DATA:  Respiratory failure EXAM: PORTABLE CHEST 1 VIEW COMPARISON:  01/21/2018, 01/20/2018, 12/13/2017 FINDINGS: Removal of the endotracheal tube. Slight increased upper lobe ground-glass opacity. Improved aeration at the bases. Stable slightly enlarged cardiomediastinal silhouette. No pneumothorax. IMPRESSION: 1. Removal of endotracheal tube 2. Improved aeration at the bilateral lung bases. Slight interval increase in bilateral upper lobe ground-glass opacity. 3. Stable mild cardiomegaly Electronically Signed   By: Donavan Foil M.D.   On: 01/24/2018 03:58   Dg Chest Port 1 View  Result Date: 01/21/2018 CLINICAL DATA:  Acute respiratory failure. EXAM: PORTABLE CHEST 1 VIEW COMPARISON:  01/20/2018 FINDINGS: The endotracheal tube is in good position at the mid tracheal level. The lungs demonstrate improved aeration with resolving edema and atelectasis. Probable persistent small layering right pleural effusion. IMPRESSION: Stable position of the endotracheal tube.  Improved lung aeration with resolving edema and atelectasis. Electronically Signed   By: Marijo Sanes M.D.   On: 01/21/2018 08:36   Dg Chest Portable 1 View  Result Date: 01/20/2018 CLINICAL DATA:  57 y/o  M; status post intubation. EXAM: PORTABLE CHEST 1 VIEW COMPARISON:  01/20/2018 chest radiograph FINDINGS: Stable cardiomegaly given projection and technique. Interstitial and alveolar pulmonary edema. Probable small effusions. Endotracheal tube tip 4.5 cm above the carina. Transcutaneous pacing pads noted. Bones are unremarkable. IMPRESSION: 1. Endotracheal tube tip 4.5 cm above the carina. 2. Stable cardiomegaly and pulmonary edema. Probable small effusions. Electronically Signed   By: Kristine Garbe M.D.   On: 01/20/2018 03:34   Dg Chest [facility]  Result Date: 01/20/2018 CLINICAL DATA:  Respiratory distress EXAM: PORTABLE CHEST 1 VIEW COMPARISON:  12/13/2017 FINDINGS: Deviated trachea to the right secondary to known left thyromegaly. Stable cardiomegaly. Nonaneurysmal thoracic aorta. Diffuse bilateral airspace disease and vascular congestion consistent with pulmonary edema. No significant effusion or pneumothorax. No acute osseous abnormality. IMPRESSION: Stable cardiomegaly with pulmonary edema. Superimposed pneumonia is not entirely excluded but believed less likely. Electronically Signed   By: Ashley Royalty M.D.   On: 01/20/2018 02:56   Dg Addison Bailey G Tube Plc W/fl W/rad  Result Date: 01/20/2018 CLINICAL DATA:  Patient presents for Dobbhoff tube placement. EXAM: NASO G TUBE PLACEMENT WITH FL AND WITH RAD FLUOROSCOPY TIME:  Fluoroscopy Time:  0.3 minute Radiation Exposure Index (if provided by the fluoroscopic device): 1.3 mGy Number of Acquired Spot Images: 0 COMPARISON:  None. FINDINGS: Multiple attempts at placing a Dobbhoff tube were made through the right and left nare. The tip of the Dobbhoff tube would not progressed beyond the level of the mid trachea on repeated attempts. Attempts  were made at advancing the Dobbhoff tube following deflating the tracheostomy cuff, but the Dobbhoff tube could not advance any further. Examination was terminated at this time. IMPRESSION: 1. Multiple unsuccessful attempts at placing a Dobbhoff tube which would not progressed beyond the level of the mid trachea Electronically Signed   By: Kathreen Devoid   On: 01/20/2018 16:41    Consults: Treatment Team:  Teodoro Spray, MD Leotis Pain, MD Anthonette Legato, MD   Subjective:    Overnight Issues: patient has been completely rewarmed presently on mechanical ventilation, CRRT, status post CT scan, please see radiology report pending EEG  Objective:  Vital signs for last 24 hours: Temp:  [95.9 F (35.5 C)-99 F (37.2 C)] 95.9 F (35.5 C) (07/30 0600) Pulse Rate:  [100-109] 100 (07/30 0600) Resp:  [18-42] 34 (07/30 0600) BP: (105-162)/(69-99) 140/87 (07/30 0600) SpO2:  [73 %-100 %] 97 % (07/30 0811) Arterial Line BP: (106-178)/(56-84) 158/73 (07/30 0600) FiO2 (%):  [60 %] 60 % (07/30 0811) Weight:  [262 lb 2 oz (118.9 kg)] 262 lb 2 oz (118.9 kg) (07/30 0500)  Hemodynamic parameters for last 24 hours: CVP:  [12 mmHg-30 mmHg] 24 mmHg  Intake/Output from previous day: 07/29 0701 - 07/30 0700 In: 3590.4 [I.V.:3172.9; IV Piggyback:417.5] Out: 170 [Urine:170]  Intake/Output this shift: No intake/output data recorded.  Vent settings for last 24 hours: Vent Mode: PRVC FiO2 (%):  [60 %] 60 % Set Rate:  [25 bmp] 25 bmp Vt Set:  [500 mL] 500 mL PEEP:  [10 cmH20] 10 cmH20 Plateau Pressure:  [23 cmH20-30 cmH20] 28 cmH20  Physical Exam:  Vital signs: . Please see the above listed vital signs HEENT: Patient is orally intubated, trachea is midline, no accessory muscle utilization Cardiovascular: Regular rate and rhythm Pulmonary: Coarse rhonchi Abdominal: Hypoactive bowel sounds, soft exam, or tics on in place Extremities: No clubbing cyanosis or edema noted Neurologic: Patient with  no pupillary response, no corneals noted, no gag appreciated and unresponsive. Only myoclonic jerking noted  Assessment/Plan:   Status post cardiac arrest. Patient status post targeted temperature management, concerning for severe anoxic brain injury, off sedation. Appreciate Dr. Doy Mince assistance with neurologic evaluation, repeat CT scan performed yesterday  Diffuse blurring of the gray-white interface, compatible with global edema, slightly progressed compared to 01/28/2018. Mild worsening of basal cisternal crowding.  Pending EEG results  History of severe cardiomyopathy with systolic failure. Measured ejection fraction of 15% on echocardiogram yesterday  Renal failure. Presently on CRRT greatly appreciate nephrology's assistance, BUN 42 creatinine 3.83,  Ongoing discussions with family regarding goals of care at this point they wish to continue aggressive intervention  Critical care time 40 minutes   Koltyn Kelsay 01/31/2018  *Care during the described time interval was provided by me and/or other providers on the critical care team.  I have reviewed this patient's available data, including medical history, events of note, physical examination and test results as part of my evaluation. Patient ID: MELVERN RAMONE, male   DOB: 1960-12-14, 57 y.o.   MRN: [id number]

## 2018-01-31 NOTE — Progress Notes (Signed)
Subjective: Patient remains intubated and unresponsive.  Off sedation since 8p last evening.    Objective: Current vital signs: BP (!) 168/101   Pulse (!) 115   Temp (!) 97.5 F (36.4 C) (Bladder)   Resp (!) 32   Ht 6' 1"  (1.854 m)   Wt 118.9 kg (262 lb 2 oz)   SpO2 99%   BMI 34.58 kg/m  Vital signs in last 24 hours: Temp:  [95.9 F (35.5 C)-99 F (37.2 C)] 97.5 F (36.4 C) (07/30 0900) Pulse Rate:  [100-115] 115 (07/30 0900) Resp:  [18-42] 32 (07/30 0900) BP: (105-168)/(69-101) 168/101 (07/30 0900) SpO2:  [73 %-100 %] 99 % (07/30 0900) Arterial Line BP: (106-187)/(56-84) 187/84 (07/30 0900) FiO2 (%):  [60 %] 60 % (07/30 0811) Weight:  [118.9 kg (262 lb 2 oz)] 118.9 kg (262 lb 2 oz) (07/30 0500)  Intake/Output from previous day: 07/29 0701 - 07/30 0700 In: 3590.4 [I.V.:3172.9; IV Piggyback:417.5] Out: 170 [Urine:170] Intake/Output this shift: Total I/O In: 30 [I.V.:30] Out: -  Nutritional status:  Diet Order           Diet NPO time specified  Diet effective now          Neurologic Exam: Mental Status: Patient does not respond to verbal stimuli.  Does not respond to deep sternal rub.  Does not follow commands.  No verbalizations noted.  Cranial Nerves: II: patient does not respond confrontation bilaterally, pupils right 4 mm, left 4 mm,and reactive bilaterally III,IV,VI: doll's response present bilaterally. V,VII: corneal reflex present on the left  VIII: patient does not respond to verbal stimuli IX,X: gag reflex absent, XI: trapezius strength unable to test bilaterally XII: tongue strength unable to test Motor: Extremities flaccid throughout.  No purposeful movements noted.  Twitching noted of the toes bilaterally Sensory: Does not respond to noxious stimuli in any extremity. Deep Tendon Reflexes:  1+ at the biceps, absent otherwise.   Lab Results: Basic Metabolic Panel: Recent Labs  Lab 01/29/18 0210  01/30/18 0850 01/30/18 1613 01/30/18 2003  01/30/18 2006 01/30/18 2354 01/31/18 0450  NA 139   < > 136 138 135  --  137 138  K 3.1*   < > 4.6 4.8 4.5  --  4.3 4.4  CL 109   < > 102 100 101  --  102 102  CO2 20*   < > 20* 22 22  --  23 26  GLUCOSE 167*   < > 225* 184* 146*  --  135* 119*  BUN 33*   < > 46* 48* 46*  --  46* 42*  CREATININE 2.39*   < > 4.31* 4.70* 4.50*  --  4.26* 3.83*  CALCIUM 7.8*   < > 7.3* 7.3* 7.1*  --  7.3* 7.7*  MG 2.0  --   --  1.8  --  1.4* 2.4 2.1  PHOS 5.5*  --   --  8.0* 7.0*  --  6.5* 6.3*   < > = values in this interval not displayed.    Liver Function Tests: Recent Labs  Lab 01/28/18 0034 01/28/18 0433 01/30/18 1613 01/30/18 2003 01/30/18 2354 01/31/18 0450  AST 58* 59*  --   --   --   --   ALT 31 36  --   --   --   --   ALKPHOS 49 54  --   --   --   --   BILITOT 0.5 1.0  --   --   --   --  PROT 5.8* 6.7  --   --   --   --   ALBUMIN 2.2* 2.6* 2.2* 2.2* 1.9* 2.1*   No results for input(s): LIPASE, AMYLASE in the last 168 hours. No results for input(s): AMMONIA in the last 168 hours.  CBC: Recent Labs  Lab 01/28/18 0034 01/28/18 0432 01/29/18 0210  WBC 22.5* 33.8* 25.9*  HGB 10.3* 11.9* 10.3*  HCT 30.9* 34.5* 30.1*  MCV 96.2 93.5 92.2  PLT 288 268 113*    Cardiac Enzymes: Recent Labs  Lab 01/28/18 0433 01/28/18 0816 01/28/18 1440 01/28/18 2151 01/29/18 0210 01/30/18 0006  CKTOTAL  --   --   --   --   --  385  TROPONINI 0.50* 0.90* 1.17* 1.36* 1.35*  --     Lipid Panel: Recent Labs  Lab 01/28/18 0433  TRIG 222*    CBG: Recent Labs  Lab 01/30/18 1726 01/30/18 2004 01/30/18 2332 01/31/18 0411 01/31/18 0728  GLUCAP 156* 140* 130* 104* 113*    Microbiology: Results for orders placed or performed during the hospital encounter of 01/16/2018  MRSA PCR Screening     Status: None   Collection Time: 01/28/18  5:00 AM  Result Value Ref Range Status   MRSA by PCR NEGATIVE NEGATIVE Final    Comment:        The GeneXpert MRSA Assay (FDA approved for NASAL  specimens only), is one component of a comprehensive MRSA colonization surveillance program. It is not intended to diagnose MRSA infection nor to guide or monitor treatment for MRSA infections. Performed at [facility], [address], [place], [place] [address]   Urine Culture     Status: None   Collection Time: 01/28/18  6:16 AM  Result Value Ref Range Status   Specimen Description   Final    URINE, RANDOM Performed at [facility], [address] [place], [place] [address]    Special Requests   Final    NONE Performed at [facility], [address], [place], [place] [address]    Culture   Final    NO GROWTH Performed at [facility], [place] [address] [place], [place] [address]    Report Status 01/29/2018 FINAL  Final  Culture, blood (Routine X 2) w Reflex to ID Panel     Status: None (Preliminary result)   Collection Time: 01/28/18  6:45 AM  Result Value Ref Range Status   Specimen Description BLOOD LAC  Final   Special Requests   Final    BOTTLES DRAWN AEROBIC AND ANAEROBIC Blood Culture adequate volume   Culture   Final    NO GROWTH 3 DAYS Performed at [facility], [address] [place],  [address]    Report Status PENDING  Incomplete  Culture, blood (Routine X 2) w Reflex to ID Panel     Status: None (Preliminary result)   Collection Time: 01/28/18  6:56 AM  Result Value Ref Range Status   Specimen Description BLOOD RAC  Final   Special Requests   Final    BOTTLES DRAWN AEROBIC AND ANAEROBIC Blood Culture results may not be optimal due to an inadequate volume of blood received in culture bottles   Culture   Final    NO GROWTH 3 DAYS Performed at [facility], [address], [place],  [address]    Report Status PENDING  Incomplete  Culture, respiratory (non-expectorated)     Status: None   Collection Time:  01/28/18 11:24 AM  Result Value Ref Range Status   Specimen Description   Final     TRACHEAL ASPIRATE Performed at [facility], [address] [place], [place] [address]    Special Requests   Final    NONE Performed at [facility], [place], [place] [address]    Gram Stain   Final    RARE WBC PRESENT,BOTH PMN AND MONONUCLEAR RARE GRAM POSITIVE COCCI IN PAIRS    Culture   Final    RARE Consistent with normal respiratory flora. Performed at [facility], [place] [address] [place], [place] [address]    Report Status 01/30/2018 FINAL  Final    Coagulation Studies: Recent Labs    01/28/18 1145  LABPROT 16.7*  INR 1.36    Imaging: Ct Head Wo Contrast  Result Date: 01/30/2018 CLINICAL DATA:  Anoxic brain injury EXAM: CT HEAD WITHOUT CONTRAST TECHNIQUE: Contiguous axial images were obtained from the base of the skull through the vertex without intravenous contrast. COMPARISON:  Head CT 01/28/2018 FINDINGS: Brain: There is diffuse blurring of the gray-white interface, slightly greater than on the prior examination. There is crowding of the basal cisterns. No acute hemorrhage or other extra-axial collection. Vascular: No abnormal hyperdensity of the major intracranial arteries or dural venous sinuses. No intracranial atherosclerosis. Skull: The visualized skull base, calvarium and extracranial soft tissues are normal. Sinuses/Orbits: No fluid levels or advanced mucosal thickening of the visualized paranasal sinuses. No mastoid or middle ear effusion. The orbits are normal. IMPRESSION: Diffuse blurring of the gray-white interface, compatible with global edema, slightly progressed compared to 01/28/2018. Mild worsening of basal cisternal crowding. Electronically Signed   By: Ulyses Jarred M.D.   On: 01/30/2018 13:17   Dg Chest Port 1 View  Result Date: 01/30/2018 CLINICAL DATA:  Central line placement EXAM: PORTABLE CHEST 1 VIEW COMPARISON:  01/30/2018 at 0918 hours FINDINGS: Endotracheal tube terminates 6 cm above the carina. Right  IJ dual lumen catheter terminates in the lower SVC. Left IJ venous catheter terminates in the left brachiocephalic vein. Cardiomegaly with mild to moderate interstitial edema. Multifocal pneumonia is also possible. Suspected bilateral pleural effusions, right greater than left. No pneumothorax. IMPRESSION: Endotracheal tube terminates 6 cm above the carina. Right IJ catheter terminates in the lower SVC. Left IJ catheter terminates in the left brachiocephalic vein. Cardiomegaly with suspected mild to moderate interstitial edema and bilateral pleural effusions. Multifocal pneumonia is also possible. Electronically Signed   By: Julian Hy M.D.   On: 01/30/2018 21:42   Dg Chest Port 1 View  Result Date: 01/30/2018 CLINICAL DATA:  57 y.o. male.with a history of type 2 diabetes hypertension and sleep apnea who was brought to the ED by his spouse in cardiac and respiratory arrest. now on vent, smoker EXAM: PORTABLE CHEST 1 VIEW COMPARISON:  Chest x-rays dated 01/28/2018 and 01/24/2018. FINDINGS: Study is hypoinspiratory. There is continued bilateral pulmonary edema pattern, not significantly changed compared to the most recent study of 01/28/2018, increased compared to the earlier study of 01/24/2018. Given the low lung volumes, heart size and mediastinal contours appear stable. Endotracheal tube tip is at the level of the clavicles, approximately 6 cm above the carina. LEFT jugular vein central line is stable in position with tip at the level of the upper SVC. No pneumothorax seen. Probable small bilateral pleural effusions. IMPRESSION: 1. Continued bilateral pulmonary edema pattern, not significantly changed compared to most recent chest x-ray of 01/28/2018. 2. Probable  small bilateral pleural effusions. 3. Cardiomegaly. 4. Support apparatus appears stable in position. Endotracheal tube tip is at the level of the clavicles, approximately 6 cm above the carina. Electronically Signed   By: Franki Cabot M.D.    On: 01/30/2018 09:37    Medications:  I have reviewed the patient's current medications. Scheduled: . artificial tears  1 application Both Eyes B3Z  . chlorhexidine gluconate (MEDLINE KIT)  15 mL Mouth Rinse BID  . heparin injection (subcutaneous)  5,000 Units Subcutaneous Q8H  . insulin aspart  0-20 Units Subcutaneous Q4H  . insulin glargine  5 Units Subcutaneous QHS  . ipratropium-albuterol  3 mL Nebulization Q6H  . mouth rinse  15 mL Mouth Rinse 10 times per day  . sodium chloride flush  10-40 mL Intracatheter Q12H    Assessment/Plan: Patient remains intubated, unresponsive.  Off sedation since 8p last evening.  Receiving CRRT.  On neurological examination does not fulfill criteria for brain death.  Repeat head CT reviewed and shows further evidence of diffuse anoxic injury with blurring of the gray-white junction bilaterally and crowding of the basal cisterns.  EEG with markedly attenuated background but patient on sedation at time of recording.  Discussed patient's current condition and prognosis with wife.  She wishes to continue fully at this time to include eventual PEG and trach.     Recommendations: 1. Will continue to follow with you.     LOS: 3 days   Alexis Goodell, MD Neurology [phone] 01/31/2018  10:21 AM

## 2018-01-31 NOTE — Progress Notes (Addendum)
Right femoral temporary vascular catheter removed per policy and procedure. Purple tip intact.  No complications.

## 2018-01-31 NOTE — Progress Notes (Signed)
[facility]  ROUNDING NOTE   Subjective:  Patient remains critically ill at the moment. Urine output was only 170 cc over the preceding 24 hours. Patient has been started on CRRT. Metabolic acidosis has improved as pH is currently 7.36.   Objective:  Vital signs in last 24 hours:  Temp:  [95.9 F (35.5 C)-99 F (37.2 C)] 95.9 F (35.5 C) (07/30 0600) Pulse Rate:  [100-109] 100 (07/30 0600) Resp:  [18-42] 34 (07/30 0600) BP: (105-162)/(69-99) 140/87 (07/30 0600) SpO2:  [73 %-100 %] 97 % (07/30 0811) Arterial Line BP: (106-178)/(56-84) 158/73 (07/30 0600) FiO2 (%):  [60 %] 60 % (07/30 0811) Weight:  [118.9 kg (262 lb 2 oz)] 118.9 kg (262 lb 2 oz) (07/30 0500)  Weight change: 0.8 kg (1 lb 12.2 oz) Filed Weights   01/29/18 0500 01/30/18 0406 01/31/18 0500  Weight: 116.2 kg (256 lb 2.8 oz) 118.1 kg (260 lb 5.8 oz) 118.9 kg (262 lb 2 oz)    Intake/Output: I/O last 3 completed shifts: In: 6111.9 [I.V.:5484.4; IV Piggyback:627.5] Out: 193 [Urine:193]   Intake/Output this shift:  No intake/output data recorded.  Physical Exam: General: Critically ill appearing  Head: Normocephalic, atraumatic. Moist oral mucosal membranes  Eyes: Anicteric  Neck: Supple, trachea midline  Lungs:  Clear to auscultation, vent assisted  Heart: S1S2 no rubs  Abdomen:  Soft, nontender, bowel sounds present  Extremities: 1+ peripheral edema.  Neurologic: Comatose  Skin: No lesions  Access: Right femoral dialysis catheter    Basic Metabolic Panel: Recent Labs  Lab 01/29/18 0210  01/30/18 0850 01/30/18 1613 01/30/18 2003 01/30/18 2006 01/30/18 2354 01/31/18 0450  NA 139   < > 136 138 135  --  137 138  K 3.1*   < > 4.6 4.8 4.5  --  4.3 4.4  CL 109   < > 102 100 101  --  102 102  CO2 20*   < > 20* 22 22  --  23 26  GLUCOSE 167*   < > 225* 184* 146*  --  135* 119*  BUN 33*   < > 46* 48* 46*  --  46* 42*  CREATININE 2.39*   < > 4.31* 4.70* 4.50*  --  4.26* 3.83*  CALCIUM  7.8*   < > 7.3* 7.3* 7.1*  --  7.3* 7.7*  MG 2.0  --   --  1.8  --  1.4* 2.4 2.1  PHOS 5.5*  --   --  8.0* 7.0*  --  6.5* 6.3*   < > = values in this interval not displayed.    Liver Function Tests: Recent Labs  Lab 01/28/18 0034 01/28/18 0433 01/30/18 1613 01/30/18 2003 01/30/18 2354 01/31/18 0450  AST 58* 59*  --   --   --   --   ALT 31 36  --   --   --   --   ALKPHOS 49 54  --   --   --   --   BILITOT 0.5 1.0  --   --   --   --   PROT 5.8* 6.7  --   --   --   --   ALBUMIN 2.2* 2.6* 2.2* 2.2* 1.9* 2.1*   No results for input(s): LIPASE, AMYLASE in the last 168 hours. No results for input(s): AMMONIA in the last 168 hours.  CBC: Recent Labs  Lab 01/24/18 0910 01/28/18 0034 01/28/18 0432 01/29/18 0210  WBC 17.2* 22.5* 33.8* 25.9*  NEUTROABS  10.6*  --   --   --   HGB 12.1* 10.3* 11.9* 10.3*  HCT 36.0* 30.9* 34.5* 30.1*  MCV 93.4 96.2 93.5 92.2  PLT 233 288 268 113*    Cardiac Enzymes: Recent Labs  Lab 01/28/18 0433 01/28/18 0816 01/28/18 1440 01/28/18 2151 01/29/18 0210 01/30/18 0006  CKTOTAL  --   --   --   --   --  385  TROPONINI 0.50* 0.90* 1.17* 1.36* 1.35*  --     BNP: Invalid input(s): POCBNP  CBG: Recent Labs  Lab 01/30/18 1726 01/30/18 2004 01/30/18 2332 01/31/18 0411 01/31/18 0728  GLUCAP 156* 140* 130* 104* 113*    Microbiology: Results for orders placed or performed during the hospital encounter of 01/22/2018  MRSA PCR Screening     Status: None   Collection Time: 01/28/18  5:00 AM  Result Value Ref Range Status   MRSA by PCR NEGATIVE NEGATIVE Final    Comment:        The GeneXpert MRSA Assay (FDA approved for NASAL specimens only), is one component of a comprehensive MRSA colonization surveillance program. It is not intended to diagnose MRSA infection nor to guide or monitor treatment for MRSA infections. Performed at [facility], [address], [place], [place] [address]   Urine Culture     Status: None    Collection Time: 01/28/18  6:16 AM  Result Value Ref Range Status   Specimen Description   Final    URINE, RANDOM Performed at [facility], [address], [place], [place] [address]    Special Requests   Final    NONE Performed at [facility], [address], [place], [place] [address]    Culture   Final    NO GROWTH Performed at [facility], [place] [address], [place], [place] [address]    Report Status 01/29/2018 FINAL  Final  Culture, blood (Routine X 2) w Reflex to ID Panel     Status: None (Preliminary result)   Collection Time: 01/28/18  6:45 AM  Result Value Ref Range Status   Specimen Description BLOOD LAC  Final   Special Requests   Final    BOTTLES DRAWN AEROBIC AND ANAEROBIC Blood Culture adequate volume   Culture   Final    NO GROWTH 3 DAYS Performed at [facility], [address], [place], [place] [address]    Report Status PENDING  Incomplete  Culture, blood (Routine X 2) w Reflex to ID Panel     Status: None (Preliminary result)   Collection Time: 01/28/18  6:56 AM  Result Value Ref Range Status   Specimen Description BLOOD RAC  Final   Special Requests   Final    BOTTLES DRAWN AEROBIC AND ANAEROBIC Blood Culture results may not be optimal due to an inadequate volume of blood received in culture bottles   Culture   Final    NO GROWTH 3 DAYS Performed at [facility], [address], [place], [place] [address]    Report Status PENDING  Incomplete  Culture, respiratory (non-expectorated)     Status: None   Collection Time: 01/28/18 11:24 AM  Result Value Ref Range Status   Specimen Description   Final    TRACHEAL ASPIRATE Performed at [facility], [address], [place], [place] [address]    Special Requests   Final    NONE Performed at [facility], [address], [place], [place] [address]  Gram Stain   Final    RARE WBC PRESENT,BOTH PMN AND MONONUCLEAR RARE GRAM POSITIVE COCCI IN  PAIRS    Culture   Final    RARE Consistent with normal respiratory flora. Performed at [facility], [place] [address] [place], [place] [address]    Report Status 01/30/2018 FINAL  Final    Coagulation Studies: Recent Labs    01/28/18 1145  LABPROT 16.7*  INR 1.36    Urinalysis: No results for input(s): COLORURINE, LABSPEC, PHURINE, GLUCOSEU, HGBUR, BILIRUBINUR, KETONESUR, PROTEINUR, UROBILINOGEN, NITRITE, LEUKOCYTESUR in the last 72 hours.  Invalid input(s): APPERANCEUR    Imaging: Ct Head Wo Contrast  Result Date: 01/30/2018 CLINICAL DATA:  Anoxic brain injury EXAM: CT HEAD WITHOUT CONTRAST TECHNIQUE: Contiguous axial images were obtained from the base of the skull through the vertex without intravenous contrast. COMPARISON:  Head CT 01/28/2018 FINDINGS: Brain: There is diffuse blurring of the gray-white interface, slightly greater than on the prior examination. There is crowding of the basal cisterns. No acute hemorrhage or other extra-axial collection. Vascular: No abnormal hyperdensity of the major intracranial arteries or dural venous sinuses. No intracranial atherosclerosis. Skull: The visualized skull base, calvarium and extracranial soft tissues are normal. Sinuses/Orbits: No fluid levels or advanced mucosal thickening of the visualized paranasal sinuses. No mastoid or middle ear effusion. The orbits are normal. IMPRESSION: Diffuse blurring of the gray-white interface, compatible with global edema, slightly progressed compared to 01/28/2018. Mild worsening of basal cisternal crowding. Electronically Signed   By: Ulyses Jarred M.D.   On: 01/30/2018 13:17   Dg Chest Port 1 View  Result Date: 01/30/2018 CLINICAL DATA:  Central line placement EXAM: PORTABLE CHEST 1 VIEW COMPARISON:  01/30/2018 at 0918 hours FINDINGS: Endotracheal tube terminates 6 cm above the carina. Right IJ dual lumen catheter terminates in the lower SVC. Left IJ venous catheter terminates in the left  brachiocephalic vein. Cardiomegaly with mild to moderate interstitial edema. Multifocal pneumonia is also possible. Suspected bilateral pleural effusions, right greater than left. No pneumothorax. IMPRESSION: Endotracheal tube terminates 6 cm above the carina. Right IJ catheter terminates in the lower SVC. Left IJ catheter terminates in the left brachiocephalic vein. Cardiomegaly with suspected mild to moderate interstitial edema and bilateral pleural effusions. Multifocal pneumonia is also possible. Electronically Signed   By: Julian Hy M.D.   On: 01/30/2018 21:42   Dg Chest Port 1 View  Result Date: 01/30/2018 CLINICAL DATA:  57 y.o. male.with a history of type 2 diabetes hypertension and sleep apnea who was brought to the ED by his spouse in cardiac and respiratory arrest. now on vent, smoker EXAM: PORTABLE CHEST 1 VIEW COMPARISON:  Chest x-rays dated 01/28/2018 and 01/24/2018. FINDINGS: Study is hypoinspiratory. There is continued bilateral pulmonary edema pattern, not significantly changed compared to the most recent study of 01/28/2018, increased compared to the earlier study of 01/24/2018. Given the low lung volumes, heart size and mediastinal contours appear stable. Endotracheal tube tip is at the level of the clavicles, approximately 6 cm above the carina. LEFT jugular vein central line is stable in position with tip at the level of the upper SVC. No pneumothorax seen. Probable small bilateral pleural effusions. IMPRESSION: 1. Continued bilateral pulmonary edema pattern, not significantly changed compared to most recent chest x-ray of 01/28/2018. 2. Probable small bilateral pleural effusions. 3. Cardiomegaly. 4. Support apparatus appears stable in position. Endotracheal tube tip is at the level of the clavicles, approximately 6 cm above the carina. Electronically Signed  By: Franki Cabot M.D.   On: 01/30/2018 09:37     Medications:   . sodium chloride    . sodium chloride 10 mL/hr at  01/30/18 2300  . sodium chloride    . ceFEPime (MAXIPIME) IV Stopped (01/31/18 0559)  . dextrose 5 % and 0.45% NaCl Stopped (01/28/18 0553)  . famotidine (PEPCID) IV Stopped (01/30/18 2204)  . fentaNYL infusion INTRAVENOUS Stopped (01/30/18 1950)  . midazolam (VERSED) infusion Stopped (01/28/18 0309)  . norepinephrine (LEVOPHED) 53m / 2545minfusion Stopped (01/30/18 2140)  . propofol (DIPRIVAN) infusion Stopped (01/30/18 061761 . pureflow 2,500 mL (01/30/18 1723)  . vancomycin    . vancomycin Stopped (01/30/18 2245)  . vasopressin (PITRESSIN) infusion - *FOR SHOCK* Stopped (01/30/18 1303)   . artificial tears  1 application Both Eyes [id number]. chlorhexidine gluconate (MEDLINE KIT)  15 mL Mouth Rinse BID  . heparin injection (subcutaneous)  5,000 Units Subcutaneous Q8H  . insulin aspart  0-20 Units Subcutaneous Q4H  . insulin glargine  5 Units Subcutaneous QHS  . ipratropium-albuterol  3 mL Nebulization Q6H  . mouth rinse  15 mL Mouth Rinse 10 times per day  . sodium chloride flush  10-40 mL Intracatheter Q12H   sodium chloride, acetaminophen **OR** acetaminophen, dextrose, fentaNYL, heparin, heparin, hydrALAZINE, sodium chloride flush, vancomycin  Assessment/ Plan:  [age].o. male  with a PMHx of diabetes mellitus type 2, hypertension, obstructive sleep apnea, [facility] evaluation of cardiac arrest that occurred at home.   1.  Acute renal failure, oligoanuric. 2.  Metabolic acidosis improved with CRRT 3.  Acute respiratory failure, on the ventilator.  4.  Cardiac arrest on hypothermia protocol. 5.  Anemia unspecified.  Plan: Patient remains critically ill at the moment.  We will continue the patient on CRRT.  Add ultrafiltration of 50 cc/h today.  Continue the patient on a 4K bath for now.  We will continue to monitor serum electrolytes closely.  Overall prognosis remains guarded given poor neurologic prognosis.  Otherwise plan as per critical care.      LOS: 3 Avaneesh Pepitone 7/30/20198:33 AM

## 2018-01-31 NOTE — Progress Notes (Signed)
Right radial arterial line removed per policy and procedure.  No complication noted.

## 2018-01-31 NOTE — Progress Notes (Signed)
[name] with cardiopulmonary attempted to move endotracheal tube.  Patient biting on ETT and tongue.  Bite removed due to high risk for pressure ulcer.  Dr. Alva Garnet notified.  One time order obtained for ativan 2mg  IV.

## 2018-02-01 ENCOUNTER — Ambulatory Visit: Admission: RE | Admit: 2018-02-01 | Payer: BLUE CROSS/BLUE SHIELD | Source: Ambulatory Visit | Admitting: Cardiology

## 2018-02-01 ENCOUNTER — Inpatient Hospital Stay: Payer: BLUE CROSS/BLUE SHIELD

## 2018-02-01 LAB — RENAL FUNCTION PANEL
ALBUMIN: 2.1 g/dL — AB (ref 3.5–5.0)
ALBUMIN: 1.8 g/dL — AB (ref 3.5–5.0)
ALBUMIN: 2.1 g/dL — AB (ref 3.5–5.0)
ANION GAP: 8 (ref 5–15)
ANION GAP: 9 (ref 5–15)
ANION GAP: 9 (ref 5–15)
Albumin: 2.1 g/dL — ABNORMAL LOW (ref 3.5–5.0)
Albumin: 2 g/dL — ABNORMAL LOW (ref 3.5–5.0)
Anion gap: 8 (ref 5–15)
Anion gap: 8 (ref 5–15)
BUN: 39 mg/dL — ABNORMAL HIGH (ref 6–20)
BUN: 40 mg/dL — ABNORMAL HIGH (ref 6–20)
BUN: 41 mg/dL — ABNORMAL HIGH (ref 6–20)
BUN: 39 mg/dL — ABNORMAL HIGH (ref 6–20)
BUN: 38 mg/dL — AB (ref 6–20)
CALCIUM: 8.3 mg/dL — AB (ref 8.9–10.3)
CALCIUM: 7.8 mg/dL — AB (ref 8.9–10.3)
CHLORIDE: 101 mmol/L (ref 98–111)
CHLORIDE: 104 mmol/L (ref 98–111)
CO2: 24 mmol/L (ref 22–32)
CO2: 24 mmol/L (ref 22–32)
CO2: 25 mmol/L (ref 22–32)
CO2: 25 mmol/L (ref 22–32)
CO2: 26 mmol/L (ref 22–32)
CREATININE: 2.64 mg/dL — AB (ref 0.61–1.24)
Calcium: 8 mg/dL — ABNORMAL LOW (ref 8.9–10.3)
Calcium: 8.2 mg/dL — ABNORMAL LOW (ref 8.9–10.3)
Calcium: 8.3 mg/dL — ABNORMAL LOW (ref 8.9–10.3)
Chloride: 104 mmol/L (ref 98–111)
Chloride: 104 mmol/L (ref 98–111)
Chloride: 102 mmol/L (ref 98–111)
Creatinine, Ser: 2.98 mg/dL — ABNORMAL HIGH (ref 0.61–1.24)
Creatinine, Ser: 2.87 mg/dL — ABNORMAL HIGH (ref 0.61–1.24)
Creatinine, Ser: 2.64 mg/dL — ABNORMAL HIGH (ref 0.61–1.24)
Creatinine, Ser: 2.63 mg/dL — ABNORMAL HIGH (ref 0.61–1.24)
GFR calc Af Amer: 29 mL/min — ABNORMAL LOW (ref >60)
GFR calc Af Amer: 29 mL/min — ABNORMAL LOW (ref >60)
GFR calc non Af Amer: 22 mL/min — ABNORMAL LOW (ref >60)
GFR calc non Af Amer: 23 mL/min — ABNORMAL LOW (ref >60)
GFR calc non Af Amer: 25 mL/min — ABNORMAL LOW (ref >60)
GFR calc non Af Amer: 25 mL/min — ABNORMAL LOW (ref >60)
GFR, EST AFRICAN AMERICAN: 25 mL/min — AB (ref >60)
GFR, EST AFRICAN AMERICAN: 26 mL/min — AB (ref >60)
GFR, EST AFRICAN AMERICAN: 29 mL/min — AB (ref >60)
GFR, EST NON AFRICAN AMERICAN: 25 mL/min — AB (ref >60)
GLUCOSE: 136 mg/dL — AB (ref 70–99)
GLUCOSE: 120 mg/dL — AB (ref 70–99)
GLUCOSE: 119 mg/dL — AB (ref 70–99)
Glucose, Bld: 151 mg/dL — ABNORMAL HIGH (ref 70–99)
Glucose, Bld: 115 mg/dL — ABNORMAL HIGH (ref 70–99)
PHOSPHORUS: 5.6 mg/dL — AB (ref 2.5–4.6)
PHOSPHORUS: 5 mg/dL — AB (ref 2.5–4.6)
PHOSPHORUS: 5.1 mg/dL — AB (ref 2.5–4.6)
PHOSPHORUS: 4.7 mg/dL — AB (ref 2.5–4.6)
POTASSIUM: 4.5 mmol/L (ref 3.5–5.1)
POTASSIUM: 4.8 mmol/L (ref 3.5–5.1)
Phosphorus: 5.3 mg/dL — ABNORMAL HIGH (ref 2.5–4.6)
Potassium: 4.4 mmol/L (ref 3.5–5.1)
Potassium: 4.4 mmol/L (ref 3.5–5.1)
Potassium: 4.2 mmol/L (ref 3.5–5.1)
SODIUM: 138 mmol/L (ref 135–145)
SODIUM: 136 mmol/L (ref 135–145)
Sodium: 134 mmol/L — ABNORMAL LOW (ref 135–145)
Sodium: 136 mmol/L (ref 135–145)
Sodium: 137 mmol/L (ref 135–145)

## 2018-02-01 LAB — BLOOD GAS, ARTERIAL
Acid-base deficit: 0.4 mmol/L (ref 0.0–2.0)
BICARBONATE: 24.8 mmol/L (ref 20.0–28.0)
FIO2: 0.4
MECHVT: 500 mL
O2 Saturation: 98.7 %
PCO2 ART: 42 mmHg (ref 32.0–48.0)
PEEP: 10 cmH2O
PO2 ART: 124 mmHg — AB (ref 83.0–108.0)
Patient temperature: 37
RATE: 25 resp/min
pH, Arterial: 7.38 (ref 7.350–7.450)

## 2018-02-01 LAB — GLUCOSE, CAPILLARY
GLUCOSE-CAPILLARY: 112 mg/dL — AB (ref 70–99)
GLUCOSE-CAPILLARY: 100 mg/dL — AB (ref 70–99)
Glucose-Capillary: 114 mg/dL — ABNORMAL HIGH (ref 70–99)
Glucose-Capillary: 116 mg/dL — ABNORMAL HIGH (ref 70–99)
Glucose-Capillary: 125 mg/dL — ABNORMAL HIGH (ref 70–99)
Glucose-Capillary: 141 mg/dL — ABNORMAL HIGH (ref 70–99)
Glucose-Capillary: 164 mg/dL — ABNORMAL HIGH (ref 70–99)
Glucose-Capillary: 134 mg/dL — ABNORMAL HIGH (ref 70–99)

## 2018-02-01 LAB — CBC WITH DIFFERENTIAL/PLATELET
Basophils Absolute: 0 10*3/uL (ref 0–0.1)
Basophils Relative: 0 %
EOS PCT: 0 %
Eosinophils Absolute: 0 10*3/uL (ref 0–0.7)
HEMATOCRIT: 26.3 % — AB (ref 40.0–52.0)
Hemoglobin: 8.9 g/dL — ABNORMAL LOW (ref 13.0–18.0)
LYMPHS ABS: 1 10*3/uL (ref 1.0–3.6)
Lymphocytes Relative: 4 %
MCH: 31.1 pg (ref 26.0–34.0)
MCHC: 34 g/dL (ref 32.0–36.0)
MCV: 91.6 fL (ref 80.0–100.0)
MONO ABS: 3.1 10*3/uL — AB (ref 0.2–1.0)
Monocytes Relative: 13 %
NEUTROS ABS: 19.7 10*3/uL — AB (ref 1.4–6.5)
Neutrophils Relative %: 83 %
PLATELETS: 100 10*3/uL — AB (ref 150–440)
RBC: 2.87 MIL/uL — AB (ref 4.40–5.90)
RDW: 13.1 % (ref 11.5–14.5)
WBC: 23.8 10*3/uL — ABNORMAL HIGH (ref 3.8–10.6)

## 2018-02-01 LAB — MAGNESIUM
MAGNESIUM: 2.2 mg/dL (ref 1.7–2.4)
MAGNESIUM: 2.2 mg/dL (ref 1.7–2.4)
MAGNESIUM: 2.3 mg/dL (ref 1.7–2.4)
Magnesium: 2 mg/dL (ref 1.7–2.4)
Magnesium: 2.2 mg/dL (ref 1.7–2.4)
Magnesium: 2.3 mg/dL (ref 1.7–2.4)

## 2018-02-01 LAB — VANCOMYCIN, RANDOM: Vancomycin Rm: 16

## 2018-02-01 LAB — TRIGLYCERIDES: Triglycerides: 219 mg/dL — ABNORMAL HIGH (ref <150)

## 2018-02-01 LAB — APTT: APTT: 35 s (ref 24–36)

## 2018-02-01 SURGERY — RIGHT/LEFT HEART CATH AND CORONARY ANGIOGRAPHY
Anesthesia: Moderate Sedation

## 2018-02-01 NOTE — Progress Notes (Signed)
Nutrition Follow-up  DOCUMENTATION CODES:   Obesity unspecified  INTERVENTION:  Pending consult to GI for PEG tube placement as family desires full scope care including trach/PEG and NGT/OGT have been unable to be placed.  Once enteral access placed, confirmed, and ready to use recommend initiating Vital 1.5 at 40 mL/hr (960 mL goal daily volume) + Pro-Stat 60 mL BID. Provides 1840 kcal, 125 grams of protein, 1920 mg potassium, 960 mg phosphorus, 730 mL H2O daily. With current propofol rate provides 2218 kcal daily.  Recommend minimum free water flush of 20-30 mL Q4hrs to maintain tube patency.  Recommend liquid MVI daily per tube as goal TF regimen does not meet 100% RDIs for vitamins/minerals.  Recommend B-complex with C QHS per tube and Ocuvite daily per tube to replace micronutrient losses on CVVHD.  NUTRITION DIAGNOSIS:   Inadequate oral intake related to inability to eat as evidenced by NPO status.  Ongoing - unable to address at this time as there is no enteral access.  GOAL:   Provide needs based on ASPEN/SCCM guidelines  Unable to meet at this time.  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   -Neurological exam consistent with diffuse anoxic brain injury, but does not fulfill criteria for brain death. -ENT was consulted for tracheostomy tube placement as family  wishes to proceed with full scope care. Plan is for tracheostomy tube placement tomorrow. -Pending GI consult for PEG tube placement.  Patient remains intubated and sedated. On PRVC mode with FiO2 30%. Per discussion with RN propofol gtt was added to help with myoclonus. On [employer]. On CVVHD with UF 50 mL/hr. Patient was rewarmed as of 7/28 at 2145. Will continue to use weight of 103.9 kg from 01/23/2018 to estimate needs as patient remains volume overloaded. Will re-estimate needs with PSU 2003b as hypocaloric feeding not likely to benefit this patient in setting of prognosis.  Still no enteral access as NGT/OGT have not been able to be placed.  MAP: 98-111 mmHg  Patient is currently intubated on ventilator support Ve: 12.4 L/min Temp (24hrs), Avg:97.5 F (36.4 C), Min:95.4 F (35.2 C), Max:99 F (37.2 C)  Propofol: 14.3 ml/hr (378 kcal daily)  Medications reviewed and include: Novolog 0-20 units Q4hrs, Lantus 5 units QHS, metoprolol, cefepime, famotidine, propofol gtt, vancomycin.  Labs reviewed: CBG 112, BUN 41, Creatinine 2.63, Phosphorus 5.1.  I/O: 165 mL UOP yesterday  Weight trend: 124.7 kg on 7/31; +14.1 kg from admission  Discussed with RN and on rounds.  Diet Order:   Diet Order           Diet NPO time specified  Diet effective now          EDUCATION NEEDS:   No education needs have been identified at this time  Skin:  Skin Assessment: Reviewed RN Assessment(ecchymosis to abdomen)  Last BM:  Unknown/PTA  Height:   Ht Readings from  Last 1 Encounters:  01/29/18 6\' 1"  (1.854 m)    Weight:   Wt Readings from Last 1 Encounters:  02/01/18 274 lb 14.6 oz (124.7 kg)    Ideal Body Weight:  83.6 kg  BMI:  Body mass index is 36.27 kg/m.  Estimated Nutritional Needs:   Kcal:  2229 (PSU 2003b w/ MSJ 1921, Ve 12.4, Tmax 37.2)  Protein:  110-130 grams (1.3-1.6 grams/kg IBW)  Fluid:  2 L/day (25 mL/kg IBW)  Willey Blade, MS, RD, LDN Office:  [phone] Pager: [phone] After Hours/Weekend Pager: [phone]

## 2018-02-01 NOTE — Progress Notes (Signed)
CRRT filter clotted; paused at 1225.  CRRT resumed at 1552 with no complications.

## 2018-02-01 NOTE — Progress Notes (Signed)
Pt's ETT was advanced 2cm to 25 at the lip per Dr. Council Mechanic order.

## 2018-02-01 NOTE — Progress Notes (Signed)
Patient's RR in the 40's with increased WOB, no sedation ordered. Hinton Dyer, NP notified. Propofol gtt ordered and started. Patient resting comfortably now with RR in 20's. Will continue to monitor. Wilnette Kales

## 2018-02-01 NOTE — Consult Note (Signed)
[name], [name] [birth year] Tyler Leber, MD  Reason for Consult: Need to long term ventilatory care  HPI: 57 y.o. Male with history of anoxic brain injury on 01/30/2018.  Consulted for need for long term ventilatory support.  History of acute renal failure and 15% EF as well.  Allergies:  Allergies  Allergen Reactions  . Enalapril Maleate Anaphylaxis  . Iodinated Diagnostic Agents Itching  . Isosorbide Mononitrate Er [Isosorbide Dinitrate] Other (See Comments)    "made me not be able to think or function"  . Canagliflozin Other (See Comments)    ROS: Review of systems normal other than 12 systems except per HPI.  PMH:  Past Medical History:  Diagnosis Date  . Chest pain 2019  . Diabetes mellitus without complication ([place])   . Hypertension   . Sleep apnea     FH:  Family History  Problem Relation Age of Onset  . Diabetes Father   . Colon cancer Neg Hx     SH:  Social History   Socioeconomic History  . Marital status: Married    Spouse name: Not on file  . Number of children: Not on file  . Years of education: Not on file  . Highest education level: Not on file  Occupational History  . Not on file  Social Needs  . Financial resource strain: Not on file  . Food insecurity:    Worry: Not on file    Inability: Not on file  . Transportation needs:    Medical: Not on file    Non-medical: Not on file  Tobacco Use  . Smoking status: Current Every Day Smoker    Packs/day: 0.30    Years: 32.00    Pack years: 9.60    Types: Cigarettes  . Smokeless tobacco: Never Used  Substance and Sexual Activity  . Alcohol use: No  . Drug use: Never  . Sexual activity: Not Currently  Lifestyle  . Physical activity:    Days per week: Not on file    Minutes per session: Not on file  . Stress: Not on file  Relationships  . Social connections:    Talks on phone: Not on file    Gets together: Not on file    Attends religious service: Not on file    Active  member of club or organization: Not on file    Attends meetings of clubs or organizations: Not on file    Relationship status: Not on file  . Intimate partner violence:    Fear of current or ex partner: Not on file    Emotionally abused: Not on file    Physically abused: Not on file    Forced sexual activity: Not on file  Other Topics Concern  . Not on file  Social History Narrative  . Not on file    PSH:  Past Surgical History:  Procedure Laterality Date  . COLONOSCOPY  2017  . 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  Patient a tracheostomy tube candidate but is high risk due to low EF as well as recent brain injury.  Discussed risks and benefits with wife and decision to proceed with tracheostomy tube tomorrow.  Please hold any feeds overnight/anticoagulation if applicable.    [name] 02/01/2018 1:23 PM

## 2018-02-01 NOTE — Anesthesia Preprocedure Evaluation (Addendum)
Anesthesia Evaluation  Patient identified by MRN, date of birth, ID band Patient awake    Reviewed: Allergy & Precautions, H&P , NPO status , Patient's Chart, lab work & pertinent test results, reviewed documented beta blocker date and time , Unable to perform ROS - Chart review only  History of Anesthesia Complications Negative for: history of anesthetic complications  Airway Mallampati: Intubated  TM Distance: >3 FB Neck ROM: full    Dental  (+) Poor Dentition   Pulmonary neg pulmonary ROS, sleep apnea , Current Smoker,    Pulmonary exam normal        Cardiovascular hypertension, Pt. on medications +CHF  negative cardio ROS Normal cardiovascular exam Rhythm:regular Rate:Normal     Neuro/Psych negative neurological ROS  negative psych ROS   GI/Hepatic negative GI ROS, Neg liver ROS,   Endo/Other  negative endocrine ROSdiabetes, Well Controlled  Renal/GU Renal hypertension and CRFRenal diseasenegative Renal ROS  negative genitourinary   Musculoskeletal   Abdominal   Peds  Hematology negative hematology ROS (+)   Anesthesia Other Findings Past Medical History: 2019: Chest pain No date: Diabetes mellitus without complication ([place]) No date: Hypertension No date: Sleep apnea Past Surgical History: 2017: COLONOSCOPY No date: LIPOMA EXCISION     Comment:  Left forehead BMI    Body Mass Index:  36.27 kg/m   Past Surgical History: 2017: COLONOSCOPY No date: LIPOMA EXCISION     Comment:  Left forehead     Reproductive/Obstetrics negative OB ROS                           Anesthesia Physical Anesthesia Plan  ASA: IV  Anesthesia Plan: General ETT   Post-op Pain Management:    Induction: Intravenous  PONV Risk Score and Plan:   Airway Management Planned: Oral ETT  Additional Equipment:   Intra-op Plan:   Post-operative Plan: Post-operative  intubation/ventilation  Informed Consent: I have reviewed the patients History and Physical, chart, labs and discussed the procedure including the risks, benefits and alternatives for the proposed anesthesia with the patient or authorized representative who has indicated his/her understanding and acceptance.   Dental Advisory Given  Plan Discussed with: CRNA  Anesthesia Plan Comments: (Wife, Darcel Frane, consented over the phone  Wife informed that patient is higher risk for complications from anesthesia during this procedure due to their medical history.  She voiced understanding.  Wife consented for risks of anesthesia including but not limited to:  - adverse reactions to medications - damage to teeth, lips or other oral mucosa - sore throat or hoarseness - Damage to heart, brain, lungs or loss of life  Wife voiced understanding.)       Anesthesia Quick Evaluation

## 2018-02-01 NOTE — Progress Notes (Signed)
UF at 69ml/hr started per MD orders.

## 2018-02-01 NOTE — Progress Notes (Signed)
Patient ID: [name], male   DOB: [birth year], 56 y.o.   MRN: [id number] Pulmonary/critical care attending  Family notes  Please excuse [name] on 01/28/2018 from work. His [place] was admitted into the intensive care unitat [place], D.O.

## 2018-02-01 NOTE — Progress Notes (Signed)
Subjective: Patient remains intubated and sedated.  Due to work of breathing had to be placed back on Propofol.    Objective: Current vital signs: BP (!) 141/83   Pulse 83   Temp (!) 95.5 F (35.3 C)   Resp (!) 25   Ht _0  (1.854 m)   Wt 124.7 kg (274 lb 14.6 oz)   SpO2 100%   BMI 36.27 kg/m  Vital signs in last 24 hours: Temp:  [95.5 F (35.3 C)-99 F (37.2 C)] 95.5 F (35.3 C) (07/31 0800) Pulse Rate:  [83-115] 83 (07/31 0800) Resp:  [25-41] 25 (07/31 0800) BP: (139-180)/(83-108) 141/83 (07/31 0800) SpO2:  [95 %-100 %] 100 % (07/31 0800) Arterial Line BP: (153-154)/(78-79) 154/79 (07/30 1100) FiO2 (%):  [40 %-60 %] 40 % (07/31 0313) Weight:  [124.7 kg (274 lb 14.6 oz)] 124.7 kg (274 lb 14.6 oz) (07/31 0449)  Intake/Output from previous day: 07/30 0701 - 07/31 0700 In: 886.5 [I.V.:336.5; IV Piggyback:550] Out: 165 [Urine:165] Intake/Output this shift: Total I/O In: 48.6 [I.V.:48.6] Out: -  Nutritional status:  Diet Order           Diet NPO time specified  Diet effective now          Neurologic Exam: Mental Status: Patient does not respond to verbal stimuli.  Does not respond to deep sternal rub.  Does not follow commands.  No verbalizations noted.  Cranial Nerves: II: patient does not respond confrontation bilaterally, pupils right 4 mm, left 4 mm,and reactive bilaterally III,IV,VI: doll's response present bilaterally. V,VII: corneal reflex present but reduced on the right   VIII: patient does not respond to verbal stimuli IX,X: gag reflex absent, XI: trapezius strength unable to test bilaterally XII: tongue strength unable to test Motor: Extremities flaccid throughout.  No purposeful movements noted.  Twitching noted of the toes bilaterally Sensory: Does not respond to noxious stimuli in any extremity. Deep Tendon Reflexes:  1+ at the biceps, absentotherwise.   Lab Results: Basic Metabolic Panel: Recent Labs  Lab 01/31/18 1639 01/31/18 2005  02/01/18 0059 02/01/18 0446 02/01/18 0812  NA 135 136 134* 138 136  K 4.7 4.8 4.4 4.4 4.2  CL 101 101 101 104 104  CO2 _1 GLUCOSE 129* 138* 151* 136* 120*  BUN 41* 41* 39* 40* 38*  CREATININE 3.50* 3.24* 2.98* 2.87* 2.64*  CALCIUM 7.8* 8.0* 8.0* 8.3* 7.8*  MG 2.2 2.0 2.0 2.2 2.2  PHOS 5.8* 5.8* 5.3* 5.6* 5.0*    Liver Function Tests: Recent Labs  Lab 01/28/18 0034 01/28/18 0433  01/31/18 1639 01/31/18 2005 02/01/18 0059 02/01/18 0446 02/01/18 0812  AST 58* 59*  --   --   --   --   --   --   ALT 31 36  --   --   --   --   --   --   ALKPHOS 49 54  --   --   --   --   --   --   BILITOT 0.5 1.0  --   --   --   --   --   --   PROT 5.8* 6.7  --   --   --   --   --   --   ALBUMIN 2.2* 2.6*   < > 2.2* 2.1* 2.1* 2.1* 1.8*   < > = values in this interval not displayed.   No results for input(s): LIPASE, AMYLASE in the last 168  hours. No results for input(s): AMMONIA in the last 168 hours.  CBC: Recent Labs  Lab 01/28/18 0034 01/28/18 0432 01/29/18 0210 02/01/18 0446  WBC 22.5* 33.8* 25.9* 23.8*  NEUTROABS  --   --   --  19.7*  HGB 10.3* 11.9* 10.3* 8.9*  HCT 30.9* 34.5* 30.1* 26.3*  MCV 96.2 93.5 92.2 91.6  PLT 288 268 113* 100*    Cardiac Enzymes: Recent Labs  Lab 01/28/18 0433 01/28/18 0816 01/28/18 1440 01/28/18 2151 01/29/18 0210 01/30/18 0006  CKTOTAL  --   --   --   --   --  385  TROPONINI 0.50* 0.90* 1.17* 1.36* 1.35*  --     Lipid Panel: Recent Labs  Lab 01/28/18 0433 02/01/18 0059  TRIG 222* 219*    CBG: Recent Labs  Lab 01/31/18 0411 01/31/18 0728 01/31/18 1119 01/31/18 1550 02/01/18 0718  GLUCAP 104* 113* 114* 117* 114*    Microbiology: Results for orders placed or performed during the hospital encounter of 01/13/2018  MRSA PCR Screening     Status: None   Collection Time: 01/28/18  5:00 AM  Result Value Ref Range Status   MRSA by PCR NEGATIVE NEGATIVE Final    Comment:        The GeneXpert MRSA Assay  (FDA approved for NASAL specimens only), is one component of a comprehensive MRSA colonization surveillance program. It is not intended to diagnose MRSA infection nor to guide or monitor treatment for MRSA infections. Performed at [facility], [address] [place], [place] [address]   Urine Culture     Status: None   Collection Time: 01/28/18  6:16 AM  Result Value Ref Range Status   Specimen Description   Final    URINE, RANDOM Performed at [facility], [address] [place], [place] [address]    Special Requests   Final    NONE Performed at [facility], [address] [place], [place] [address]    Culture   Final    NO GROWTH Performed at [facility], [place] [address] [place], [place] [address]    Report Status 01/29/2018 FINAL  Final  Culture, blood (Routine X 2) w Reflex to ID Panel     Status: None (Preliminary result)   Collection Time: 01/28/18  6:45 AM  Result Value Ref Range Status   Specimen Description BLOOD LAC  Final   Special Requests   Final    BOTTLES DRAWN AEROBIC AND ANAEROBIC Blood Culture adequate volume   Culture   Final    NO GROWTH 4 DAYS Performed at [facility], [address] [place], [place] [address]    Report Status PENDING  Incomplete  Culture, blood (Routine X 2) w Reflex to ID Panel     Status: None (Preliminary result)   Collection Time: 01/28/18  6:56 AM  Result Value Ref Range Status   Specimen Description BLOOD RAC  Final   Special Requests   Final    BOTTLES DRAWN AEROBIC AND ANAEROBIC Blood Culture results may not be optimal due to an inadequate volume of blood received in culture bottles   Culture   Final    NO GROWTH 4 DAYS Performed at [facility], [address] [place], [place] [address]    Report Status PENDING  Incomplete  Culture, respiratory (non-expectorated)     Status: None   Collection Time: 01/28/18 11:24 AM  Result Value Ref Range Status    Specimen  Description   Final    TRACHEAL ASPIRATE Performed at [facility], [address] [place], [place] [address]    Special Requests   Final    NONE Performed at [facility], [place], [place] [address]    Gram Stain   Final    RARE WBC PRESENT,BOTH PMN AND MONONUCLEAR RARE GRAM POSITIVE COCCI IN PAIRS    Culture   Final    RARE Consistent with normal respiratory flora. Performed at [facility], [place] [address] [place], [place] [address]    Report Status 01/30/2018 FINAL  Final    Coagulation Studies: No results for input(s): LABPROT, INR in the last 72 hours.  Imaging: Ct Head Wo Contrast  Result Date: 01/30/2018 CLINICAL DATA:  Anoxic brain injury EXAM: CT HEAD WITHOUT CONTRAST TECHNIQUE: Contiguous axial images were obtained from the base of the skull through the vertex without intravenous contrast. COMPARISON:  Head CT 01/28/2018 FINDINGS: Brain: There is diffuse blurring of the gray-white interface, slightly greater than on the prior examination. There is crowding of the basal cisterns. No acute hemorrhage or other extra-axial collection. Vascular: No abnormal hyperdensity of the major intracranial arteries or dural venous sinuses. No intracranial atherosclerosis. Skull: The visualized skull base, calvarium and extracranial soft tissues are normal. Sinuses/Orbits: No fluid levels or advanced mucosal thickening of the visualized paranasal sinuses. No mastoid or middle ear effusion. The orbits are normal. IMPRESSION: Diffuse blurring of the gray-white interface, compatible with global edema, slightly progressed compared to 01/28/2018. Mild worsening of basal cisternal crowding. Electronically Signed   By: Ulyses Jarred M.D.   On: 01/30/2018 13:17   Dg Chest Port 1 View  Result Date: 01/31/2018 CLINICAL DATA:  Check endotracheal tube EXAM: PORTABLE CHEST 1 VIEW COMPARISON:  01/30/2018 FINDINGS: Endotracheal tube is noted at the thoracic  inlet. Bilateral jugular catheters are again noted and stable. Cardiac shadow is stable but enlarged. Persistent right-sided infiltrate and effusion is seen. The left lung is clear. IMPRESSION: Right-sided effusion and infiltrate stable from the prior exam. Tubes and lines as described. Electronically Signed   By: Inez Catalina M.D.   On: 01/31/2018 10:49   Dg Chest Port 1 View  Result Date: 01/30/2018 CLINICAL DATA:  Central line placement EXAM: PORTABLE CHEST 1 VIEW COMPARISON:  01/30/2018 at 0918 hours FINDINGS: Endotracheal tube terminates 6 cm above the carina. Right IJ dual lumen catheter terminates in the lower SVC. Left IJ venous catheter terminates in the left brachiocephalic vein. Cardiomegaly with mild to moderate interstitial edema. Multifocal pneumonia is also possible. Suspected bilateral pleural effusions, right greater than left. No pneumothorax. IMPRESSION: Endotracheal tube terminates 6 cm above the carina. Right IJ catheter terminates in the lower SVC. Left IJ catheter terminates in the left brachiocephalic vein. Cardiomegaly with suspected mild to moderate interstitial edema and bilateral pleural effusions. Multifocal pneumonia is also possible. Electronically Signed   By: Julian Hy M.D.   On: 01/30/2018 21:42    Medications:  I have reviewed the patient's current medications. Scheduled: . chlorhexidine gluconate (MEDLINE KIT)  15 mL Mouth Rinse BID  . heparin injection (subcutaneous)  5,000 Units Subcutaneous Q8H  . insulin aspart  0-20 Units Subcutaneous Q4H  . insulin glargine  5 Units Subcutaneous QHS  . ipratropium-albuterol  3 mL Nebulization Q6H  . mouth rinse  15 mL Mouth Rinse 10 times per day  . metoprolol tartrate  5 mg Intravenous Q6H  . sodium chloride flush  10-40 mL  Intracatheter Q12H    Assessment/Plan: Patient unchanged from yesterday.  Still does not fulfill criteria for brain death.     LOS: 4 days   Alexis Goodell,  MD Neurology [phone] 02/01/2018  9:32 AM

## 2018-02-01 NOTE — Progress Notes (Signed)
[facility]  ROUNDING NOTE   Subjective:  Patient remains critically ill.  Urine output is minimal at the moment. Serum electrolytes appear to be acceptable.   Objective:  Vital signs in last 24 hours:  Temp:  [95.9 F (35.5 C)-99 F (37.2 C)] 95.9 F (35.5 C) (07/31 0700) Pulse Rate:  [83-115] 86 (07/31 0700) Resp:  [25-41] 25 (07/31 0700) BP: (139-180)/(83-108) 146/90 (07/31 0700) SpO2:  [95 %-100 %] 100 % (07/31 0700) Arterial Line BP: (153-187)/(77-84) 154/79 (07/30 1100) FiO2 (%):  [40 %-60 %] 40 % (07/31 0313) Weight:  [124.7 kg (274 lb 14.6 oz)] 124.7 kg (274 lb 14.6 oz) (07/31 0449)  Weight change: 5.8 kg (12 lb 12.6 oz) Filed Weights   01/30/18 0406 01/31/18 0500 02/01/18 0449  Weight: 118.1 kg (260 lb 5.8 oz) 118.9 kg (262 lb 2 oz) 124.7 kg (274 lb 14.6 oz)    Intake/Output: I/O last 3 completed shifts: In: 3638.5 [I.V.:2771; IV Piggyback:867.5] Out: 245 [Urine:245]   Intake/Output this shift:  No intake/output data recorded.  Physical Exam: General: Critically ill appearing  Head: Normocephalic, atraumatic. Moist oral mucosal membranes  Eyes: Anicteric  Neck: Supple, trachea midline  Lungs:  Clear to auscultation, vent assisted  Heart: S1S2 no rubs  Abdomen:  Soft, nontender, bowel sounds present  Extremities: 1+ peripheral edema.  Neurologic: Comatose  Skin: No lesions  Access: Right femoral dialysis catheter    Basic Metabolic Panel: Recent Labs  Lab 01/31/18 1312 01/31/18 1639 01/31/18 2005 02/01/18 0059 02/01/18 0446  NA 137 135 136 134* 138  K 4.6 4.7 4.8 4.4 4.4  CL 103 101 101 101 104  CO2 25 24 22 24 26   GLUCOSE 127* 129* 138* 151* 136*  BUN 40* 41* 41* 39* 40*  CREATININE 3.26* 3.50* 3.24* 2.98* 2.87*  CALCIUM 7.9* 7.8* 8.0* 8.0* 8.3*  MG 1.9 2.2 2.0 2.0 2.2  PHOS 5.3* 5.8* 5.8* 5.3* 5.6*    Liver Function Tests: Recent Labs  Lab 01/28/18 0034 01/28/18 0433  01/31/18 1312 01/31/18 1639 01/31/18 2005  02/01/18 0059 02/01/18 0446  AST 58* 59*  --   --   --   --   --   --   ALT 31 36  --   --   --   --   --   --   ALKPHOS 49 54  --   --   --   --   --   --   BILITOT 0.5 1.0  --   --   --   --   --   --   PROT 5.8* 6.7  --   --   --   --   --   --   ALBUMIN 2.2* 2.6*   < > 2.2* 2.2* 2.1* 2.1* 2.1*   < > = values in this interval not displayed.   No results for input(s): LIPASE, AMYLASE in the last 168 hours. No results for input(s): AMMONIA in the last 168 hours.  CBC: Recent Labs  Lab 01/28/18 0034 01/28/18 0432 01/29/18 0210 02/01/18 0446  WBC 22.5* 33.8* 25.9* 23.8*  NEUTROABS  --   --   --  19.7*  HGB 10.3* 11.9* 10.3* 8.9*  HCT 30.9* 34.5* 30.1* 26.3*  MCV 96.2 93.5 92.2 91.6  PLT 288 268 113* 100*    Cardiac Enzymes: Recent Labs  Lab 01/28/18 0433 01/28/18 0816 01/28/18 1440 01/28/18 2151 01/29/18 0210 01/30/18 0006  CKTOTAL  --   --   --   --   --  385  TROPONINI 0.50* 0.90* 1.17* 1.36* 1.35*  --     BNP: Invalid input(s): POCBNP  CBG: Recent Labs  Lab 01/31/18 0411 01/31/18 0728 01/31/18 1119 01/31/18 1550 02/01/18 0718  GLUCAP 104* 113* 114* 117* 114*    Microbiology: Results for orders placed or performed during the hospital encounter of 01/11/2018  MRSA PCR Screening     Status: None   Collection Time: 01/28/18  5:00 AM  Result Value Ref Range Status   MRSA by PCR NEGATIVE NEGATIVE Final    Comment:        The GeneXpert MRSA Assay (FDA approved for NASAL specimens only), is one component of a comprehensive MRSA colonization surveillance program. It is not intended to diagnose MRSA infection nor to guide or monitor treatment for MRSA infections. Performed at [facility], [address], [place], [place] [address]   Urine Culture     Status: None   Collection Time: 01/28/18  6:16 AM  Result Value Ref Range Status   Specimen Description   Final    URINE, RANDOM Performed at [facility], [address]  [place], [place] [address]    Special Requests   Final    NONE Performed at [facility], [address] [place], [place] [address]    Culture   Final    NO GROWTH Performed at [facility], [place] [address], [place], [place] [address]    Report Status 01/29/2018 FINAL  Final  Culture, blood (Routine X 2) w Reflex to ID Panel     Status: None (Preliminary result)   Collection Time: 01/28/18  6:45 AM  Result Value Ref Range Status   Specimen Description BLOOD LAC  Final   Special Requests   Final    BOTTLES DRAWN AEROBIC AND ANAEROBIC Blood Culture adequate volume   Culture   Final    NO GROWTH 4 DAYS Performed at [facility], [address] [place], [place] [address]    Report Status PENDING  Incomplete  Culture, blood (Routine X 2) w Reflex to ID Panel     Status: None (Preliminary result)   Collection Time: 01/28/18  6:56 AM  Result Value Ref Range Status   Specimen Description BLOOD RAC  Final   Special Requests   Final    BOTTLES DRAWN AEROBIC AND ANAEROBIC Blood Culture results may not be optimal due to an inadequate volume of blood received in culture bottles   Culture   Final    NO GROWTH 4 DAYS Performed at [facility], [address] [place], [place] [address]    Report Status PENDING  Incomplete  Culture, respiratory (non-expectorated)     Status: None   Collection Time: 01/28/18 11:24 AM  Result Value Ref Range Status   Specimen Description   Final    TRACHEAL ASPIRATE Performed at [facility], [address], [place], [place] [address]    Special Requests   Final    NONE Performed at [facility], [address] [place], [place] [address]    Gram Stain   Final    RARE WBC PRESENT,BOTH PMN AND MONONUCLEAR RARE GRAM POSITIVE COCCI IN PAIRS    Culture   Final    RARE Consistent with normal respiratory flora. Performed at [facility], [place] [address] [place], [place] [address]    Report Status  01/30/2018 FINAL  Final    Coagulation Studies: No results for input(s): LABPROT, INR  in the last 72 hours.  Urinalysis: No results for input(s): COLORURINE, LABSPEC, PHURINE, GLUCOSEU, HGBUR, BILIRUBINUR, KETONESUR, PROTEINUR, UROBILINOGEN, NITRITE, LEUKOCYTESUR in the last 72 hours.  Invalid input(s): APPERANCEUR    Imaging: Ct Head Wo Contrast  Result Date: 01/30/2018 CLINICAL DATA:  Anoxic brain injury EXAM: CT HEAD WITHOUT CONTRAST TECHNIQUE: Contiguous axial images were obtained from the base of the skull through the vertex without intravenous contrast. COMPARISON:  Head CT 01/28/2018 FINDINGS: Brain: There is diffuse blurring of the gray-white interface, slightly greater than on the prior examination. There is crowding of the basal cisterns. No acute hemorrhage or other extra-axial collection. Vascular: No abnormal hyperdensity of the major intracranial arteries or dural venous sinuses. No intracranial atherosclerosis. Skull: The visualized skull base, calvarium and extracranial soft tissues are normal. Sinuses/Orbits: No fluid levels or advanced mucosal thickening of the visualized paranasal sinuses. No mastoid or middle ear effusion. The orbits are normal. IMPRESSION: Diffuse blurring of the gray-white interface, compatible with global edema, slightly progressed compared to 01/28/2018. Mild worsening of basal cisternal crowding. Electronically Signed   By: Ulyses Jarred M.D.   On: 01/30/2018 13:17   Dg Chest Port 1 View  Result Date: 01/31/2018 CLINICAL DATA:  Check endotracheal tube EXAM: PORTABLE CHEST 1 VIEW COMPARISON:  01/30/2018 FINDINGS: Endotracheal tube is noted at the thoracic inlet. Bilateral jugular catheters are again noted and stable. Cardiac shadow is stable but enlarged. Persistent right-sided infiltrate and effusion is seen. The left lung is clear. IMPRESSION: Right-sided effusion and infiltrate stable from the prior exam. Tubes and lines as described. Electronically  Signed   By: Inez Catalina M.D.   On: 01/31/2018 10:49   Dg Chest Port 1 View  Result Date: 01/30/2018 CLINICAL DATA:  Central line placement EXAM: PORTABLE CHEST 1 VIEW COMPARISON:  01/30/2018 at 0918 hours FINDINGS: Endotracheal tube terminates 6 cm above the carina. Right IJ dual lumen catheter terminates in the lower SVC. Left IJ venous catheter terminates in the left brachiocephalic vein. Cardiomegaly with mild to moderate interstitial edema. Multifocal pneumonia is also possible. Suspected bilateral pleural effusions, right greater than left. No pneumothorax. IMPRESSION: Endotracheal tube terminates 6 cm above the carina. Right IJ catheter terminates in the lower SVC. Left IJ catheter terminates in the left brachiocephalic vein. Cardiomegaly with suspected mild to moderate interstitial edema and bilateral pleural effusions. Multifocal pneumonia is also possible. Electronically Signed   By: Julian Hy M.D.   On: 01/30/2018 21:42   Dg Chest Port 1 View  Result Date: 01/30/2018 CLINICAL DATA:  57 y.o. male.with a history of type 2 diabetes hypertension and sleep apnea who was brought to the ED by his spouse in cardiac and respiratory arrest. now on vent, smoker EXAM: PORTABLE CHEST 1 VIEW COMPARISON:  Chest x-rays dated 01/28/2018 and 01/24/2018. FINDINGS: Study is hypoinspiratory. There is continued bilateral pulmonary edema pattern, not significantly changed compared to the most recent study of 01/28/2018, increased compared to the earlier study of 01/24/2018. Given the low lung volumes, heart size and mediastinal contours appear stable. Endotracheal tube tip is at the level of the clavicles, approximately 6 cm above the carina. LEFT jugular vein central line is stable in position with tip at the level of the upper SVC. No pneumothorax seen. Probable small bilateral pleural effusions. IMPRESSION: 1. Continued bilateral pulmonary edema pattern, not significantly changed compared to most recent  chest x-ray of 01/28/2018. 2. Probable small bilateral pleural effusions. 3. Cardiomegaly. 4. Support apparatus appears stable in position. Endotracheal tube tip  is at the level of the clavicles, approximately 6 cm above the carina. Electronically Signed   By: Franki Cabot M.D.   On: 01/30/2018 09:37     Medications:   . sodium chloride    . sodium chloride 10 mL/hr at 01/30/18 2300  . sodium chloride    . ceFEPime (MAXIPIME) IV Stopped (02/01/18 0556)  . dextrose 5 % and 0.45% NaCl Stopped (01/28/18 0553)  . famotidine (PEPCID) IV Stopped (01/31/18 2219)  . propofol (DIPRIVAN) infusion 20 mcg/kg/min (02/01/18 0526)  . pureflow 3 each (02/01/18 0439)  . vancomycin    . vancomycin Stopped (01/31/18 2329)   . chlorhexidine gluconate (MEDLINE KIT)  15 mL Mouth Rinse BID  . heparin injection (subcutaneous)  5,000 Units Subcutaneous Q8H  . insulin aspart  0-20 Units Subcutaneous Q4H  . insulin glargine  5 Units Subcutaneous QHS  . ipratropium-albuterol  3 mL Nebulization Q6H  . mouth rinse  15 mL Mouth Rinse 10 times per day  . metoprolol tartrate  5 mg Intravenous Q6H  . sodium chloride flush  10-40 mL Intracatheter Q12H   sodium chloride, acetaminophen **OR** acetaminophen, dextrose, fentaNYL, heparin, heparin, hydrALAZINE, sodium chloride flush, vancomycin  Assessment/ Plan:  57 y.o. male  with a PMHx of diabetes mellitus type 2, hypertension, obstructive sleep apnea, who was admitted to [facility] on 01/13/2018 for evaluation of cardiac arrest that occurred at home.   1.  Acute renal failure, oligoanuric. 2.  Metabolic acidosis improved with CRRT 3.  Acute respiratory failure, on the ventilator.  4.  Cardiac arrest on hypothermia protocol. 5.  Anemia unspecified.  Plan: Critical illness persist at this time.  Currently BUN is 40 with a creatinine of 2.8 700 the influence of CRRT.  Urine output was only 165 cc over the preceding 24 hours.  Therefore for now we will maintain the  patient on CRRT for 1 additional day.  Consider transitioning him to hemodialysis over the next several days.  Overall very guarded prognosis given his current neurologic status.    LOS: 4 [name] 7/31/20197:41 AM

## 2018-02-01 NOTE — Progress Notes (Signed)
[facility] NAME: [name]    MR#:  [id number]  DATE OF BIRTH:  16-Mar-1961  SUBJECTIVE:   Patient remains critically ill with multiorgan failure.  Remains on CRRT.  Urine output is still fairly poor.  Now back on sedation he was having some tachypnea and worsening myoclonic jerks.  Patient's wife is at bedside.  Plan for tracheostomy tomorrow.  REVIEW OF SYSTEMS:    Review of Systems  Unable to perform ROS: Intubated    Nutrition: NPO Tolerating Diet: No  DRUG ALLERGIES:   Allergies  Allergen Reactions  . Enalapril Maleate Anaphylaxis  . Iodinated Diagnostic Agents Itching  . Isosorbide Mononitrate Er [Isosorbide Dinitrate] Other (See Comments)    "made me not be able to think or function"  . Canagliflozin Other (See Comments)    VITALS:  Blood pressure (!) 159/91, pulse (!) 107, temperature 98.4 F (36.9 C), resp. rate (!) 24, height 6\' 1"  (1.854 m), weight 124.7 kg (274 lb 14.6 oz), SpO2 94 %.  PHYSICAL EXAMINATION:   Physical Exam  GENERAL:  57 y.o.-year-old patient lying in bed intubated & Sedated.  EYES: PERRL but minimally responsive.  Patient's right eye is adducted. HEENT: Head atraumatic, normocephalic. ET and OG tubes in place.   NECK:  Supple, no jugular venous distention. No thyroid enlargement, no tenderness.  LUNGS: Normal breath sounds bilaterally, no wheezing, rales, rhonchi. No use of accessory muscles of respiration.  CARDIOVASCULAR: S1, S2 normal. No murmurs, rubs, or gallops.  ABDOMEN: Soft, nontender, nondistended. Bowel sounds present. No organomegaly or mass.  EXTREMITIES: No cyanosis, clubbing or edema b/l.    NEUROLOGIC: Sedated & Intubated  PSYCHIATRIC: Sedated & Intubated.   SKIN: No obvious rash, lesion, or ulcer.    LABORATORY PANEL:   CBC Recent Labs  Lab 02/01/18 0446  WBC 23.8*  HGB 8.9*  HCT 26.3*  PLT 100*    ------------------------------------------------------------------------------------------------------------------  Chemistries  Recent Labs  Lab 01/28/18 0433  02/01/18 1218  NA 141   < > 136  K 4.7   < > 4.5  CL 105   < > 102  CO2 27   < > 25  GLUCOSE 333*   < > 119*  BUN 23*   < > 41*  CREATININE 1.77*   < > 2.63*  CALCIUM 9.1   < > 8.2*  MG  --    < > 2.2  AST 59*  --   --   ALT 36  --   --   ALKPHOS 54  --   --   BILITOT 1.0  --   --    < > = values in this interval not displayed.   ------------------------------------------------------------------------------------------------------------------  Cardiac Enzymes Recent Labs  Lab 01/29/18 0210  TROPONINI 1.35*   ------------------------------------------------------------------------------------------------------------------  RADIOLOGY:  Dg Chest Port 1 View  Result Date: 02/01/2018 CLINICAL DATA:  Cardiac and respiratory arrest.  Post intubation. EXAM: PORTABLE CHEST 1 VIEW COMPARISON:  01/31/2018 FINDINGS: Endotracheal tube 6.9 cm above the carina. Advancement with 3-4 cm may be considered. Central lines in stable position. Enlarged globular heart.  Mediastinal contours appear intact. Bilateral pleural effusions and interstitial pulmonary edema. More focal airspace consolidation in the right lung base cannot be excluded. Osseous structures are without acute abnormality. Soft tissues are grossly normal. IMPRESSION: Endotracheal tube 6.9 cm above the carina. Advancement with 3-4 cm may be considered. Enlarged globular heart. This may be due to cardiomegaly or pericardial effusion.  Interstitial pulmonary edema with moderate in size bilateral pleural effusions. More focal airspace consolidation in the right lung base cannot be excluded. Electronically Signed   By: Fidela Salisbury M.D.   On: 02/01/2018 09:36   Dg Chest Port 1 View  Result Date: 01/31/2018 CLINICAL DATA:  Check endotracheal tube EXAM: PORTABLE CHEST 1  VIEW COMPARISON:  01/30/2018 FINDINGS: Endotracheal tube is noted at the thoracic inlet. Bilateral jugular catheters are again noted and stable. Cardiac shadow is stable but enlarged. Persistent right-sided infiltrate and effusion is seen. The left lung is clear. IMPRESSION: Right-sided effusion and infiltrate stable from the prior exam. Tubes and lines as described. Electronically Signed   By: Inez Catalina M.D.   On: 01/31/2018 10:49   Dg Chest Port 1 View  Result Date: 01/30/2018 CLINICAL DATA:  Central line placement EXAM: PORTABLE CHEST 1 VIEW COMPARISON:  01/30/2018 at 0918 hours FINDINGS: Endotracheal tube terminates 6 cm above the carina. Right IJ dual lumen catheter terminates in the lower SVC. Left IJ venous catheter terminates in the left brachiocephalic vein. Cardiomegaly with mild to moderate interstitial edema. Multifocal pneumonia is also possible. Suspected bilateral pleural effusions, right greater than left. No pneumothorax. IMPRESSION: Endotracheal tube terminates 6 cm above the carina. Right IJ catheter terminates in the lower SVC. Left IJ catheter terminates in the left brachiocephalic vein. Cardiomegaly with suspected mild to moderate interstitial edema and bilateral pleural effusions. Multifocal pneumonia is also possible. Electronically Signed   By: Julian Hy M.D.   On: 01/30/2018 21:42     ASSESSMENT AND PLAN:   57 year old male with past medical history of ischemic cardiomyopathy ejection fraction of 20 to 17%, chronic systolic CHF, Obstructive sleep apnea, hypertension, diabetes who presented to the hospital as he was found unresponsive and noted to be in pulseless cardiac arrest.  1.  Pulseless cardiac arrest-as a result of worsening respiratory failure and flash pulmonary edema. - Patient was resuscitated in the ER for about 17 minutes prior to regaining spontaneous circulation. - No further arrhythmias but prognosis is quite poor but the patient's wife wants to  continue aggressive care.  2.  Respiratory failure-patient was intubated after his cardiac arrest.  Not showing any signs of meaningful neurological recovery.  Family wants to continue aggressive care.  Patient likely needs a tracheostomy and ENT consulted and plan for trach tomorrow  3.  Acute kidney injury with oliguria-secondary to underlying cardiac arrest and cardiorenal hemodynamics. -Not improved with fluids and Levophed.  - Urine output still remains quite poor with only 165 cc over the past 24 hours.  Continue CRRT and further care as per nephrology.  4.  Altered mental status/encephalopathy-suspected to be secondary to underlying anoxic brain injury. - Neurology was consulted, they have explained to the patient's wife about patient's poor prognosis and unlikely that he would recover from this.   -As per neurology patient is not fulfill criteria for brain death.  Family and wife are hopeful. - Continue current supportive care.  --repeat CT head showing global edema, EEG showing generalized slowing possibly consistent with underlying anoxic brain injury.   Prognosis is quite poor.    5.  Sepsis/septic shock-suspected but source remains unclear.  Continue empiric vancomycin, cefepime. - pt. Off vasopressors now.  Cultures remain (-).     6.  History of ischemic cardiomyopathy with ejection fraction of 20 to 25%- patient does not appear to be volume overloaded presently.   7.  Leukocytosis-secondary to suspected sepsis.  Follow with IV antibiotic  therapy.  Patient has a very poor prognosis given his multiple comorbidities and now with multiorgan failure and likely anoxic brain injury.  Patient's wife still wants to continue aggressive care.   All the records are reviewed and case discussed with Care Management/Social Worker. Management plans discussed with the patient, family and they are in agreement.  CODE STATUS: Full code  DVT Prophylaxis: Hep SQ  TOTAL TIME TAKING CARE OF  THIS PATIENT: 30 minutes.   POSSIBLE D/C IN unclear, DEPENDING ON CLINICAL CONDITION and course   Henreitta Leber M.D on 02/01/2018 at 2:51 PM  Between 7am to 6pm - Pager - [phone]  After 6pm go to [email or web address] - password [email or web address] [facility]  Office  [phone]  CC: Primary care physician; Marguerita Merles, MD

## 2018-02-01 NOTE — Progress Notes (Signed)
Follow up - Critical Care Medicine Note  Patient Details:    [name] is an 57 y.o. male.with a history of type 2 diabetes hypertension and sleep apnea who was brought to the ED by his spouse in cardiac and respiratory arrest.    Lines, Airways, Drains: Airway 7 mm (Active)  Secured at (cm) 24 cm 01/29/2018  8:04 AM  Measured From Lips 01/29/2018  8:04 AM  Francis 01/29/2018  8:04 AM  Secured By [employer] 01/29/2018  8:04 AM  Tube Holder Repositioned Yes 01/29/2018  3:38 AM  Cuff Pressure (cm H2O) 25 cm H2O 01/29/2018  8:04 AM  Site Condition Dry 01/29/2018  8:04 AM     CVC Triple Lumen 01/28/18 Left Internal jugular (Active)  Indication for Insertion or Continuance of Line Vasoactive infusions 01/29/2018  8:00 AM  Site Assessment Clean;Dry;Intact 01/29/2018  8:00 AM  Proximal Lumen Status Infusing;Flushed;Blood return noted 01/29/2018  8:00 AM  Medial Lumen Status Infusing;Flushed;Blood return noted 01/29/2018  8:00 AM  Distal Lumen Status Infusing;Flushed;Blood return noted 01/29/2018  8:00 AM  Dressing Type Transparent;Occlusive 01/29/2018  8:00 AM  Dressing Status Clean;Dry;Intact;Antimicrobial disc in place 01/29/2018  8:00 AM  Line Care Connections checked and tightened;Zeroed and calibrated;Leveled 01/29/2018  8:00 AM  Dressing Intervention New dressing 01/28/2018  4:00 AM  Dressing Change Due 02/04/18 01/29/2018  8:00 AM     Arterial Line 01/28/18 Right Radial (Active)  Site Assessment Clean;Dry;Intact 01/29/2018  8:00 AM  Line Status Pulsatile blood flow;Positional 01/29/2018  8:00 AM  Art Line Waveform Appropriate 01/29/2018  8:00 AM  Art Line Interventions Zeroed and calibrated 01/29/2018  8:00 AM  Color/Movement/Sensation Capillary refill less than 3 sec 01/29/2018  8:00 AM  Dressing Type Transparent;Occlusive 01/29/2018  8:00 AM  Dressing Status Clean;Intact;Dry;Antimicrobial disc in place 01/29/2018  8:00 AM  Interventions New dressing;Antimicrobial disc  changed 01/28/2018  4:00 AM  Dressing Change Due 02/04/18 01/29/2018  8:00 AM     Urethral Catheter THAHN, ed tech Straight-tip;Temperature probe 16 Fr. (Active)  Indication for Insertion or Continuance of Catheter Unstable critical patients (first 24-48 hours) 01/29/2018  8:00 AM  Site Assessment Clean;Intact;Dry 01/29/2018  8:00 AM  Catheter Maintenance Bag below level of bladder;Catheter secured;Drainage bag/tubing not touching floor 01/29/2018  8:00 AM  Collection Container Standard drainage bag 01/29/2018  8:00 AM  Securement Method Other (Comment) 01/29/2018  8:00 AM  Output (mL) 0 mL 01/29/2018  8:00 AM    Anti-infectives:  Anti-infectives (From admission, onward)   Start     Dose/Rate Route Frequency Ordered Stop   01/30/18 2200  vancomycin (VANCOCIN) IVPB 1000 mg/200 mL premix     1,000 mg 200 mL/hr over 60 Minutes Intravenous Every 24 hours 01/30/18 2043 02/06/18 2159   01/30/18 0500  vancomycin (VANCOCIN) 1,250 mg in sodium chloride 0.9 % 250 mL IVPB  Status:  Discontinued     1,250 mg 166.7 mL/hr over 90 Minutes Intravenous Every 18 hours 01/29/18 0956 01/30/18 0308   01/30/18 0307  vancomycin (VANCOCIN) 1,250 mg in sodium chloride 0.9 % 250 mL IVPB     1,250 mg 166.7 mL/hr over 90 Minutes Intravenous As needed 01/30/18 0308     01/28/18 1400  vancomycin (VANCOCIN) 1,250 mg in sodium chloride 0.9 % 250 mL IVPB  Status:  Discontinued     1,250 mg 166.7 mL/hr over 90 Minutes Intravenous Every 18 hours 01/28/18 0544 01/29/18 0956   01/28/18 0600  ceFEPIme (MAXIPIME) 2 g in sodium chloride  0.9 % 100 mL IVPB     2 g 200 mL/hr over 30 Minutes Intravenous Every 12 hours 01/28/18 0544 02/04/18 0559   01/28/18 0545  vancomycin (VANCOCIN) 1,250 mg in sodium chloride 0.9 % 250 mL IVPB     1,250 mg 166.7 mL/hr over 90 Minutes Intravenous  Once 01/28/18 0544 01/28/18 1020      Microbiology: Results for orders placed or performed during the hospital encounter of 01/25/2018  MRSA PCR  Screening     Status: None   Collection Time: 01/28/18  5:00 AM  Result Value Ref Range Status   MRSA by PCR NEGATIVE NEGATIVE Final    Comment:        The GeneXpert MRSA Assay (FDA approved for NASAL specimens only), is one component of a comprehensive MRSA colonization surveillance program. It is not intended to diagnose MRSA infection nor to guide or monitor treatment for MRSA infections. Performed at [facility], [address] [place], [place] [address]   Urine Culture     Status: None   Collection Time: 01/28/18  6:16 AM  Result Value Ref Range Status   Specimen Description   Final    URINE, RANDOM Performed at [facility] [address], [place], [place] [address]    Special Requests   Final    NONE Performed at [facility], [address] [place], [place] [address]    Culture   Final    NO GROWTH Performed at [facility], [place] [address] [place], [place] [address]    Report Status 01/29/2018 FINAL  Final  Culture, blood (Routine X 2) w Reflex to ID Panel     Status: None (Preliminary result)   Collection Time: 01/28/18  6:45 AM  Result Value Ref Range Status   Specimen Description BLOOD LAC  Final   Special Requests   Final    BOTTLES DRAWN AEROBIC AND ANAEROBIC Blood Culture adequate volume   Culture   Final    NO GROWTH 4 DAYS Performed at [facility], [address], [place], [place] [address]    Report Status PENDING  Incomplete  Culture, blood (Routine X 2) w Reflex to ID Panel     Status: None (Preliminary result)   Collection Time: 01/28/18  6:56 AM  Result Value Ref Range Status   Specimen Description BLOOD RAC  Final   Special Requests   Final    BOTTLES DRAWN AEROBIC AND ANAEROBIC Blood Culture results may not be optimal due to an inadequate volume of blood received in culture bottles   Culture   Final    NO GROWTH 4 DAYS Performed at [facility], [address] [place], [place]  [address]    Report Status PENDING  Incomplete  Culture, respiratory (non-expectorated)     Status: None   Collection Time: 01/28/18 11:24 AM  Result Value Ref Range Status   Specimen Description   Final    TRACHEAL ASPIRATE Performed at [facility], [address] [place], [place] [address]    Special Requests   Final    NONE Performed at [facility], [address] [place], [place] [address]    Gram Stain   Final    RARE WBC PRESENT,BOTH PMN AND MONONUCLEAR RARE GRAM POSITIVE COCCI IN PAIRS    Culture   Final    RARE Consistent with normal respiratory flora. Performed at [facility], [place] [address], [place], [place] [address]  Report Status 01/30/2018 FINAL  Final   Events:   Studies: Dg Abd 1 View  Result Date: 01/28/2018 CLINICAL DATA:  NG tube placement EXAM: ABDOMEN - 1 VIEW COMPARISON:  01/20/2018 FINDINGS: No enteric tube is demonstrated within the field of view. Mild gaseous distention of the stomach. IMPRESSION: No enteric tube visualized. Electronically Signed   By: Lucienne Capers M.D.   On: 01/28/2018 05:53   Dg Abd 1 View  Result Date: 01/20/2018 CLINICAL DATA:  Encounter for NG tube placement EXAM: ABDOMEN - 1 VIEW COMPARISON:  01/20/2018 FINDINGS: Malpositioned nasogastric tube, coiled backwards overlying the mid esophagus with tip excluded from view Endotracheal tube overlies the tracheal air column with tip superior to the carina. No pneumothorax. Pacer leads overlie the left ventricular apex. Pulmonary vasculature remains indistinct with cephalization of flow. Suspected trace right-sided pleural effusion. No definite left-sided pleural effusion, though the left costophrenic angle is excluded from view. Nonobstructive bowel gas pattern. No pneumoperitoneum, pneumatosis or portal venous gas. IMPRESSION: 1. Malpositioned nasogastric tube.  Repositioning is advised. 2. Similar findings of pulmonary edema and trace right-sided pleural  effusion. 3. Nonobstructive bowel gas pattern. Electronically Signed   By: Sandi Mariscal M.D.   On: 01/20/2018 14:03   Dg Abd 1 View  Result Date: 01/20/2018 CLINICAL DATA:  NG tube placement EXAM: ABDOMEN - 1 VIEW COMPARISON:  None. FINDINGS: There is no nasogastric tube identified in the lower chest or abdomen. There is mild gaseous distension of the colon and stomach. There is no evidence of pneumoperitoneum, portal venous gas or pneumatosis. There are no pathologic calcifications along the expected course of the ureters. The osseous structures are unremarkable. IMPRESSION: No nasogastric tube identified in the lower chest or abdomen. Electronically Signed   By: Kathreen Devoid   On: 01/20/2018 12:42   Dg Abd 1 View  Result Date: 01/20/2018 CLINICAL DATA:  Abdominal pain. EXAM: ABDOMEN - 1 VIEW COMPARISON:  None. FINDINGS: A right-sided pleural effusion is suspected layering posteriorly. Pacer paddles are noted on the chest. The bowel gas pattern is unremarkable. Air and stool scattered throughout the colon. A few scattered air-filled small bowel loops. No obvious free air. A right femoral line is noted. Bony structures are unremarkable. IMPRESSION: Suspect right pleural effusion. No plain film findings for an acute abdominal process. Electronically Signed   By: Marijo Sanes M.D.   On: 01/20/2018 11:22   Ct Head Wo Contrast  Result Date: 01/30/2018 CLINICAL DATA:  Anoxic brain injury EXAM: CT HEAD WITHOUT CONTRAST TECHNIQUE: Contiguous axial images were obtained from the base of the skull through the vertex without intravenous contrast. COMPARISON:  Head CT 01/28/2018 FINDINGS: Brain: There is diffuse blurring of the gray-white interface, slightly greater than on the prior examination. There is crowding of the basal cisterns. No acute hemorrhage or other extra-axial collection. Vascular: No abnormal hyperdensity of the major intracranial arteries or dural venous sinuses. No intracranial atherosclerosis.  Skull: The visualized skull base, calvarium and extracranial soft tissues are normal. Sinuses/Orbits: No fluid levels or advanced mucosal thickening of the visualized paranasal sinuses. No mastoid or middle ear effusion. The orbits are normal. IMPRESSION: Diffuse blurring of the gray-white interface, compatible with global edema, slightly progressed compared to 01/28/2018. Mild worsening of basal cisternal crowding. Electronically Signed   By: Ulyses Jarred M.D.   On: 01/30/2018 13:17   Ct Head Wo Contrast  Result Date: 01/28/2018 CLINICAL DATA:  Cardiac arrest. History of hypertension and diabetes. EXAM: CT HEAD WITHOUT CONTRAST TECHNIQUE:  Contiguous axial images were obtained from the base of the skull through the vertex without intravenous contrast. COMPARISON:  CT neck January 20, 2018. FINDINGS: BRAIN: No intraparenchymal hemorrhage, mass effect nor midline shift. Mild blurring of the gray-white matter differentiation. Patchy supratentorial white matter hypodensities. The ventricles and sulci are normal. No acute large vascular territory infarcts. No abnormal extra-axial fluid collections. Basal cisterns are patent. VASCULAR: Mild calcific atherosclerosis carotid bifurcations. SKULL/SOFT TISSUES: No skull fracture. No significant soft tissue swelling. ORBITS/SINUSES: The included ocular globes and orbital contents are normal.Mild paranasal sinus mucosal thickening without air-fluid levels. Included mastoid air cells are well aerated. OTHER: Life-support lines in place. IMPRESSION: 1. 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 2. Mild chronic small vessel ischemic changes. 3. Mild atherosclerosis. 4. Acute findings discussed with and reconfirmed by Dr.ALLISON WEBSTER on 01/28/2018 at 1:00 am. Electronically Signed   By: Elon Alas M.D.   On: 01/28/2018 01:04   Ct Soft Tissue Neck Wo Contrast  Result Date: 01/20/2018 CLINICAL DATA:  57 y/o  M; thyroid nodule. EXAM: CT NECK  WITHOUT CONTRAST TECHNIQUE: Multidetector CT imaging of the neck was performed following the standard protocol without intravenous contrast. COMPARISON:  None. FINDINGS: Pharynx and larynx: Debris within the airways from intubation. Salivary glands: No inflammation, mass, or stone. Thyroid: 5.6 cm nodule within the left lobe of the thyroid. 18 mm nodule in the right lobe of thyroid. Lymph nodes: None enlarged or abnormal density. Vascular: Negative. Limited intracranial: Negative. Visualized orbits: Negative. Mastoids and visualized paranasal sinuses: Mild mucosal thickening of the left maxillary sinus. Normal aeration of the mastoid air cells. Skeleton: No acute or aggressive process. Dental disease with multiple periapical cysts and absent crowns. Mild cervical spondylosis greatest at the C5-6 level. Upper chest: Endotracheal tube tip 3.4 cm above the carina. Moderate bilateral pleural effusions and dependent atelectasis of the lungs. Other: None. IMPRESSION: 1. Thyroid nodules measuring up to 5.6 cm in the left lobe of thyroid. Mass effect from the large left lobe of thyroid nodule displaces the airway rightward. 2. Endotracheal tube tip 3.4 cm above the carina. 3. Moderate bilateral pleural effusion with dependent atelectasis of the lungs. Electronically Signed   By: Kristine Garbe M.D.   On: 01/20/2018 04:44   Dg Chest Port 1 View  Result Date: 01/31/2018 CLINICAL DATA:  Check endotracheal tube EXAM: PORTABLE CHEST 1 VIEW COMPARISON:  01/30/2018 FINDINGS: Endotracheal tube is noted at the thoracic inlet. Bilateral jugular catheters are again noted and stable. Cardiac shadow is stable but enlarged. Persistent right-sided infiltrate and effusion is seen. The left lung is clear. IMPRESSION: Right-sided effusion and infiltrate stable from the prior exam. Tubes and lines as described. Electronically Signed   By: Inez Catalina M.D.   On: 01/31/2018 10:49   Dg Chest Port 1 View  Result Date:  01/30/2018 CLINICAL DATA:  Central line placement EXAM: PORTABLE CHEST 1 VIEW COMPARISON:  01/30/2018 at 0918 hours FINDINGS: Endotracheal tube terminates 6 cm above the carina. Right IJ dual lumen catheter terminates in the lower SVC. Left IJ venous catheter terminates in the left brachiocephalic vein. Cardiomegaly with mild to moderate interstitial edema. Multifocal pneumonia is also possible. Suspected bilateral pleural effusions, right greater than left. No pneumothorax. IMPRESSION: Endotracheal tube terminates 6 cm above the carina. Right IJ catheter terminates in the lower SVC. Left IJ catheter terminates in the left brachiocephalic vein. Cardiomegaly with suspected mild to moderate interstitial edema and bilateral pleural effusions. Multifocal pneumonia is also possible. Electronically Signed  By: Julian Hy M.D.   On: 01/30/2018 21:42   Dg Chest Port 1 View  Result Date: 01/30/2018 CLINICAL DATA:  57 y.o. male.with a history of type 2 diabetes hypertension and sleep apnea who was brought to the ED by his spouse in cardiac and respiratory arrest. now on vent, smoker EXAM: PORTABLE CHEST 1 VIEW COMPARISON:  Chest x-rays dated 01/28/2018 and 01/24/2018. FINDINGS: Study is hypoinspiratory. There is continued bilateral pulmonary edema pattern, not significantly changed compared to the most recent study of 01/28/2018, increased compared to the earlier study of 01/24/2018. Given the low lung volumes, heart size and mediastinal contours appear stable. Endotracheal tube tip is at the level of the clavicles, approximately 6 cm above the carina. LEFT jugular vein central line is stable in position with tip at the level of the upper SVC. No pneumothorax seen. Probable small bilateral pleural effusions. IMPRESSION: 1. Continued bilateral pulmonary edema pattern, not significantly changed compared to most recent chest x-ray of 01/28/2018. 2. Probable small bilateral pleural effusions. 3. Cardiomegaly. 4.  Support apparatus appears stable in position. Endotracheal tube tip is at the level of the clavicles, approximately 6 cm above the carina. Electronically Signed   By: Franki Cabot M.D.   On: 01/30/2018 09:37   Dg Chest Port 1 View  Result Date: 01/28/2018 CLINICAL DATA:  Line placement EXAM: PORTABLE CHEST 1 VIEW COMPARISON:  01/28/2018 FINDINGS: Endotracheal tube measures 6.8 cm above the carina. A left central venous catheter has been placed with tip over the confluence of the brachiocephalic vein and IVC. Shallow inspiration. Cardiac enlargement. Bilateral perihilar infiltrates. Probable small right pleural effusion. No pneumothorax is visible. IMPRESSION: Appliances appear in satisfactory position. Cardiac enlargement with bilateral perihilar infiltrates. Small right pleural effusion. No pneumothorax. Electronically Signed   By: Lucienne Capers M.D.   On: 01/28/2018 05:52   Dg Chest Portable 1 View  Result Date: 01/28/2018 CLINICAL DATA:  Short of breath EXAM: PORTABLE CHEST 1 VIEW COMPARISON:  01/24/2018, 01/21/2018 FINDINGS: Interval intubation, tip of the endotracheal tube is about 4 cm superior to the carina. Worsening bilateral interstitial and alveolar disease. No large effusion. Stable mild cardiomegaly. No pneumothorax. IMPRESSION: 1. Endotracheal tube tip about 3.5 cm superior to the carina. 2. Cardiomegaly. Interim worsening of bilateral interstitial and alveolar airspace disease. Electronically Signed   By: Donavan Foil M.D.   On: 01/28/2018 00:29   Dg Chest Port 1 View  Result Date: 01/24/2018 CLINICAL DATA:  Respiratory failure EXAM: PORTABLE CHEST 1 VIEW COMPARISON:  01/21/2018, 01/20/2018, 12/13/2017 FINDINGS: Removal of the endotracheal tube. Slight increased upper lobe ground-glass opacity. Improved aeration at the bases. Stable slightly enlarged cardiomediastinal silhouette. No pneumothorax. IMPRESSION: 1. Removal of endotracheal tube 2. Improved aeration at the bilateral lung  bases. Slight interval increase in bilateral upper lobe ground-glass opacity. 3. Stable mild cardiomegaly Electronically Signed   By: Donavan Foil M.D.   On: 01/24/2018 03:58   Dg Chest Port 1 View  Result Date: 01/21/2018 CLINICAL DATA:  Acute respiratory failure. EXAM: PORTABLE CHEST 1 VIEW COMPARISON:  01/20/2018 FINDINGS: The endotracheal tube is in good position at the mid tracheal level. The lungs demonstrate improved aeration with resolving edema and atelectasis. Probable persistent small layering right pleural effusion. IMPRESSION: Stable position of the endotracheal tube. Improved lung aeration with resolving edema and atelectasis. Electronically Signed   By: Marijo Sanes M.D.   On: 01/21/2018 08:36   Dg Chest Portable 1 View  Result Date: 01/20/2018 CLINICAL DATA:  57  y/o  M; status post intubation. EXAM: PORTABLE CHEST 1 VIEW COMPARISON:  01/20/2018 chest radiograph FINDINGS: Stable cardiomegaly given projection and technique. Interstitial and alveolar pulmonary edema. Probable small effusions. Endotracheal tube tip 4.5 cm above the carina. Transcutaneous pacing pads noted. Bones are unremarkable. IMPRESSION: 1. Endotracheal tube tip 4.5 cm above the carina. 2. Stable cardiomegaly and pulmonary edema. Probable small effusions. Electronically Signed   By: Kristine Garbe M.D.   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 Stable cardiomegaly. Nonaneurysmal thoracic aorta. Diffuse bilateral airspace disease and vascular congestion consistent with pulmonary edema. No significant effusion or pneumothorax. No acute osseous abnormality. IMPRESSION: Stable cardiomegaly with pulmonary edema. Superimposed pneumonia is not entirely excluded but believed less likely. Electronically Signed   By: Ashley Royalty  M.D.   On: 01/20/2018 02:56   Dg Addison Bailey G Tube Plc W/fl W/rad  Result Date: 01/20/2018 CLINICAL DATA:  Patient presents for Dobbhoff tube placement. EXAM: NASO G TUBE PLACEMENT WITH FL AND WITH RAD FLUOROSCOPY TIME:  Fluoroscopy Time:  0.3 minute Radiation Exposure Index (if provided by the fluoroscopic device): 1.3 mGy Number of Acquired Spot Images: 0 COMPARISON:  None. FINDINGS: Multiple attempts at placing a Dobbhoff tube were made through the right and left nare. The tip of the Dobbhoff tube would not progressed beyond the level of the mid trachea on repeated attempts. Attempts were made at advancing the Dobbhoff tube following deflating the tracheostomy cuff, but the Dobbhoff tube could not advance any further. Examination was terminated at this time. IMPRESSION: 1. Multiple unsuccessful attempts at placing a Dobbhoff tube which would not progressed beyond the level of the mid trachea Electronically Signed   By: Kathreen Devoid   On: 01/20/2018 16:41    Consults: Treatment Team:  Teodoro Spray, MD Leotis Pain, MD Anthonette Legato, MD   Subjective:    Overnight Issues: patient has been completely rewarmed presently on mechanical ventilation, CRRT, status post CT scan, please see radiology report pending EEG  Objective:  Vital signs for last 24 hours: Temp:  [95.5 F (35.3 C)-99 F (37.2 C)] 95.5 F (35.3 C) (07/31 0800) Pulse Rate:  [83-115] 83 (07/31 0800) Resp:  [25-41] 25 (07/31 0800) BP: (139-180)/(83-108) 141/83 (07/31 0800) SpO2:  [95 %-100 %] 100 % (07/31 0800) Arterial Line BP: (153-187)/(78-84) 154/79 (07/30 1100) FiO2 (%):  [40 %-60 %] 40 % (07/31 0313) Weight:  [274 lb 14.6 oz (124.7 kg)] 274 lb 14.6 oz (124.7 kg) (07/31 0449)  Hemodynamic parameters for last 24 hours:    Intake/Output from previous day: 07/30 0701 - 07/31 0700 In: 886.5 [I.V.:336.5; IV Piggyback:550] Out: 165 [Urine:165]  Intake/Output this shift: No intake/output data recorded.  Vent  settings for last 24 hours: Vent Mode: PRVC FiO2 (%):  [40 %-60 %] 40 % Set Rate:  [25 bmp] 25 bmp Vt Set:  [500 mL] 500 mL PEEP:  [10 cmH20] 10 cmH20 Plateau Pressure:  [26 [id number] cmH20] 26 cmH20  Physical Exam:  Vital signs: . Please see the above listed vital signs HEENT: Patient is orally intubated, trachea is midline, no accessory muscle utilization Cardiovascular: Regular rate and rhythm Pulmonary: Coarse rhonchi Abdominal: Hypoactive bowel sounds, soft exam, or tics on in place Extremities: No clubbing cyanosis or edema noted Neurologic: Patient with no pupillary response, no corneals noted, no  gag appreciated and unresponsive. Only myoclonic jerking noted  Assessment/Plan:   Status post cardiac arrest.  Status post targeted temperature management. No significant improvement in neurologic status, anoxic brain injury Appreciate neurology. Long discussion with family, they wish to proceed with tracheostomy, PEG tube placement.  History of severe cardiomyopathy with systolic failure. Measured ejection fraction of 15% on echocardiogram yesterday  Renal failure. Presently on CRRT greatly appreciate nephrology's assistance, BUN 40 and Cr 2.87,  Leukocytosis. Patient on cefepime and vancomycin, so far cultures are negative  Anemia. No evidence of active bleeding  We'll consult ENT for tracheostomy and GI for PEG tube placement  Critical care time 40 minutes   Clydine Parkison 02/01/2018  *Care during the described time interval was provided by me and/or other providers on the critical care team.  I have reviewed this patient's available data, including medical history, events of note, physical examination and test results as part of my evaluation. Patient ID: THOS MATSUMOTO, male   DOB: 05-17-1961, 57 y.o.   MRN: [id number]  Patient ID: CEDRIK HEINDL, male   DOB: July 26, 1960, 57 y.o.   MRN: [id number]

## 2018-02-01 NOTE — Consult Note (Signed)
[facility] Inpatient Consult Note   Kathline Magic, M.D.  Reason for Consult: Feeding problem   Attending Requesting Consult: Hermelinda Dellen, M.D.   History of Present Illness: [name] is a 57 y.o. male with a history of congestive heart failure, hypertension admitted over a week ago with flash pulmonary edema.  The patient was discharged but readmitted on 01/28/2018 for apparent cardiac arrest.  He was coded for over 17 minutes in the emergency room before return of spontaneous circulation was achieved.  The patient, since that time, has not done well from a functional status according to neurology and intensivist impressions.  Patient continues to have oliguric renal failure, respiratory failure, heart failure and possible sepsis syndrome which have portended a poor prognosis.  Patient has not eaten any food in almost 1 week and does not currently have adequate enteral access.  Numerous attempts at nursing, physician and interventional radiology attempted insertions of nasogastric, Dobbhoff and orogastric tubes have failed. GI service has been called despite poor overall prognosis to insert a percutaneous endoscopic gastrostomy tube given poor nutritional status.  Tentative plans have been made for  tracheostomy placement tomorrow by the ear nose and throat surgeon.  It appears the family wishes to be most aggressive in rehabilitation and it is deemed that it PEG tube will need to be placed prior to transfer to a rehab facility, though this does not appear to be   imminent given persistent critical care issues.    Past Medical History:  Past Medical History:  Diagnosis Date  . Chest pain 2019  . Diabetes mellitus without complication ([place])   . Hypertension   . Sleep apnea     Problem List: Patient Active Problem List   Diagnosis Date Noted  . Acute renal failure ([place])   . Cardiopulmonary arrest with successful resuscitation ([place]) 01/28/2018  . Acute on chronic  combined systolic and diastolic CHF (congestive heart failure) ([place]) 01/28/2018  . Acute pulmonary edema ([place]) 01/28/2018  . Accelerated hypertension 01/28/2018  . Diabetes ([place]) 01/28/2018  . Encounter for central line placement   . Acute hypoxemic respiratory failure ([place]) 01/20/2018  . Lipoma of back 11/08/2017    Past Surgical History: Past Surgical History:  Procedure Laterality Date  . COLONOSCOPY  2017  . LIPOMA EXCISION     Left forehead    Allergies: Allergies  Allergen Reactions  . Enalapril Maleate Anaphylaxis  . Iodinated Diagnostic Agents Itching  . Isosorbide Mononitrate Er [Isosorbide Dinitrate] Other (See Comments)    "made me not be able to think or function"  . Canagliflozin Other (See Comments)    Home Medications: Medications Prior to Admission  Medication Sig Dispense Refill Last Dose  . ALPRAZolam (XANAX) 0.5 MG tablet Take 0.5 mg by mouth 2 (two) times daily as needed for anxiety.  0   . aspirin EC 81 MG tablet Take 81 mg by mouth daily.   01/19/2018 at 1400  . furosemide (LASIX) 20 MG tablet Take 1 tablet (20 mg total) by mouth daily. 30 tablet 0   . hydrALAZINE (APRESOLINE) 25 MG tablet Take 1 tablet (25 mg total) by mouth every 8 (eight) hours. 90 tablet 0   . metoprolol tartrate (LOPRESSOR) 50 MG tablet Take 1 tablet (50 mg total) by mouth 2 (two) times daily. 60 tablet 0   . [EXPIRED] moxifloxacin (AVELOX) 400 MG tablet Take 1 tablet (400 mg total) by mouth daily for 7 days. 4 tablet 0   . HYDROcodone-acetaminophen (  NORCO/VICODIN) 5-325 MG tablet Take 1 tablet by mouth every 4 (four) hours as needed. (Patient not taking: Reported on 01/20/2018) 10 tablet 0 Not Taking at Unknown time  . polyethylene glycol powder (GLYCOLAX/MIRALAX) powder Take 17 g by mouth daily as needed for moderate constipation. (Patient not taking: Reported on 01/20/2018) 255 g 0 Not Taking at Unknown time   Home medication reconciliation was completed with the patient.   Scheduled  Inpatient Medications:   . chlorhexidine gluconate (MEDLINE KIT)  15 mL Mouth Rinse BID  . heparin injection (subcutaneous)  5,000 Units Subcutaneous Q8H  . insulin aspart  0-20 Units Subcutaneous Q4H  . insulin glargine  5 Units Subcutaneous QHS  . ipratropium-albuterol  3 mL Nebulization Q6H  . mouth rinse  15 mL Mouth Rinse 10 times per day  . metoprolol tartrate  5 mg Intravenous Q6H  . sodium chloride flush  10-40 mL Intracatheter Q12H    Continuous Inpatient Infusions:   . sodium chloride    . sodium chloride 10 mL/hr at 02/01/18 1600  . sodium chloride    . ceFEPime (MAXIPIME) IV Stopped (02/01/18 0556)  . dextrose 5 % and 0.45% NaCl Stopped (01/28/18 0553)  . famotidine (PEPCID) IV Stopped (01/31/18 2219)  . propofol (DIPRIVAN) infusion 20.045 mcg/kg/min (02/01/18 1600)  . pureflow 2,500 mL/hr at 02/01/18 1000  . vancomycin Stopped (01/31/18 2329)    PRN Inpatient Medications:  sodium chloride, acetaminophen **OR** acetaminophen, dextrose, fentaNYL, heparin, heparin, hydrALAZINE, sodium chloride flush  Family History: family history includes Diabetes in his father.   GI Family History: Negative  Social History:   reports that he has been smoking cigarettes.  He has a 9.60 pack-year smoking history. He has never used smokeless tobacco. He reports that he does not drink alcohol or use drugs. The patient denies ETOH, tobacco, or drug use.    Review of Systems: Review of Systems - Unable to obtain  Physical Examination: BP (!) 151/78   Pulse (!) 109   Temp (!) 97.3 F (36.3 C)   Resp (!) 31   Ht 6' 1"  (1.854 m)   Wt 124.7 kg (274 lb 14.6 oz)   SpO2 96%   BMI 36.27 kg/m  Physical Exam  Constitutional: He appears ill. He is sedated and intubated.  HENT:  Head: Normocephalic and atraumatic.  Eyes: Pupils are equal, round, and reactive to light.  Cardiovascular: Normal heart sounds and normal pulses.  Occasional extrasystoles are present. Tachycardia present.   Pulmonary/Chest: He is intubated. He has decreased breath sounds. He has rhonchi.  Abdominal: Soft. Bowel sounds are normal. There is no tenderness.  Neurological: He is unresponsive.    Data: Lab Results  Component Value Date   WBC 23.8 (H) 02/01/2018   HGB 8.9 (L) 02/01/2018   HCT 26.3 (L) 02/01/2018   MCV 91.6 02/01/2018   PLT 100 (L) 02/01/2018   Recent Labs  Lab 01/28/18 0432 01/29/18 0210 02/01/18 0446  HGB 11.9* 10.3* 8.9*   Lab Results  Component Value Date   NA 136 02/01/2018   K 4.5 02/01/2018   CL 102 02/01/2018   CO2 25 02/01/2018   BUN 41 (H) 02/01/2018   CREATININE 2.63 (H) 02/01/2018   Lab Results  Component Value Date   ALT 36 01/28/2018   AST 59 (H) 01/28/2018   ALKPHOS 54 01/28/2018   BILITOT 1.0 01/28/2018   Recent Labs  Lab 01/28/18 1145  02/01/18 0446  APTT 40*   < > 35  INR  1.36  --   --    < > = values in this interval not displayed.   CBC Latest Ref Rng & Units 02/01/2018 01/29/2018 01/28/2018  WBC 3.8 - 10.6 K/uL 23.8(H) 25.9(H) 33.8(H)  Hemoglobin 13.0 - 18.0 g/dL 8.9(L) 10.3(L) 11.9(L)  Hematocrit 40.0 - 52.0 % 26.3(L) 30.1(L) 34.5(L)  Platelets 150 - 440 K/uL 100(L) 113(L) 268    STUDIES: Dg Chest Port 1 View  Result Date: 02/01/2018 CLINICAL DATA:  Cardiac and respiratory arrest.  Post intubation. EXAM: PORTABLE CHEST 1 VIEW COMPARISON:  01/31/2018 FINDINGS: Endotracheal tube 6.9 cm above the carina. Advancement with 3-4 cm may be considered. Central lines in stable position. Enlarged globular heart.  Mediastinal contours appear intact. Bilateral pleural effusions and interstitial pulmonary edema. More focal airspace consolidation in the right lung base cannot be excluded. Osseous structures are without acute abnormality. Soft tissues are grossly normal. IMPRESSION: Endotracheal tube 6.9 cm above the carina. Advancement with 3-4 cm may be considered. Enlarged globular heart. This may be due to cardiomegaly or pericardial effusion.  Interstitial pulmonary edema with moderate in size bilateral pleural effusions. More focal airspace consolidation in the right lung base cannot be excluded. Electronically Signed   By: Fidela Salisbury M.D.   On: 02/01/2018 09:36   Dg Chest Port 1 View  Result Date: 01/31/2018 CLINICAL DATA:  Check endotracheal tube EXAM: PORTABLE CHEST 1 VIEW COMPARISON:  01/30/2018 FINDINGS: Endotracheal tube is noted at the thoracic inlet. Bilateral jugular catheters are again noted and stable. Cardiac shadow is stable but enlarged. Persistent right-sided infiltrate and effusion is seen. The left lung is clear. IMPRESSION: Right-sided effusion and infiltrate stable from the prior exam. Tubes and lines as described. Electronically Signed   By: Inez Catalina M.D.   On: 01/31/2018 10:49   Dg Chest Port 1 View  Result Date: 01/30/2018 CLINICAL DATA:  Central line placement EXAM: PORTABLE CHEST 1 VIEW COMPARISON:  01/30/2018 at 0918 hours FINDINGS: Endotracheal tube terminates 6 cm above the carina. Right IJ dual lumen catheter terminates in the lower SVC. Left IJ venous catheter terminates in the left brachiocephalic vein. Cardiomegaly with mild to moderate interstitial edema. Multifocal pneumonia is also possible. Suspected bilateral pleural effusions, right greater than left. No pneumothorax. IMPRESSION: Endotracheal tube terminates 6 cm above the carina. Right IJ catheter terminates in the lower SVC. Left IJ catheter terminates in the left brachiocephalic vein. Cardiomegaly with suspected mild to moderate interstitial edema and bilateral pleural effusions. Multifocal pneumonia is also possible. Electronically Signed   By: Julian Hy M.D.   On: 01/30/2018 21:42   @IMAGES @  Assessment: 1. Feeding problem -this is compounded by unsuccessful attempted insertion of nasogastric, Dobbhoff tube even by interventional radiology specialist.  There is a questionable history of a thyroid nodule which possibly is  complicating blind insertion of a feeding tube.  PEG tube placement at this time would be considered a significant risk despite the benefit of nutritional support.  It might be more preferred to place a nasojejunal tube endoscopically which is less risk to the patient. This can be changed to a PEG when it is determined at a later time the patient is stable.  2. Multiorgan failure - Poor overall prognosis.  3. CHF exacerbation with pulmonary edema.   4. Dilated cardiomyopathy.   Recommendations: 1. Await tracheostomy insertion. 2. Discuss with Dr Jonathon Bellows regarding his preferences as well.[facility] will be covering my inpatient service while I am out 8/1 through 8/4.  3. Further  recommendations to follow pending clinical status.    Thank you for the consult. Please call with questions or concerns.  Olean Ree, "Lanny Hurst MD [facility] [place], [place] [address] [phone]  02/01/2018 5:12 PM

## 2018-02-02 ENCOUNTER — Inpatient Hospital Stay: Payer: BLUE CROSS/BLUE SHIELD | Admitting: Anesthesiology

## 2018-02-02 ENCOUNTER — Encounter: Admission: EM | Disposition: E | Payer: Self-pay | Source: Home / Self Care | Attending: Specialist

## 2018-02-02 LAB — CBC WITH DIFFERENTIAL/PLATELET
BAND NEUTROPHILS: 2 %
BASOS ABS: 0 10*3/uL (ref 0–0.1)
Basophils Relative: 0 %
Blasts: 0 %
EOS ABS: 0 10*3/uL (ref 0–0.7)
EOS PCT: 0 %
HCT: 26.4 % — ABNORMAL LOW (ref 40.0–52.0)
Hemoglobin: 8.9 g/dL — ABNORMAL LOW (ref 13.0–18.0)
Lymphocytes Relative: 7 %
Lymphs Abs: 1.4 10*3/uL (ref 1.0–3.6)
MCH: 31.2 pg (ref 26.0–34.0)
MCHC: 33.9 g/dL (ref 32.0–36.0)
MCV: 91.9 fL (ref 80.0–100.0)
METAMYELOCYTES PCT: 1 %
MONOS PCT: 16 %
Monocytes Absolute: 3.1 10*3/uL — ABNORMAL HIGH (ref 0.2–1.0)
Myelocytes: 0 %
NEUTROS ABS: 14.9 10*3/uL — AB (ref 1.4–6.5)
Neutrophils Relative %: 74 %
Other: 0 %
Platelets: 111 10*3/uL — ABNORMAL LOW (ref 150–440)
Promyelocytes Relative: 0 %
RBC: 2.87 MIL/uL — ABNORMAL LOW (ref 4.40–5.90)
RDW: 13.5 % (ref 11.5–14.5)
WBC: 19.4 10*3/uL — ABNORMAL HIGH (ref 3.8–10.6)
nRBC: 2 /100 WBC — ABNORMAL HIGH

## 2018-02-02 LAB — RENAL FUNCTION PANEL
ALBUMIN: 2 g/dL — AB (ref 3.5–5.0)
Albumin: 2.1 g/dL — ABNORMAL LOW (ref 3.5–5.0)
Anion gap: 6 (ref 5–15)
Anion gap: 7 (ref 5–15)
BUN: 39 mg/dL — AB (ref 6–20)
BUN: 39 mg/dL — AB (ref 6–20)
CALCIUM: 8.2 mg/dL — AB (ref 8.9–10.3)
CHLORIDE: 104 mmol/L (ref 98–111)
CO2: 27 mmol/L (ref 22–32)
CO2: 26 mmol/L (ref 22–32)
CREATININE: 2.39 mg/dL — AB (ref 0.61–1.24)
Calcium: 8.4 mg/dL — ABNORMAL LOW (ref 8.9–10.3)
Chloride: 103 mmol/L (ref 98–111)
Creatinine, Ser: 2.56 mg/dL — ABNORMAL HIGH (ref 0.61–1.24)
GFR calc Af Amer: 33 mL/min — ABNORMAL LOW (ref >60)
GFR calc Af Amer: 30 mL/min — ABNORMAL LOW (ref >60)
GFR calc non Af Amer: 28 mL/min — ABNORMAL LOW (ref >60)
GFR, EST NON AFRICAN AMERICAN: 26 mL/min — AB (ref >60)
GLUCOSE: 140 mg/dL — AB (ref 70–99)
GLUCOSE: 105 mg/dL — AB (ref 70–99)
PHOSPHORUS: 4.4 mg/dL (ref 2.5–4.6)
POTASSIUM: 4.9 mmol/L (ref 3.5–5.1)
Phosphorus: 4.1 mg/dL (ref 2.5–4.6)
Potassium: 5 mmol/L (ref 3.5–5.1)
SODIUM: 136 mmol/L (ref 135–145)
Sodium: 137 mmol/L (ref 135–145)

## 2018-02-02 LAB — GLUCOSE, CAPILLARY
GLUCOSE-CAPILLARY: 113 mg/dL — AB (ref 70–99)
Glucose-Capillary: 103 mg/dL — ABNORMAL HIGH (ref 70–99)
Glucose-Capillary: 108 mg/dL — ABNORMAL HIGH (ref 70–99)
Glucose-Capillary: 111 mg/dL — ABNORMAL HIGH (ref 70–99)
Glucose-Capillary: 122 mg/dL — ABNORMAL HIGH (ref 70–99)
Glucose-Capillary: 126 mg/dL — ABNORMAL HIGH (ref 70–99)

## 2018-02-02 LAB — PHOSPHORUS: Phosphorus: 5.4 mg/dL — ABNORMAL HIGH (ref 2.5–4.6)

## 2018-02-02 LAB — APTT: aPTT: 34 seconds (ref 24–36)

## 2018-02-02 LAB — CULTURE, BLOOD (ROUTINE X 2)
Culture: NO GROWTH
Culture: NO GROWTH
Special Requests: ADEQUATE

## 2018-02-02 LAB — MAGNESIUM
MAGNESIUM: 2.3 mg/dL (ref 1.7–2.4)
Magnesium: 2.2 mg/dL (ref 1.7–2.4)

## 2018-02-02 SURGERY — CANCELLED PROCEDURE
Anesthesia: General

## 2018-02-02 MED ORDER — PROPOFOL 500 MG/50ML IV EMUL
INTRAVENOUS | Status: AC
  Filled 2018-02-02: qty 50

## 2018-02-02 MED ORDER — ROCURONIUM BROMIDE 50 MG/5ML IV SOLN
INTRAVENOUS | Status: AC
  Filled 2018-02-02: qty 1

## 2018-02-02 MED ORDER — ONDANSETRON HCL 4 MG/2ML IJ SOLN
INTRAMUSCULAR | Status: AC
  Filled 2018-02-02: qty 2

## 2018-02-02 MED ORDER — MIDAZOLAM HCL 2 MG/2ML IJ SOLN
INTRAMUSCULAR | Status: DC | PRN
  Administered 2018-02-02: 2 mg via INTRAVENOUS

## 2018-02-02 MED ORDER — PHENYLEPHRINE HCL 10 MG/ML IJ SOLN
INTRAMUSCULAR | Status: AC
  Filled 2018-02-02: qty 1

## 2018-02-02 MED ORDER — EPHEDRINE SULFATE 50 MG/ML IJ SOLN
INTRAMUSCULAR | Status: AC
  Filled 2018-02-02: qty 1

## 2018-02-02 MED ORDER — GLYCOPYRROLATE 0.2 MG/ML IJ SOLN
INTRAMUSCULAR | Status: AC
  Filled 2018-02-02: qty 1

## 2018-02-02 MED ORDER — LIDOCAINE HCL (PF) 2 % IJ SOLN
INTRAMUSCULAR | Status: AC
  Filled 2018-02-02: qty 10

## 2018-02-02 MED ORDER — PROPOFOL 10 MG/ML IV BOLUS
INTRAVENOUS | Status: AC
  Filled 2018-02-02: qty 40

## 2018-02-02 MED ORDER — MIDAZOLAM HCL 5 MG/5ML IJ SOLN
INTRAMUSCULAR | Status: AC
  Filled 2018-02-02: qty 5

## 2018-02-02 MED ORDER — FENTANYL CITRATE (PF) 100 MCG/2ML IJ SOLN
INTRAMUSCULAR | Status: AC
  Filled 2018-02-02: qty 2

## 2018-02-02 MED ORDER — DEXAMETHASONE SODIUM PHOSPHATE 10 MG/ML IJ SOLN
INTRAMUSCULAR | Status: AC
  Filled 2018-02-02: qty 1

## 2018-02-02 MED ORDER — SODIUM CHLORIDE 0.9 % IV SOLN
2.0000 g | Freq: Two times a day (BID) | INTRAVENOUS | Status: DC
  Administered 2018-02-02 – 2018-02-03 (×2): 2 g via INTRAVENOUS
  Filled 2018-02-02 (×3): qty 2

## 2018-02-02 MED ORDER — ROCURONIUM BROMIDE 100 MG/10ML IV SOLN
INTRAVENOUS | Status: DC | PRN
  Administered 2018-02-02: 50 mg via INTRAVENOUS

## 2018-02-02 MED ORDER — SUCCINYLCHOLINE CHLORIDE 20 MG/ML IJ SOLN
INTRAMUSCULAR | Status: AC
  Filled 2018-02-02: qty 1

## 2018-02-02 SURGICAL SUPPLY — 43 items
BLADE SURG 15 STRL LF DISP TIS (BLADE) IMPLANT
BLADE SURG 15 STRL SS (BLADE)
BLADE SURG SZ11 CARB STEEL (BLADE) IMPLANT
CANISTER SUCT 1200ML W/VALVE (MISCELLANEOUS) IMPLANT
CLOSURE WOUND 1/4X4 (GAUZE/BANDAGES/DRESSINGS)
CORD BIP STRL DISP 12FT (MISCELLANEOUS) IMPLANT
DERMABOND ADVANCED (GAUZE/BANDAGES/DRESSINGS)
DERMABOND ADVANCED .7 DNX12 (GAUZE/BANDAGES/DRESSINGS) IMPLANT
DRSG TEGADERM 2-3/8X2-3/4 SM (GAUZE/BANDAGES/DRESSINGS) IMPLANT
ELECT CAUTERY BLADE TIP 2.5 (TIP)
ELECT NEEDLE 20X.3 GREEN (MISCELLANEOUS)
ELECT REM PT RETURN 9FT ADLT (ELECTROSURGICAL)
ELECTRODE CAUTERY BLDE TIP 2.5 (TIP) IMPLANT
ELECTRODE NEEDLE 20X.3 GREEN (MISCELLANEOUS) IMPLANT
ELECTRODE REM PT RTRN 9FT ADLT (ELECTROSURGICAL) IMPLANT
FORCEPS JEWEL BIP 4-3/4 STR (INSTRUMENTS) IMPLANT
GAUZE PACKING IODOFORM 1/2 (PACKING) IMPLANT
GLOVE BIO SURGEON STRL SZ7.5 (GLOVE) IMPLANT
GOWN STRL REUS W/ TWL LRG LVL3 (GOWN DISPOSABLE) IMPLANT
GOWN STRL REUS W/TWL LRG LVL3 (GOWN DISPOSABLE)
HEMOSTAT SURGICEL 2X3 (HEMOSTASIS) IMPLANT
HLDR TRACH TUBE NECKBAND 18 (MISCELLANEOUS) IMPLANT
HOLDER TRACH TUBE NECKBAND 18 (MISCELLANEOUS)
KIT TURNOVER KIT A (KITS) IMPLANT
LABEL OR SOLS (LABEL) IMPLANT
NS IRRIG 500ML POUR BTL (IV SOLUTION) IMPLANT
PACK HEAD/NECK (MISCELLANEOUS) IMPLANT
PROBE MONO 100X0.75 ELECT 1.9M (MISCELLANEOUS) IMPLANT
SHEARS HARMONIC 9CM CVD (BLADE) IMPLANT
SPONGE DRAIN TRACH 4X4 STRL 2S (GAUZE/BANDAGES/DRESSINGS) IMPLANT
SPONGE KITTNER 5P (MISCELLANEOUS) IMPLANT
STRIP CLOSURE SKIN 1/4X4 (GAUZE/BANDAGES/DRESSINGS) IMPLANT
SUCTION FRAZIER HANDLE 10FR (MISCELLANEOUS)
SUCTION TUBE FRAZIER 10FR DISP (MISCELLANEOUS) IMPLANT
SUT SILK 2 0 (SUTURE)
SUT SILK 2 0 SH (SUTURE) IMPLANT
SUT SILK 2-0 18XBRD TIE 12 (SUTURE) IMPLANT
SUT VIC AB 3-0 PS2 18 (SUTURE) IMPLANT
SUT VIC AB 4-0 RB1 18 (SUTURE) IMPLANT
SYR 3ML LL SCALE MARK (SYRINGE) IMPLANT
SYSTEM CHEST DRAIN TLS 7FR (DRAIN) IMPLANT
TUBE TRACH SHILEY  6 DIST  CUF (TUBING) IMPLANT
TUBE TRACH SHILEY 8 DIST CUF (TUBING) IMPLANT

## 2018-02-02 NOTE — Progress Notes (Signed)
   03/01/2018 1025  Clinical Encounter Type  Visited With Patient  Visit Type Follow-up  Spiritual Encounters  Spiritual Needs Prayer   Chaplain offered silent and energetic prayers for patient, family, and care team.

## 2018-02-02 NOTE — Anesthesia Postprocedure Evaluation (Signed)
Anesthesia Post Note  Patient: [name]  Procedure(s) Performed: CANCELLED PROCEDURE After patient was in OR (N/A )  Patient location during evaluation: SICU Anesthesia Type: General Level of consciousness: sedated Pain management: pain level controlled Vital Signs Assessment: post-procedure vital signs reviewed and stable Respiratory status: patient remains intubated per anesthesia plan Cardiovascular status: stable Postop Assessment: no apparent nausea or vomiting Anesthetic complications: no     Last Vitals:  Vitals:   02/08/2018 0954 02/08/2018 1000  BP:  (!) 159/95  Pulse:  (!) 109  Resp:  (!) 24  Temp:    SpO2: 94% 94%    Last Pain:  Vitals:   02/23/2018 0950  TempSrc: Oral  PainSc:                  Precious Haws Isaac Lacson

## 2018-02-02 NOTE — Progress Notes (Signed)
Attempted to place NG,OG tube - failed. Discussed with DR Verdell Carmine and Dr Jefferson Fuel- probably would be best if IR attempted a G tube as he has a large body habitus.  Dr Jonathon Bellows MD,MRCP [facility]) Gastroenterology/Hepatology Pager: [phone]

## 2018-02-02 NOTE — Transfer of Care (Signed)
Immediate Anesthesia Transfer of Care Note  Patient: [name]  Procedure(s) Performed: CANCELLED PROCEDURE After patient was in OR (N/A )  Patient Location: ICU  Anesthesia Type:General  Level of Consciousness: sedated and unresponsive  Airway & Oxygen Therapy: Patient remains intubated per anesthesia plan and Patient placed on Ventilator (see vital sign flow sheet for setting)  Post-op Assessment: Report given to RN and Post -op Vital signs reviewed and stable  Post vital signs: Reviewed and stable  Last Vitals:  Vitals Value Taken Time  BP 138/83 02/26/2018  9:50 AM  Temp 36.9 C 02/08/2018  9:50 AM  Pulse 109 02/06/2018  9:55 AM  Resp 28 03/03/2018  9:55 AM  SpO2 94 % 02/22/2018  9:55 AM  Vitals shown include unvalidated device data.  Last Pain:  Vitals:   03/03/2018 0950  TempSrc: Oral  PainSc:          Complications: No apparent anesthesia complications

## 2018-02-02 NOTE — Consult Note (Signed)
Patient taken to OR for tracheostomy tube placement.  Significant amount of purulent drainage suctioned from ETT prior to movement to OR bed.  Once patient was moved and positioned, patient began to desaturate. Patient decreased to 30% Fi02 and continued to desaturate whenever he would dive below ET 40.  At this point due to secretions and high oxygen need, decision made to abort case and delay until improved lung function.  Patient has therefore tentatively been put on for surgery on 04-Mar-2018 for tracheostomy if lungs can be optimized.

## 2018-02-02 NOTE — Progress Notes (Addendum)
Follow up - Critical Care Medicine Note  Patient Details:    [name] is an 57 y.o. male.with a history of type 2 diabetes hypertension and sleep apnea who was brought to the ED by his spouse in cardiac and respiratory arrest.    Lines, Airways, Drains: Airway 7 mm (Active)  Secured at (cm) 24 cm 01/29/2018  8:04 AM  Measured From Lips 01/29/2018  8:04 AM  [place] 01/29/2018  8:04 AM  Secured By [employer] 01/29/2018  8:04 AM  Tube Holder Repositioned Yes 01/29/2018  3:38 AM  Cuff Pressure (cm H2O) 25 cm H2O 01/29/2018  8:04 AM  Site Condition Dry 01/29/2018  8:04 AM     CVC Triple Lumen 01/28/18 Left Internal jugular (Active)  Indication for Insertion or Continuance of Line Vasoactive infusions 01/29/2018  8:00 AM  Site Assessment Clean;Dry;Intact 01/29/2018  8:00 AM  Proximal Lumen Status Infusing;Flushed;Blood return noted 01/29/2018  8:00 AM  Medial Lumen Status Infusing;Flushed;Blood return noted 01/29/2018  8:00 AM  Distal Lumen Status Infusing;Flushed;Blood return noted 01/29/2018  8:00 AM  Dressing Type Transparent;Occlusive 01/29/2018  8:00 AM  Dressing Status Clean;Dry;Intact;Antimicrobial disc in place 01/29/2018  8:00 AM  Line Care Connections checked and tightened;Zeroed and calibrated;Leveled 01/29/2018  8:00 AM  Dressing Intervention New dressing 01/28/2018  4:00 AM  Dressing Change Due 02/04/18 01/29/2018  8:00 AM     Arterial Line 01/28/18 Right Radial (Active)  Site Assessment Clean;Dry;Intact 01/29/2018  8:00 AM  Line Status Pulsatile blood flow;Positional 01/29/2018  8:00 AM  Art Line Waveform Appropriate 01/29/2018  8:00 AM  Art Line Interventions Zeroed and calibrated 01/29/2018  8:00 AM  Color/Movement/Sensation Capillary refill less than 3 sec 01/29/2018  8:00 AM  Dressing Type Transparent;Occlusive 01/29/2018  8:00 AM  Dressing Status Clean;Intact;Dry;Antimicrobial disc in place 01/29/2018  8:00 AM  Interventions New dressing;Antimicrobial disc  changed 01/28/2018  4:00 AM  Dressing Change Due 02/04/18 01/29/2018  8:00 AM     Urethral Catheter THAHN, ed tech Straight-tip;Temperature probe 16 Fr. (Active)  Indication for Insertion or Continuance of Catheter Unstable critical patients (first 24-48 hours) 01/29/2018  8:00 AM  Site Assessment Clean;Intact;Dry 01/29/2018  8:00 AM  Catheter Maintenance Bag below level of bladder;Catheter secured;Drainage bag/tubing not touching floor 01/29/2018  8:00 AM  Collection Container Standard drainage bag 01/29/2018  8:00 AM  Securement Method Other (Comment) 01/29/2018  8:00 AM  Output (mL) 0 mL 01/29/2018  8:00 AM    Anti-infectives:  Anti-infectives (From admission, onward)   Start     Dose/Rate Route Frequency Ordered Stop   01/30/18 2200  vancomycin (VANCOCIN) IVPB 1000 mg/200 mL premix     1,000 mg 200 mL/hr over 60 Minutes Intravenous Every 24 hours 01/30/18 2043 02/06/18 2159   01/30/18 0500  vancomycin (VANCOCIN) 1,250 mg in sodium chloride 0.9 % 250 mL IVPB  Status:  Discontinued     1,250 mg 166.7 mL/hr over 90 Minutes Intravenous Every 18 hours 01/29/18 0956 01/30/18 0308   01/30/18 0307  vancomycin (VANCOCIN) 1,250 mg in sodium chloride 0.9 % 250 mL IVPB  Status:  Discontinued     1,250 mg 166.7 mL/hr over 90 Minutes Intravenous As needed 01/30/18 0308 02/01/18 1549   01/28/18 1400  vancomycin (VANCOCIN) 1,250 mg in sodium chloride 0.9 % 250 mL IVPB  Status:  Discontinued     1,250 mg 166.7 mL/hr over 90 Minutes Intravenous Every 18 hours 01/28/18 0544 01/29/18 0956   01/28/18 0600  ceFEPIme (MAXIPIME) 2  g in sodium chloride 0.9 % 100 mL IVPB     2 g 200 mL/hr over 30 Minutes Intravenous Every 12 hours 01/28/18 0544 02/04/18 0559   01/28/18 0545  vancomycin (VANCOCIN) 1,250 mg in sodium chloride 0.9 % 250 mL IVPB     1,250 mg 166.7 mL/hr over 90 Minutes Intravenous  Once 01/28/18 0544 01/28/18 1020      Microbiology: Results for orders placed or performed during the hospital  encounter of 01/09/2018  MRSA PCR Screening     Status: None   Collection Time: 01/28/18  5:00 AM  Result Value Ref Range Status   MRSA by PCR NEGATIVE NEGATIVE Final    Comment:        The GeneXpert MRSA Assay (FDA approved for NASAL specimens only), is one component of a comprehensive MRSA colonization surveillance program. It is not intended to diagnose MRSA infection nor to guide or monitor treatment for MRSA infections. Performed at [facility], [address] [place], [place] [address]   Urine Culture     Status: None   Collection Time: 01/28/18  6:16 AM  Result Value Ref Range Status   Specimen Description   Final    URINE, RANDOM Performed at [facility], [address], [place], [place] [address]    Special Requests   Final    NONE Performed at [facility], [address] [place], [place] [address]    Culture   Final    NO GROWTH Performed at [facility], [place] [address] [place], [place] [address]    Report Status 01/29/2018 FINAL  Final  Culture, blood (Routine X 2) w Reflex to ID Panel     Status: None   Collection Time: 01/28/18  6:45 AM  Result Value Ref Range Status   Specimen Description BLOOD LAC  Final   Special Requests   Final    BOTTLES DRAWN AEROBIC AND ANAEROBIC Blood Culture adequate volume   Culture   Final    NO GROWTH 5 DAYS Performed at [facility], [address] [place], [place] [address]    Report Status 02/08/2018 FINAL  Final  Culture, blood (Routine X 2) w Reflex to ID Panel     Status: None   Collection Time: 01/28/18  6:56 AM  Result Value Ref Range Status   Specimen Description BLOOD RAC  Final   Special Requests   Final    BOTTLES DRAWN AEROBIC AND ANAEROBIC Blood Culture results may not be optimal due to an inadequate volume of blood received in culture bottles   Culture   Final    NO GROWTH 5 DAYS Performed at [facility], [address], [place], [place]  [address]    Report Status 02/27/2018 FINAL  Final  Culture, respiratory (non-expectorated)     Status: None   Collection Time: 01/28/18 11:24 AM  Result Value Ref Range Status   Specimen Description   Final    TRACHEAL ASPIRATE Performed at [facility], [address] [place], [place] [address]    Special Requests   Final    NONE Performed at [facility], [address] [place], [place] [address]    Gram Stain   Final    RARE WBC PRESENT,BOTH PMN AND MONONUCLEAR RARE GRAM POSITIVE COCCI IN PAIRS    Culture   Final    RARE Consistent with normal respiratory flora. Performed at [facility], [place],  [place] [address]    Report Status 01/30/2018 FINAL  Final   Events:   Studies: Dg Abd 1 View  Result Date: 01/28/2018 CLINICAL DATA:  NG tube placement EXAM: ABDOMEN - 1 VIEW COMPARISON:  01/20/2018 FINDINGS: No enteric tube is demonstrated within the field of view. Mild gaseous distention of the stomach. IMPRESSION: No enteric tube visualized. Electronically Signed   By: Lucienne Capers M.D.   On: 01/28/2018 05:53   Dg Abd 1 View  Result Date: 01/20/2018 CLINICAL DATA:  Encounter for NG tube placement EXAM: ABDOMEN - 1 VIEW COMPARISON:  01/20/2018 FINDINGS: Malpositioned nasogastric tube, coiled backwards overlying the mid esophagus with tip excluded from view Endotracheal tube overlies the tracheal air column with tip superior to the carina. No pneumothorax. Pacer leads overlie the left ventricular apex. Pulmonary vasculature remains indistinct with cephalization of flow. Suspected trace right-sided pleural effusion. No definite left-sided pleural effusion, though the left costophrenic angle is excluded from view. Nonobstructive bowel gas pattern. No pneumoperitoneum, pneumatosis or portal venous gas. IMPRESSION: 1. Malpositioned nasogastric tube.  Repositioning is advised. 2. Similar findings of pulmonary edema and trace right-sided pleural  effusion. 3. Nonobstructive bowel gas pattern. Electronically Signed   By: Sandi Mariscal M.D.   On: 01/20/2018 14:03   Dg Abd 1 View  Result Date: 01/20/2018 CLINICAL DATA:  NG tube placement EXAM: ABDOMEN - 1 VIEW COMPARISON:  None. FINDINGS: There is no nasogastric tube identified in the lower chest or abdomen. There is mild gaseous distension of the colon and stomach. There is no evidence of pneumoperitoneum, portal venous gas or pneumatosis. There are no pathologic calcifications along the expected course of the ureters. The osseous structures are unremarkable. IMPRESSION: No nasogastric tube identified in the lower chest or abdomen. Electronically Signed   By: Kathreen Devoid   On: 01/20/2018 12:42   Dg Abd 1 View  Result Date: 01/20/2018 CLINICAL DATA:  Abdominal pain. EXAM: ABDOMEN - 1 VIEW COMPARISON:  None. FINDINGS: A right-sided pleural effusion is suspected layering posteriorly. Pacer paddles are noted on the chest. The bowel gas pattern is unremarkable. Air and stool scattered throughout the colon. A few scattered air-filled small bowel loops. No obvious free air. A right femoral line is noted. Bony structures are unremarkable. IMPRESSION: Suspect right pleural effusion. No plain film findings for an acute abdominal process. Electronically Signed   By: Marijo Sanes M.D.   On: 01/20/2018 11:22   Ct Head Wo Contrast  Result Date: 01/30/2018 CLINICAL DATA:  Anoxic brain injury EXAM: CT HEAD WITHOUT CONTRAST TECHNIQUE: Contiguous axial images were obtained from the base of the skull through the vertex without intravenous contrast. COMPARISON:  Head CT 01/28/2018 FINDINGS: Brain: There is diffuse blurring of the gray-white interface, slightly greater than on the prior examination. There is crowding of the basal cisterns. No acute hemorrhage or other extra-axial collection. Vascular: No abnormal hyperdensity of the major intracranial arteries or dural venous sinuses. No intracranial atherosclerosis.  Skull: The visualized skull base, calvarium and extracranial soft tissues are normal. Sinuses/Orbits: No fluid levels or advanced mucosal thickening of the visualized paranasal sinuses. No mastoid or middle ear effusion. The orbits are normal. IMPRESSION: Diffuse blurring of the gray-white interface, compatible with global edema, slightly progressed compared to 01/28/2018. Mild worsening of basal cisternal crowding. Electronically Signed   By: Ulyses Jarred M.D.   On: 01/30/2018 13:17   Ct Head Wo Contrast  Result Date: 01/28/2018 CLINICAL DATA:  Cardiac arrest. History of hypertension and diabetes. EXAM:  CT HEAD WITHOUT CONTRAST TECHNIQUE: Contiguous axial images were obtained from the base of the skull through the vertex without intravenous contrast. COMPARISON:  CT neck January 20, 2018. FINDINGS: BRAIN: No intraparenchymal hemorrhage, mass effect nor midline shift. Mild blurring of the gray-white matter differentiation. Patchy supratentorial white matter hypodensities. The ventricles and sulci are normal. No acute large vascular territory infarcts. No abnormal extra-axial fluid collections. Basal cisterns are patent. VASCULAR: Mild calcific atherosclerosis carotid bifurcations. SKULL/SOFT TISSUES: No skull fracture. No significant soft tissue swelling. ORBITS/SINUSES: The included ocular globes and orbital contents are normal.Mild paranasal sinus mucosal thickening without air-fluid levels. Included mastoid air cells are well aerated. OTHER: Life-support lines in place. IMPRESSION: 1. 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 2. Mild chronic small vessel ischemic changes. 3. Mild atherosclerosis. 4. Acute findings discussed with and reconfirmed by Dr.ALLISON WEBSTER on 01/28/2018 at 1:00 am. Electronically Signed   By: Elon Alas M.D.   On: 01/28/2018 01:04   Ct Soft Tissue Neck Wo Contrast  Result Date: 01/20/2018 CLINICAL DATA:  57 y/o  M; thyroid nodule. EXAM: CT NECK  WITHOUT CONTRAST TECHNIQUE: Multidetector CT imaging of the neck was performed following the standard protocol without intravenous contrast. COMPARISON:  None. FINDINGS: Pharynx and larynx: Debris within the airways from intubation. Salivary glands: No inflammation, mass, or stone. Thyroid: 5.6 cm nodule within the left lobe of the thyroid. 18 mm nodule in the right lobe of thyroid. Lymph nodes: None enlarged or abnormal density. Vascular: Negative. Limited intracranial: Negative. Visualized orbits: Negative. Mastoids and visualized paranasal sinuses: Mild mucosal thickening of the left maxillary sinus. Normal aeration of the mastoid air cells. Skeleton: No acute or aggressive process. Dental disease with multiple periapical cysts and absent crowns. Mild cervical spondylosis greatest at the C5-6 level. Upper chest: Endotracheal tube tip 3.4 cm above the carina. Moderate bilateral pleural effusions and dependent atelectasis of the lungs. Other: None. IMPRESSION: 1. Thyroid nodules measuring up to 5.6 cm in the left lobe of thyroid. Mass effect from the large left lobe of thyroid nodule displaces the airway rightward. 2. Endotracheal tube tip 3.4 cm above the carina. 3. Moderate bilateral pleural effusion with dependent atelectasis of the lungs. Electronically Signed   By: Kristine Garbe M.D.   On: 01/20/2018 04:44   Dg Chest Port 1 View  Result Date: 02/01/2018 CLINICAL DATA:  Cardiac and respiratory arrest.  Post intubation. EXAM: PORTABLE CHEST 1 VIEW COMPARISON:  01/31/2018 FINDINGS: Endotracheal tube 6.9 cm above the carina. Advancement with 3-4 cm may be considered. Central lines in stable position. Enlarged globular heart.  Mediastinal contours appear intact. Bilateral pleural effusions and interstitial pulmonary edema. More focal airspace consolidation in the right lung base cannot be excluded. Osseous structures are without acute abnormality. Soft tissues are grossly normal. IMPRESSION:  Endotracheal tube 6.9 cm above the carina. Advancement with 3-4 cm may be considered. Enlarged globular heart. This may be due to cardiomegaly or pericardial effusion. Interstitial pulmonary edema with moderate in size bilateral pleural effusions. More focal airspace consolidation in the right lung base cannot be excluded. Electronically Signed   By: Fidela [place] M.D.   On: 02/01/2018 09:36   Dg Chest Port 1 View  Result Date: 01/31/2018 CLINICAL DATA:  Check endotracheal tube EXAM: PORTABLE CHEST 1 VIEW COMPARISON:  01/30/2018 FINDINGS: Endotracheal tube is noted at the thoracic inlet. Bilateral jugular catheters are again noted and stable. Cardiac shadow is stable but enlarged. Persistent right-sided infiltrate and effusion is seen. The left lung  is clear. IMPRESSION: Right-sided effusion and infiltrate stable from the prior exam. Tubes and lines as described. Electronically Signed   By: Inez Catalina M.D.   On: 01/31/2018 10:49   Dg Chest Port 1 View  Result Date: 01/30/2018 CLINICAL DATA:  Central line placement EXAM: PORTABLE CHEST 1 VIEW COMPARISON:  01/30/2018 at 0918 hours FINDINGS: Endotracheal tube terminates 6 cm above the carina. Right IJ dual lumen catheter terminates in the lower SVC. Left IJ venous catheter terminates in the left brachiocephalic vein. Cardiomegaly with mild to moderate interstitial edema. Multifocal pneumonia is also possible. Suspected bilateral pleural effusions, right greater than left. No pneumothorax. IMPRESSION: Endotracheal tube terminates 6 cm above the carina. Right IJ catheter terminates in the lower SVC. Left IJ catheter terminates in the left brachiocephalic vein. Cardiomegaly with suspected mild to moderate interstitial edema and bilateral pleural effusions. Multifocal pneumonia is also possible. Electronically Signed   By: Julian Hy M.D.   On: 01/30/2018 21:42   Dg Chest Port 1 View  Result Date: 01/30/2018 CLINICAL DATA:  57 y.o. male.with a  history of type 2 diabetes hypertension and sleep apnea who was brought to the ED by his spouse in cardiac and respiratory arrest. now on vent, smoker EXAM: PORTABLE CHEST 1 VIEW COMPARISON:  Chest x-rays dated 01/28/2018 and 01/24/2018. FINDINGS: Study is hypoinspiratory. There is continued bilateral pulmonary edema pattern, not significantly changed compared to the most recent study of 01/28/2018, increased compared to the earlier study of 01/24/2018. Given the low lung volumes, heart size and mediastinal contours appear stable. Endotracheal tube tip is at the level of the clavicles, approximately 6 cm above the carina. LEFT jugular vein central line is stable in position with tip at the level of the upper SVC. No pneumothorax seen. Probable small bilateral pleural effusions. IMPRESSION: 1. Continued bilateral pulmonary edema pattern, not significantly changed compared to most recent chest x-ray of 01/28/2018. 2. Probable small bilateral pleural effusions. 3. Cardiomegaly. 4. Support apparatus appears stable in position. Endotracheal tube tip is at the level of the clavicles, approximately 6 cm above the carina. Electronically Signed   By: Franki Cabot M.D.   On: 01/30/2018 09:37   Dg Chest Port 1 View  Result Date: 01/28/2018 CLINICAL DATA:  Line placement EXAM: PORTABLE CHEST 1 VIEW COMPARISON:  01/28/2018 FINDINGS: Endotracheal tube measures 6.8 cm above the carina. A left central venous catheter has been placed with tip over the confluence of the brachiocephalic vein and IVC. Shallow inspiration. Cardiac enlargement. Bilateral perihilar infiltrates. Probable small right pleural effusion. No pneumothorax is visible. IMPRESSION: Appliances appear in satisfactory position. Cardiac enlargement with bilateral perihilar infiltrates. Small right pleural effusion. No pneumothorax. Electronically Signed   By: Lucienne Capers M.D.   On: 01/28/2018 05:52   Dg Chest Portable 1 View  Result Date:  01/28/2018 CLINICAL DATA:  Short of breath EXAM: PORTABLE CHEST 1 VIEW COMPARISON:  01/24/2018, 01/21/2018 FINDINGS: Interval intubation, tip of the endotracheal tube is about 4 cm superior to the carina. Worsening bilateral interstitial and alveolar disease. No large effusion. Stable mild cardiomegaly. No pneumothorax. IMPRESSION: 1. Endotracheal tube tip about 3.5 cm superior to the carina. 2. Cardiomegaly. Interim worsening of bilateral interstitial and alveolar airspace disease. Electronically Signed   By: Donavan Foil M.D.   On: 01/28/2018 00:29   Dg Chest Port 1 View  Result Date: 01/24/2018 CLINICAL DATA:  Respiratory failure EXAM: PORTABLE CHEST 1 VIEW COMPARISON:  01/21/2018, 01/20/2018, 12/13/2017 FINDINGS: Removal of the endotracheal tube. Slight  increased upper lobe ground-glass opacity. Improved aeration at the bases. Stable slightly enlarged cardiomediastinal silhouette. No pneumothorax. IMPRESSION: 1. Removal of endotracheal tube 2. Improved aeration at the bilateral lung bases. Slight interval increase in bilateral upper lobe ground-glass opacity. 3. Stable mild cardiomegaly Electronically Signed   By: Donavan Foil M.D.   On: 01/24/2018 03:58   Dg Chest Port 1 View  Result Date: 01/21/2018 CLINICAL DATA:  Acute respiratory failure. EXAM: PORTABLE CHEST 1 VIEW COMPARISON:  01/20/2018 FINDINGS: The endotracheal tube is in good position at the mid tracheal level. The lungs demonstrate improved aeration with resolving edema and atelectasis. Probable persistent small layering right pleural effusion. IMPRESSION: Stable position of the endotracheal tube. Improved lung aeration with resolving edema and atelectasis. Electronically Signed   By: Marijo Sanes M.D.   On: 01/21/2018 08:36   Dg Chest Portable 1 View  Result Date: 01/20/2018 CLINICAL DATA:  57 y/o  M; status post intubation. EXAM: PORTABLE CHEST 1 VIEW COMPARISON:  01/20/2018 chest radiograph FINDINGS: Stable cardiomegaly given  projection and technique. Interstitial and alveolar pulmonary edema. Probable small effusions. Endotracheal tube tip 4.5 cm above the carina. Transcutaneous pacing pads noted. Bones are unremarkable. IMPRESSION: 1. Endotracheal tube tip 4.5 cm above the carina. 2. Stable cardiomegaly and pulmonary edema. Probable small effusions. Electronically Signed   By: Kristine Garbe M.D.   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 Stable cardiomegaly. Nonaneurysmal thoracic aorta. Diffuse bilateral airspace disease and vascular congestion consistent with pulmonary edema. No significant effusion or pneumothorax. No acute osseous abnormality. IMPRESSION: Stable cardiomegaly with pulmonary edema. Superimposed pneumonia is not entirely excluded but believed less likely. Electronically Signed   By: Ashley Royalty M.D.   On: 01/20/2018 02:56   Dg Addison Bailey G Tube Plc W/fl W/rad  Result Date: 01/20/2018 CLINICAL DATA:  Patient presents for Dobbhoff tube placement. EXAM: NASO G TUBE PLACEMENT WITH FL AND WITH RAD FLUOROSCOPY TIME:  Fluoroscopy Time:  0.3 minute Radiation Exposure Index (if provided by the fluoroscopic device): 1.3 mGy Number of Acquired Spot Images: 0 COMPARISON:  None. FINDINGS: Multiple attempts at placing a Dobbhoff tube were made through the right and left nare. The tip of the Dobbhoff tube would not progressed beyond the level of the mid trachea on repeated attempts. Attempts were made at advancing the Dobbhoff tube following deflating the tracheostomy cuff, but the Dobbhoff tube could not advance any further. Examination was terminated at this time. IMPRESSION: 1. Multiple unsuccessful attempts at placing a Dobbhoff tube which would not progressed beyond the level of the mid trachea Electronically Signed   By:  Kathreen Devoid   On: 01/20/2018 16:41    Consults: Treatment Team:  Teodoro Spray, MD Leotis Pain, MD Anthonette Legato, MD Carloyn Manner, MD Efrain Sella, MD Jonathon Bellows, MD   Subjective:    Overnight Issues: patient has been completely rewarmed presently on mechanical ventilation, CRRT, status post CT scan, please see radiology report pending EEG  Objective:  Vital signs for last 24 hours: Temp:  [95.4 F (35.2 C)-99.1 F (37.3 C)] 99.1 F (37.3 C) (08/01 0700) Pulse Rate:  [83-109] 100 (08/01 0700) Resp:  [20-32] 20 (08/01 0700) BP: (135-174)/(76-98) 156/93 (08/01 0700) SpO2:  [94 %-100 %] 95 % (08/01 0700) FiO2 (%):  [30 %-40 %] 30 % (08/01  0700) Weight:  [266 lb 12.1 oz (121 kg)] 266 lb 12.1 oz (121 kg) (08/01 0436)  Hemodynamic parameters for last 24 hours:    Intake/Output from previous day: 07/31 0701 - 08/01 0700 In: 3305.9 [I.V.:621; IV Piggyback:2684.9] Out: 1164 [Urine:236]  Intake/Output this shift: No intake/output data recorded.  Vent settings for last 24 hours: Vent Mode: PRVC FiO2 (%):  [30 %-40 %] 30 % Set Rate:  [25 bmp] 25 bmp Vt Set:  [500 mL] 500 mL PEEP:  [5 cmH20-10 cmH20] 5 cmH20  Physical Exam:  Vital signs: . Please see the above listed vital signs HEENT: Patient is orally intubated, trachea is midline, no accessory muscle utilization Cardiovascular: Regular rate and rhythm Pulmonary: Coarse rhonchi Abdominal: Hypoactive bowel sounds, soft exam, or tics on in place Extremities: No clubbing cyanosis or edema noted Neurologic: Patient with no pupillary response, no corneals noted, no gag appreciated and unresponsive. Only myoclonic jerking noted  Assessment/Plan:   Status post cardiac arrest.  Status post targeted temperature management. No significant improvement in neurologic status, anoxic brain injury Appreciate neurology. Long discussion with family, they wish to proceed with tracheostomy, PEG tube placement. Pending  tracheostomy today  History of severe cardiomyopathy with systolic failure. Measured ejection fraction of 15% on echocardiogram yesterday  Renal failure. Presently on CRRT greatly appreciate nephrology's assistance  Leukocytosis. Patient on cefepime and vancomycin, so far cultures are negative. Will DC vancomycin and complete a seven-day course of cefepime  Anemia. No evidence of active bleeding   Critical care time 30 minutes   Aariv Medlock 02/07/2018  *Care during the described time interval was provided by me and/or other providers on the critical care team.  I have reviewed this patient's available data, including medical history, events of note, physical examination and test results as part of my evaluation. Patient ID: [name], male   DOB: December 09, 1960, 57 y.o.   MRN: [id number]  Patient ID: [name], male   DOB: 18-Sep-1960, 57 y.o.   MRN: [id number] Patient ID: [name], male   DOB: July 26, 1960, 57 y.o.   MRN: [id number]

## 2018-02-02 NOTE — Anesthesia Post-op Follow-up Note (Signed)
Anesthesia QCDR form completed.        

## 2018-02-02 NOTE — Progress Notes (Signed)
Returned to ICU from OR.  fio2 increased to 45% to increase o2 sats.

## 2018-02-02 NOTE — Progress Notes (Signed)
Post HD assessment, no change from baseline, pt stable tolerated tx, uf goal met.    02/27/2018 1550  Neurological  Level of Consciousness Responds to Pain  Orientation Level Intubated/Tracheostomy - Unable to assess  Respiratory  Respiratory Pattern Regular;Unlabored  Chest Assessment Chest expansion symmetrical  Bilateral Breath Sounds Coarse crackles  Cough None  Cardiac  Pulse Regular  Heart Sounds S1, S2  ECG Monitor Yes  Vascular  Edema Generalized  Generalized Edema +3  Psychosocial  Psychosocial (WDL) X  Patient Behaviors Not interactive

## 2018-02-02 NOTE — Progress Notes (Signed)
Patient moved to OR for trach.

## 2018-02-02 NOTE — Progress Notes (Signed)
Pharmacy Antibiotic Note  [name] is a 57 y.o. male admitted on 01/24/2018 s/p cardiac arrest and underwent targeted temperature management. Patient requiring mechanical ventilation and is being initiated on CRRT. Pharmacy has been consulted for cefepime dosing for sepsis. Patient with recent history requiring mechanical ventilation and discharged on 7/23 - patient received both vancomycin and cefepime during admission.   Plan: Continue cefepime 2g IV Q12hr through 8/2.   Height: 6\' 1"  (185.4 cm) Weight: 261 lb 11 oz (118.7 kg) IBW/kg (Calculated) : 79.9  Temp (24hrs), Avg:98.6 F (37 C), Min:97.3 F (36.3 C), Max:99.1 F (37.3 C)  Recent Labs  Lab 01/28/18 0034 01/28/18 0430 01/28/18 0432  01/29/18 0210  01/30/18 0355  01/30/18 1036  01/30/18 1616  02/01/18 0446 02/01/18 0812 02/01/18 1218 02/01/18 1645 02/01/18 2021 02/22/2018 0012 03/04/2018 0432  WBC 22.5*  --  33.8*  --  25.9*  --   --   --   --   --   --   --  23.8*  --   --   --   --   --  19.4*  CREATININE 1.47*  --   --    < > 2.39*   < > 4.05*   < >  --    < >  --    < > 2.87* 2.64* 2.63* 2.64*  --  2.39* 2.56*  LATICACIDVEN 8.7* 2.3*  --   --  3.5*  --  2.6*  --  1.8  --   --   --   --   --   --   --   --   --   --   VANCORANDOM  --   --   --   --   --    < >  --   --   --   --  23  --   --   --   --   --  16  --   --    < > = values in this interval not displayed.    Estimated Creatinine Clearance: 43 mL/min (A) (by C-G formula based on SCr of 2.56 mg/dL (H)).    Allergies  Allergen Reactions  . Enalapril Maleate Anaphylaxis  . Iodinated Diagnostic Agents Itching  . Isosorbide Mononitrate Er [Isosorbide Dinitrate] Other (See Comments)    "made me not be able to think or function"  . Canagliflozin Other (See Comments)    Antimicrobials this admission: Cefepime 7/19 >> 7/23, 7/27 >> 8/2 Vancomycin 7/19 >> 7/22, 7/27 >> 7/31  Dose adjustments this admission: 7/29 vancomycin held due to vancomycin level  of 32 7/29 CRRT initiated. Patients cefepime and vancomycin adjusted  Microbiology results: 7/27 BCx: no growth x 5 days  7/27 UCx: no growth  7/27 Sputum: normal flora   7/27 MRSA PCR: negative   Thank you for allowing pharmacy to be a part of this patient's care.  Corina Stacy L 02/21/2018 5:39 PM

## 2018-02-02 NOTE — Progress Notes (Signed)
Post HD TX    02/04/2018 1545  Vital Signs  Temp 98.6 F (37 C)  Pulse Rate (!) 107  Resp (!) 26  Oxygen Therapy  SpO2 97 %  End Tidal CO2 (EtCO2) 37  Post-Hemodialysis Assessment  Rinseback Volume (mL) 2009 mL  KECN 63.4 V  Dialyzer Clearance Clotted  Duration of HD Treatment -hour(s) 3.5 hour(s)  Hemodialysis Intake (mL) 500 mL  UF Total -Machine (mL) 2009 mL  Net UF (mL) 1509 mL  Tolerated HD Treatment Yes  Hemodialysis Catheter Right Internal jugular Double-lumen;Temporary  Placement Date/Time: 01/31/18 0025   Placed prior to admission: No  Time Out: Correct patient;Correct site;Correct procedure  Maximum sterile barrier precautions: Sterile gown;Cap;Mask;Large sterile sheet;Hand hygiene;Sterile gloves  Site Prep: Skin P...  Site Condition No complications  Blue Lumen Status Heparin locked  Red Lumen Status Heparin locked  Purple Lumen Status N/A  Catheter fill solution Heparin 1000 units/ml  Catheter fill volume (Arterial) 1.3 cc  Catheter fill volume (Venous) 1.3  Dressing Type Biopatch;Occlusive  Dressing Status Dressing changed  Interventions New dressing  Dressing Change Due 02-19-18  Post treatment catheter status Capped and Clamped

## 2018-02-02 NOTE — Progress Notes (Signed)
HD Tx completed, unable return venous side, while rinsing a clotted was pushed into the line and blocked any further movement of saline, tx tolerated well otherwise.    03/01/2018 1530  Hand-Off documentation  Report given to (Full Name) Hedwig Morton, RN   Report received from (Full Name) Beatris Ship, RN   Vital Signs  Temp 98.6 F (37 C)  Temp Source Bladder  Pulse Rate (!) 106  Pulse Rate Source Monitor  Resp (!) 23  BP (!) 158/86  BP Location Left Arm  BP Method Automatic  Patient Position (if appropriate) Lying  Oxygen Therapy  SpO2 97 %  O2 Device Ventilator  Pulse Oximetry Type Continuous  End Tidal CO2 (EtCO2) 36  Dialysis Weight  Weight 118.7 kg (261 lb 11 oz)  Type of Weight Post-Dialysis  During Hemodialysis Assessment  HD Safety Checks Performed Yes  KECN 63.4 KECN  Dialysis Fluid Bolus Normal Saline  Bolus Amount (mL) 250 mL  Intra-Hemodialysis Comments Tx completed;Tolerated well

## 2018-02-02 NOTE — Progress Notes (Signed)
[facility]  ROUNDING NOTE   Subjective:  Patient continues to have minimal urine output. Urine output was 236 cc over the preceding 24 hours. Patient going for tracheostomy today. Patient taken off of CRRT at this time.   Objective:  Vital signs in last 24 hours:  Temp:  [95.4 F (35.2 C)-99.1 F (37.3 C)] 99.1 F (37.3 C) (08/01 0800) Pulse Rate:  [85-109] 103 (08/01 0800) Resp:  [20-32] 23 (08/01 0800) BP: (135-174)/(76-98) 159/91 (08/01 0800) SpO2:  [93 %-97 %] 93 % (08/01 0849) FiO2 (%):  [30 %] 30 % (08/01 0849) Weight:  [121 kg (266 lb 12.1 oz)] 121 kg (266 lb 12.1 oz) (08/01 0436)  Weight change: -3.7 kg (-8 lb 2.5 oz) Filed Weights   01/31/18 0500 02/01/18 0449 02/15/2018 0436  Weight: 118.9 kg (262 lb 2 oz) 124.7 kg (274 lb 14.6 oz) 121 kg (266 lb 12.1 oz)    Intake/Output: I/O last 3 completed shifts: In: 4072.2 [I.V.:837.3; IV Piggyback:3234.9] Out: 1254 [Urine:326; Other:928]   Intake/Output this shift:  Total I/O In: 24.3 [I.V.:24.3] Out: -   Physical Exam: General: Critically ill appearing  Head: Normocephalic, atraumatic. Moist oral mucosal membranes  Eyes: Anicteric  Neck: Supple, trachea midline  Lungs:  Clear to auscultation, vent assisted  Heart: S1S2 no rubs  Abdomen:  Soft, nontender, bowel sounds present  Extremities: 1+ peripheral edema.  Neurologic: Comatose  Skin: No lesions  Access: Right femoral dialysis catheter    Basic Metabolic Panel: Recent Labs  Lab 02/01/18 0812 02/01/18 1218 02/01/18 1645 02/01/18 2021 02/07/2018 0012 02/07/2018 0432  NA 136 136 137  --  136 137  K 4.2 4.5 4.8  --  5.0 4.9  CL 104 102 104  --  103 104  CO2 24 25 25   --  27 26  GLUCOSE 120* 119* 115*  --  140* 105*  BUN 38* 41* 39*  --  39* 39*  CREATININE 2.64* 2.63* 2.64*  --  2.39* 2.56*  CALCIUM 7.8* 8.2* 8.3*  --  8.2* 8.4*  MG 2.2 2.2 2.3 2.3 2.3 2.2  PHOS 5.0* 5.1* 4.7*  --  4.4 4.1    Liver Function Tests: Recent Labs  Lab  01/28/18 0034 01/28/18 0433  02/01/18 0812 02/01/18 1218 02/01/18 1645 02/12/2018 0012 03/02/2018 0432  AST 58* 59*  --   --   --   --   --   --   ALT 31 36  --   --   --   --   --   --   ALKPHOS 49 54  --   --   --   --   --   --   BILITOT 0.5 1.0  --   --   --   --   --   --   PROT 5.8* 6.7  --   --   --   --   --   --   ALBUMIN 2.2* 2.6*   < > 1.8* 2.0* 2.1* 2.0* 2.1*   < > = values in this interval not displayed.   No results for input(s): LIPASE, AMYLASE in the last 168 hours. No results for input(s): AMMONIA in the last 168 hours.  CBC: Recent Labs  Lab 01/28/18 0034 01/28/18 0432 01/29/18 0210 02/01/18 0446 02/07/2018 0432  WBC 22.5* 33.8* 25.9* 23.8* 19.4*  NEUTROABS  --   --   --  19.7* 14.9*  HGB 10.3* 11.9* 10.3* 8.9* 8.9*  HCT 30.9*  34.5* 30.1* 26.3* 26.4*  MCV 96.2 93.5 92.2 91.6 91.9  PLT 288 268 113* 100* 111*    Cardiac Enzymes: Recent Labs  Lab 01/28/18 0433 01/28/18 0816 01/28/18 1440 01/28/18 2151 01/29/18 0210 01/30/18 0006  CKTOTAL  --   --   --   --   --  385  TROPONINI 0.50* 0.90* 1.17* 1.36* 1.35*  --     BNP: Invalid input(s): POCBNP  CBG: Recent Labs  Lab 02/01/18 1557 02/01/18 1955 02/01/18 2328 02/27/2018 0334 02/22/2018 0738  GLUCAP 100* 116* 134* 108* 111*    Microbiology: Results for orders placed or performed during the hospital encounter of 01/22/2018  MRSA PCR Screening     Status: None   Collection Time: 01/28/18  5:00 AM  Result Value Ref Range Status   MRSA by PCR NEGATIVE NEGATIVE Final    Comment:        The GeneXpert MRSA Assay (FDA approved for NASAL specimens only), is one component of a comprehensive MRSA colonization surveillance program. It is not intended to diagnose MRSA infection nor to guide or monitor treatment for MRSA infections. Performed at [facility], [address], [place], [place] [address]   Urine Culture     Status: None   Collection Time: 01/28/18  6:16 AM  Result Value Ref  Range Status   Specimen Description   Final    URINE, RANDOM Performed at [facility], [address], [place], [place] [address]    Special Requests   Final    NONE Performed at [facility], [address], [place], [place] [address]    Culture   Final    NO GROWTH Performed at [facility], [place] [employer] [address] [place], [place] [address]    Report Status 01/29/2018 FINAL  Final  Culture, blood (Routine X 2) w Reflex to ID Panel     Status: None   Collection Time: 01/28/18  6:45 AM  Result Value Ref Range Status   Specimen Description BLOOD LAC  Final   Special Requests   Final    BOTTLES DRAWN AEROBIC AND ANAEROBIC Blood Culture adequate volume   Culture   Final    NO GROWTH 5 DAYS Performed at [facility], [address] [place], [place] [address]    Report Status 02/12/2018 FINAL  Final  Culture, blood (Routine X 2) w Reflex to ID Panel     Status: None   Collection Time: 01/28/18  6:56 AM  Result Value Ref Range Status   Specimen Description BLOOD RAC  Final   Special Requests   Final    BOTTLES DRAWN AEROBIC AND ANAEROBIC Blood Culture results may not be optimal due to an inadequate volume of blood received in culture bottles   Culture   Final    NO GROWTH 5 DAYS Performed at [facility], [address], [place], [place] [address]    Report Status 02/12/2018 FINAL  Final  Culture, respiratory (non-expectorated)     Status: None   Collection Time: 01/28/18 11:24 AM  Result Value Ref Range Status   Specimen Description   Final    TRACHEAL ASPIRATE Performed at [facility], [address], [place], [place] [address]    Special Requests   Final    NONE Performed at [facility], [address] [place],  [address]    Gram Stain   Final    RARE WBC PRESENT,BOTH PMN AND MONONUCLEAR RARE Lonell Grandchild  POSITIVE COCCI IN PAIRS    Culture   Final    RARE Consistent with normal respiratory  flora. Performed at [facility], [place] [address], [place], [place] [address]    Report Status 01/30/2018 FINAL  Final    Coagulation Studies: No results for input(s): LABPROT, INR in the last 72 hours.  Urinalysis: No results for input(s): COLORURINE, LABSPEC, PHURINE, GLUCOSEU, HGBUR, BILIRUBINUR, KETONESUR, PROTEINUR, UROBILINOGEN, NITRITE, LEUKOCYTESUR in the last 72 hours.  Invalid input(s): APPERANCEUR    Imaging: Dg Chest Port 1 View  Result Date: 02/01/2018 CLINICAL DATA:  Cardiac and respiratory arrest.  Post intubation. EXAM: PORTABLE CHEST 1 VIEW COMPARISON:  01/31/2018 FINDINGS: Endotracheal tube 6.9 cm above the carina. Advancement with 3-4 cm may be considered. Central lines in stable position. Enlarged globular heart.  Mediastinal contours appear intact. Bilateral pleural effusions and interstitial pulmonary edema. More focal airspace consolidation in the right lung base cannot be excluded. Osseous structures are without acute abnormality. Soft tissues are grossly normal. IMPRESSION: Endotracheal tube 6.9 cm above the carina. Advancement with 3-4 cm may be considered. Enlarged globular heart. This may be due to cardiomegaly or pericardial effusion. Interstitial pulmonary edema with moderate in size bilateral pleural effusions. More focal airspace consolidation in the right lung base cannot be excluded. Electronically Signed   By: Fidela Salisbury M.D.   On: 02/01/2018 09:36   Dg Chest Port 1 View  Result Date: 01/31/2018 CLINICAL DATA:  Check endotracheal tube EXAM: PORTABLE CHEST 1 VIEW COMPARISON:  01/30/2018 FINDINGS: Endotracheal tube is noted at the thoracic inlet. Bilateral jugular catheters are again noted and stable. Cardiac shadow is stable but enlarged. Persistent right-sided infiltrate and effusion is seen. The left lung is clear. IMPRESSION: Right-sided effusion and infiltrate stable from the prior exam. Tubes and lines as described. Electronically Signed    By: Inez Catalina M.D.   On: 01/31/2018 10:49     Medications:   . sodium chloride    . sodium chloride 10 mL/hr at 02/22/2018 0207  . ceFEPime (MAXIPIME) IV    . famotidine (PEPCID) IV Stopped (02/01/18 2238)  . propofol (DIPRIVAN) infusion 20 mcg/kg/min (02/12/2018 0422)  . pureflow Stopped (02/04/2018 8127)   . chlorhexidine gluconate (MEDLINE KIT)  15 mL Mouth Rinse BID  . heparin injection (subcutaneous)  5,000 Units Subcutaneous Q8H  . insulin aspart  0-20 Units Subcutaneous Q4H  . insulin glargine  5 Units Subcutaneous QHS  . ipratropium-albuterol  3 mL Nebulization Q6H  . mouth rinse  15 mL Mouth Rinse 10 times per day  . metoprolol tartrate  5 mg Intravenous Q6H  . sodium chloride flush  10-40 mL Intracatheter Q12H   sodium chloride, acetaminophen **OR** acetaminophen, fentaNYL, heparin, heparin, hydrALAZINE, sodium chloride flush  Assessment/ Plan:  57 y.o. male  with a PMHx of diabetes mellitus type 2, hypertension, obstructive sleep apnea, who was admitted to [facility] on 01/12/2018 for evaluation of cardiac arrest that occurred at home.   1.  Acute renal failure, oligoanuric. 2.  Metabolic acidosis improved with CRRT 3.  Acute respiratory failure, on the ventilator.  4.  Cardiac arrest, completed hypothermia protocol. 5.  Anemia unspecified.  Plan: Urine output was only 236 cc over the preceding 24 hours.  Patient remains critically ill at the moment.  He will be going down for tracheostomy placement today.  We have taken him off of CRRT and we will transition him to intermittent hemodialysis.  We do plan for a dialysis session later  today.  Thereafter we will plan for another dialysis session on Saturday if he remains oliguric.  Overall prognosis quite guarded given neurologic status as before.    LOS: 5 Gelila Well 8/1/20198:55 AM

## 2018-02-02 NOTE — Progress Notes (Signed)
[place] [facility] [place] NAME: [name]    MR#:  [id number]  DATE OF BIRTH:  05/07/1961  SUBJECTIVE:   Pt. Could not have a Trach as pt's became hypoxic lying flat on the OR table and required more than 40% oxygen.  Plan for trach next week.  Family also amenable to PEG and that to be done possibly later this week or next week.  Remains sedated on some propofol, intubated.  REVIEW OF SYSTEMS:    Review of Systems  Unable to perform ROS: Intubated    Nutrition: NPO Tolerating Diet: No  DRUG ALLERGIES:   Allergies  Allergen Reactions  . Enalapril Maleate Anaphylaxis  . Iodinated Diagnostic Agents Itching  . Isosorbide Mononitrate Er [Isosorbide Dinitrate] Other (See Comments)    "made me not be able to think or function"  . Canagliflozin Other (See Comments)    VITALS:  Blood pressure (!) 157/86, pulse (!) 105, temperature 98.6 F (37 C), resp. rate (!) 21, height 6\' 1"  (1.854 m), weight 120.9 kg (266 lb 8.6 oz), SpO2 96 %.  PHYSICAL EXAMINATION:   Physical Exam  GENERAL:  57 y.o.-year-old patient lying in bed intubated & Sedated.  EYES: PERRL but minimally responsive.  Patient's right eye is adducted. HEENT: Head atraumatic, normocephalic. ET and OG tubes in place.   NECK:  Supple, no jugular venous distention. No thyroid enlargement, no tenderness.  LUNGS: Normal breath sounds bilaterally, no wheezing, rales, rhonchi. No use of accessory muscles of respiration.  CARDIOVASCULAR: S1, S2 normal. No murmurs, rubs, or gallops.  ABDOMEN: Soft, nontender, nondistended. Bowel sounds present. No organomegaly or mass.  EXTREMITIES: No cyanosis, clubbing or edema b/l.    NEUROLOGIC: Sedated & Intubated  PSYCHIATRIC: Sedated & Intubated.   SKIN: No obvious rash, lesion, or ulcer.    LABORATORY PANEL:   CBC Recent Labs  Lab 02/17/2018 0432  WBC 19.4*  HGB 8.9*  HCT 26.4*  PLT 111*    ------------------------------------------------------------------------------------------------------------------  Chemistries  Recent Labs  Lab 01/28/18 0433  02/14/2018 0432  NA 141   < > 137  K 4.7   < > 4.9  CL 105   < > 104  CO2 27   < > 26  GLUCOSE 333*   < > 105*  BUN 23*   < > 39*  CREATININE 1.77*   < > 2.56*  CALCIUM 9.1   < > 8.4*  MG  --    < > 2.2  AST 59*  --   --   ALT 36  --   --   ALKPHOS 54  --   --   BILITOT 1.0  --   --    < > = values in this interval not displayed.   ------------------------------------------------------------------------------------------------------------------  Cardiac Enzymes Recent Labs  Lab 01/29/18 0210  TROPONINI 1.35*   ------------------------------------------------------------------------------------------------------------------  RADIOLOGY:  Dg Chest Port 1 View  Result Date: 02/01/2018 CLINICAL DATA:  Cardiac and respiratory arrest.  Post intubation. EXAM: PORTABLE CHEST 1 VIEW COMPARISON:  01/31/2018 FINDINGS: Endotracheal tube 6.9 cm above the carina. Advancement with 3-4 cm may be considered. Central lines in stable position. Enlarged globular heart.  Mediastinal contours appear intact. Bilateral pleural effusions and interstitial pulmonary edema. More focal airspace consolidation in the right lung base cannot be excluded. Osseous structures are without acute abnormality. Soft tissues are grossly normal. IMPRESSION: Endotracheal tube 6.9 cm above the carina. Advancement with 3-4 cm may be considered. Enlarged globular  heart. This may be due to cardiomegaly or pericardial effusion. Interstitial pulmonary edema with moderate in size bilateral pleural effusions. More focal airspace consolidation in the right lung base cannot be excluded. Electronically Signed   By: Fidela Salisbury M.D.   On: 02/01/2018 09:36     ASSESSMENT AND PLAN:   57 year old male with past medical history of ischemic cardiomyopathy ejection  fraction of 20 to 93%, chronic systolic CHF, Obstructive sleep apnea, hypertension, diabetes who presented to the hospital as he was found unresponsive and noted to be in pulseless cardiac arrest.  1.  Pulseless cardiac arrest-as a result of worsening respiratory failure and flash pulmonary edema. - Patient was resuscitated in the ER for about 17 minutes prior to regaining spontaneous circulation. - No further arrhythmias but prognosis is quite poor but the patient's wife wants to continue aggressive care.  2.  Respiratory failure-patient was intubated after his cardiac arrest.  Not showing any signs of meaningful neurological recovery.  Family wants to continue aggressive care.  -Patient could not tolerate tracheostomy today and plan for trach next week. -Patient has increasing secretions and thought to have pneumonia suspected aspiration.  Continue cefepime for now.  Continue pulmonary toileting as per intensivist.  3.  Acute kidney injury with oliguria-secondary to underlying cardiac arrest and cardiorenal hemodynamics. -Not improved with fluids and Levophed.  - Urine output still remains quite poor with only 236 cc over the past 24 hours.   -Nephrology following and plan to wean him off CRRT and start him on intermittent HD.  Plan for hemodialysis later today and may be another session Saturday.  4.  Altered mental status/encephalopathy-suspected to be secondary to underlying anoxic brain injury. - Neurology was consulted, they have explained to the patient's wife about patient's poor prognosis and unlikely that he would recover from this.   -As per neurology patient does not meet fulfill criteria for brain death.  Family and wife are hopeful. - Continue current supportive care.  - repeat CT head showing global edema, EEG showing generalized slowing possibly consistent with underlying anoxic brain injury.   Prognosis is quite poor.    5.  Sepsis/septic shock- thought to have possible  aspiration pneumonia.   Continue empiric cefepime. - pt. Off vasopressors now.  Cultures remain (-).     6.  History of ischemic cardiomyopathy with ejection fraction of 20 to 25%- patient does not appear to be volume overloaded presently.   7.  Leukocytosis-secondary to suspected sepsis.  Follow with IV antibiotic therapy and it's improving.  Patient has a very poor prognosis given his multiple comorbidities and now with multiorgan failure and likely anoxic brain injury.  Family and wife want to cont. Aggressive care.     All the records are reviewed and case discussed with Care Management/Social Worker. Management plans discussed with the patient, family and they are in agreement.  CODE STATUS: Full code  DVT Prophylaxis: Hep SQ  TOTAL TIME TAKING CARE OF THIS PATIENT: 30 minutes.   POSSIBLE D/C IN unclear, DEPENDING ON CLINICAL CONDITION and course   Henreitta Leber M.D on 02/21/2018 at 2:49 PM  Between 7am to 6pm - Pager - [phone]  After 6pm go to [email or web address] - password [email or web address] [facility]  Office  [phone]  CC: Primary care physician; Marguerita Merles, MD

## 2018-02-02 NOTE — Progress Notes (Signed)
Pre HD Assessment    02/20/2018 1145  Neurological  Level of Consciousness Responds to Pain  Orientation Level Intubated/Tracheostomy - Unable to assess  Respiratory  Respiratory Pattern Regular;Unlabored  Chest Assessment Chest expansion symmetrical  Bilateral Breath Sounds Rhonchi  Cough None  Cardiac  Pulse Regular  Heart Sounds S1, S2  ECG Monitor Yes  Vascular  Edema Generalized  Generalized Edema +3  Psychosocial  Psychosocial (WDL) X  Patient Behaviors Not interactive

## 2018-02-02 NOTE — Progress Notes (Signed)
CRRT stopped at 0620. Patient tolerated.  Has not been very responsive but does get tremors to lower extremities, breaks out in sweat, and gets tachy and hypertensive when sedation turned off. Remains at Propofol at 20 mcg and KVO line. No indications of pain or discomfort. Continue to monitor.

## 2018-02-02 NOTE — Progress Notes (Signed)
HD Tx started    02/08/2018 1200  Vital Signs  Temp 98.6 F (37 C)  Pulse Rate 94  Resp 18  BP (!) 144/88  BP Location Left Arm  BP Method Automatic  Patient Position (if appropriate) Lying  Oxygen Therapy  SpO2 96 %  O2 Device Ventilator  Pulse Oximetry Type Continuous  End Tidal CO2 (EtCO2) 33  During Hemodialysis Assessment  Blood Flow Rate (mL/min) 400 mL/min  Arterial Pressure (mmHg) -120 mmHg  Venous Pressure (mmHg) 120 mmHg  Transmembrane Pressure (mmHg) 50 mmHg  Ultrafiltration Rate (mL/min) 580 mL/min  Dialysate Flow Rate (mL/min) 800 ml/min  Conductivity: Machine  14.4  HD Safety Checks Performed Yes  Dialysis Fluid Bolus Normal Saline  Bolus Amount (mL) 250 mL  Intra-Hemodialysis Comments Tx initiated

## 2018-02-02 NOTE — Progress Notes (Signed)
Pt. Transported to OR with no complications. Trache Procedure was not performed due to patients SATS dropping. Pt. Transported back to ICU.  FiO2 adjusted to 45% to maintain SATS above 92%.

## 2018-02-02 NOTE — Progress Notes (Signed)
Pre HD TX   03/01/2018 1145  Vital Signs  Temp 98.4 F (36.9 C)  Temp Source Core  Pulse Rate (!) 106  Pulse Rate Source Monitor  Resp 17  BP (!) 152/90  BP Location Left Arm  BP Method Automatic  Patient Position (if appropriate) Lying  Oxygen Therapy  SpO2 96 %  O2 Device Ventilator  Pulse Oximetry Type Continuous  End Tidal CO2 (EtCO2) 34  Pain Assessment  Pain Scale CPOT  Pain Score 0  Dialysis Weight  Weight 120.9 kg (266 lb 8.6 oz)  Type of Weight Pre-Dialysis  Time-Out for Hemodialysis  What Procedure? HD   Pt Identifiers(min of two) First/Last Name;MRN/Account#  Correct Site? Yes  Correct Side? Yes  Correct Procedure? Yes  Consents Verified? Yes  Rad Studies Available? N/A  Safety Precautions Reviewed? Yes  Engineer, civil (consulting) Number [phone]  Station Number  (Bedside ICu )  UF/Alarm Test Passed  Conductivity: Meter 14.2  Conductivity: Machine  14.2  pH 7.4  Reverse Osmosis Portable   Normal Saline Lot Number [id number]  Dialyzer Lot Number [id number]  Disposable Set Lot Number [id number]  Machine Temperature 98.6 F (37 C)  Musician and Audible Yes  Blood Lines Intact and Secured Yes  Pre Treatment Patient Checks  Vascular access used during treatment Catheter  Hepatitis B Surface Antigen Results  (Unknown)  Isolation Initiated Yes  Date Hepatitis B Surface Antibody Drawn 02/21/2018  Hemodialysis Consent Verified Yes  ECG (Telemetry) Monitor On Yes  Prime Ordered Normal Saline  Length of  DialysisTreatment -hour(s) 3.5 Hour(s)  Dialysis Treatment Comments Na 140  Dialyzer Elisio 17H NR  Dialysate 3K, 2.5 Ca  Dialysis Anticoagulant None  Dialysate Flow Ordered 800  Blood Flow Rate Ordered 400 mL/min  Ultrafiltration Goal 1.5 Liters  Pre Treatment Labs Hepatitis B Surface Antigen;Phosphorus  Dialysis Blood Pressure Support Ordered Normal Saline  Education / Care Plan  Dialysis Education Provided Yes  Documented Education in Care Plan Yes   Hemodialysis Catheter Right Internal jugular Double-lumen;Temporary  Placement Date/Time: 01/31/18 0025   Placed prior to admission: No  Time Out: Correct patient;Correct site;Correct procedure  Maximum sterile barrier precautions: Sterile gown;Cap;Mask;Large sterile sheet;Hand hygiene;Sterile gloves  Site Prep: Skin P...  Site Condition No complications  Blue Lumen Status Flushed  Red Lumen Status Flushed  Purple Lumen Status N/A

## 2018-02-02 DEATH — deceased

## 2018-02-03 ENCOUNTER — Inpatient Hospital Stay: Payer: BLUE CROSS/BLUE SHIELD

## 2018-02-03 ENCOUNTER — Encounter: Payer: Self-pay | Admitting: Interventional Radiology

## 2018-02-03 ENCOUNTER — Encounter: Payer: Self-pay | Admitting: Anesthesiology

## 2018-02-03 HISTORY — PX: IR GASTROSTOMY TUBE MOD SED: IMG625

## 2018-02-03 LAB — GLUCOSE, CAPILLARY
GLUCOSE-CAPILLARY: 78 mg/dL (ref 70–99)
GLUCOSE-CAPILLARY: 91 mg/dL (ref 70–99)
Glucose-Capillary: 88 mg/dL (ref 70–99)
Glucose-Capillary: 108 mg/dL — ABNORMAL HIGH (ref 70–99)
Glucose-Capillary: 116 mg/dL — ABNORMAL HIGH (ref 70–99)
Glucose-Capillary: 95 mg/dL (ref 70–99)

## 2018-02-03 LAB — BASIC METABOLIC PANEL
Anion gap: 6 (ref 5–15)
BUN: 51 mg/dL — AB (ref 6–20)
CO2: 29 mmol/L (ref 22–32)
CREATININE: 3.43 mg/dL — AB (ref 0.61–1.24)
Calcium: 8 mg/dL — ABNORMAL LOW (ref 8.9–10.3)
Chloride: 105 mmol/L (ref 98–111)
GFR calc non Af Amer: 18 mL/min — ABNORMAL LOW (ref >60)
GFR, EST AFRICAN AMERICAN: 21 mL/min — AB (ref >60)
Glucose, Bld: 93 mg/dL (ref 70–99)
Potassium: 4.5 mmol/L (ref 3.5–5.1)
Sodium: 140 mmol/L (ref 135–145)

## 2018-02-03 LAB — HEPATITIS B SURFACE ANTIGEN: Hepatitis B Surface Ag: NEGATIVE

## 2018-02-03 MED ORDER — SODIUM CHLORIDE 0.9 % IV SOLN
1.0000 g | Freq: Two times a day (BID) | INTRAVENOUS | Status: DC
  Administered 2018-02-03 – 2018-02-05 (×5): 1 g via INTRAVENOUS
  Filled 2018-02-03 (×6): qty 1

## 2018-02-03 MED ORDER — LIDOCAINE HCL (PF) 1 % IJ SOLN
INTRAMUSCULAR | Status: AC
  Filled 2018-02-03: qty 30

## 2018-02-03 MED ORDER — GLUCAGON HCL RDNA (DIAGNOSTIC) 1 MG IJ SOLR
INTRAMUSCULAR | Status: AC
  Administered 2018-02-03: 14:00:00
  Filled 2018-02-03: qty 1

## 2018-02-03 MED ORDER — LIDOCAINE HCL 1 % IJ SOLN
INTRAMUSCULAR | Status: AC | PRN
  Administered 2018-02-03: 5 mL via INTRADERMAL

## 2018-02-03 MED ORDER — GLUCAGON HCL (RDNA) 1 MG IJ SOLR
INTRAMUSCULAR | Status: AC | PRN
  Administered 2018-02-03: 1 mg via INTRAVENOUS

## 2018-02-03 MED ORDER — IOPAMIDOL (ISOVUE-300) INJECTION 61%
30.0000 mL | Freq: Once | INTRAVENOUS | Status: AC | PRN
  Administered 2018-02-03: 15 mL

## 2018-02-03 NOTE — Progress Notes (Signed)
Follow up - Critical Care Medicine Note  Patient Details:    [name] is an 57 y.o. male.with a history of type 2 diabetes hypertension and sleep apnea who was brought to the ED by his spouse in cardiac and respiratory arrest.    Lines, Airways, Drains: Airway 7 mm (Active)  Secured at (cm) 24 cm 01/29/2018  8:04 AM  Measured From Lips 01/29/2018  8:04 AM  [place] 01/29/2018  8:04 AM  Secured By [employer] 01/29/2018  8:04 AM  Tube Holder Repositioned Yes 01/29/2018  3:38 AM  Cuff Pressure (cm H2O) 25 cm H2O 01/29/2018  8:04 AM  Site Condition Dry 01/29/2018  8:04 AM     CVC Triple Lumen 01/28/18 Left Internal jugular (Active)  Indication for Insertion or Continuance of Line Vasoactive infusions 01/29/2018  8:00 AM  Site Assessment Clean;Dry;Intact 01/29/2018  8:00 AM  Proximal Lumen Status Infusing;Flushed;Blood return noted 01/29/2018  8:00 AM  Medial Lumen Status Infusing;Flushed;Blood return noted 01/29/2018  8:00 AM  Distal Lumen Status Infusing;Flushed;Blood return noted 01/29/2018  8:00 AM  Dressing Type Transparent;Occlusive 01/29/2018  8:00 AM  Dressing Status Clean;Dry;Intact;Antimicrobial disc in place 01/29/2018  8:00 AM  Line Care Connections checked and tightened;Zeroed and calibrated;Leveled 01/29/2018  8:00 AM  Dressing Intervention New dressing 01/28/2018  4:00 AM  Dressing Change Due 02/04/18 01/29/2018  8:00 AM     Arterial Line 01/28/18 Right Radial (Active)  Site Assessment Clean;Dry;Intact 01/29/2018  8:00 AM  Line Status Pulsatile blood flow;Positional 01/29/2018  8:00 AM  Art Line Waveform Appropriate 01/29/2018  8:00 AM  Art Line Interventions Zeroed and calibrated 01/29/2018  8:00 AM  Color/Movement/Sensation Capillary refill less than 3 sec 01/29/2018  8:00 AM  Dressing Type Transparent;Occlusive 01/29/2018  8:00 AM  Dressing Status Clean;Intact;Dry;Antimicrobial disc in place 01/29/2018  8:00 AM  Interventions New dressing;Antimicrobial disc  changed 01/28/2018  4:00 AM  Dressing Change Due 02/04/18 01/29/2018  8:00 AM     Urethral Catheter THAHN, ed tech Straight-tip;Temperature probe 16 Fr. (Active)  Indication for Insertion or Continuance of Catheter Unstable critical patients (first 24-48 hours) 01/29/2018  8:00 AM  Site Assessment Clean;Intact;Dry 01/29/2018  8:00 AM  Catheter Maintenance Bag below level of bladder;Catheter secured;Drainage bag/tubing not touching floor 01/29/2018  8:00 AM  Collection Container Standard drainage bag 01/29/2018  8:00 AM  Securement Method Other (Comment) 01/29/2018  8:00 AM  Output (mL) 0 mL 01/29/2018  8:00 AM    Anti-infectives:  Anti-infectives (From admission, onward)   Start     Dose/Rate Route Frequency Ordered Stop   02/18/2018 1800  ceFEPIme (MAXIPIME) 2 g in sodium chloride 0.9 % 100 mL IVPB    Note to Pharmacy:  7 day stop date. Thank you   2 g 200 mL/hr over 30 Minutes Intravenous Every 12 hours 02/07/2018 0753 02/04/18 0559   01/30/18 2200  vancomycin (VANCOCIN) IVPB 1000 mg/200 mL premix  Status:  Discontinued     1,000 mg 200 mL/hr over 60 Minutes Intravenous Every 24 hours 01/30/18 2043 02/13/2018 0753   01/30/18 0500  vancomycin (VANCOCIN) 1,250 mg in sodium chloride 0.9 % 250 mL IVPB  Status:  Discontinued     1,250 mg 166.7 mL/hr over 90 Minutes Intravenous Every 18 hours 01/29/18 0956 01/30/18 0308   01/30/18 0307  vancomycin (VANCOCIN) 1,250 mg in sodium chloride 0.9 % 250 mL IVPB  Status:  Discontinued     1,250 mg 166.7 mL/hr over 90 Minutes Intravenous As needed 01/30/18  0308 02/01/18 1549   01/28/18 1400  vancomycin (VANCOCIN) 1,250 mg in sodium chloride 0.9 % 250 mL IVPB  Status:  Discontinued     1,250 mg 166.7 mL/hr over 90 Minutes Intravenous Every 18 hours 01/28/18 0544 01/29/18 0956   01/28/18 0600  ceFEPIme (MAXIPIME) 2 g in sodium chloride 0.9 % 100 mL IVPB  Status:  Discontinued     2 g 200 mL/hr over 30 Minutes Intravenous Every 12 hours 01/28/18 0544 02/21/2018 0753    01/28/18 0545  vancomycin (VANCOCIN) 1,250 mg in sodium chloride 0.9 % 250 mL IVPB     1,250 mg 166.7 mL/hr over 90 Minutes Intravenous  Once 01/28/18 0544 01/28/18 1020      Microbiology: Results for orders placed or performed during the hospital encounter of 01/19/2018  MRSA PCR Screening     Status: None   Collection Time: 01/28/18  5:00 AM  Result Value Ref Range Status   MRSA by PCR NEGATIVE NEGATIVE Final    Comment:        The GeneXpert MRSA Assay (FDA approved for NASAL specimens only), is one component of a comprehensive MRSA colonization surveillance program. It is not intended to diagnose MRSA infection nor to guide or monitor treatment for MRSA infections. Performed at [facility], [address] [place], [place] [address]   Urine Culture     Status: None   Collection Time: 01/28/18  6:16 AM  Result Value Ref Range Status   Specimen Description   Final    URINE, RANDOM Performed at [facility], [address], [place], [place] [address]    Special Requests   Final    NONE Performed at [facility], [address] [place], [place] [address]    Culture   Final    NO GROWTH Performed at [facility], [place] [address], [place], [place] [address]    Report Status 01/29/2018 FINAL  Final  Culture, blood (Routine X 2) w Reflex to ID Panel     Status: None   Collection Time: 01/28/18  6:45 AM  Result Value Ref Range Status   Specimen Description BLOOD LAC  Final   Special Requests   Final    BOTTLES DRAWN AEROBIC AND ANAEROBIC Blood Culture adequate volume   Culture   Final    NO GROWTH 5 DAYS Performed at [facility], [address] [place], [place] [address]    Report Status 02/08/2018 FINAL  Final  Culture, blood (Routine X 2) w Reflex to ID Panel     Status: None   Collection Time: 01/28/18  6:56 AM  Result Value Ref Range Status   Specimen Description BLOOD RAC  Final   Special Requests   Final     BOTTLES DRAWN AEROBIC AND ANAEROBIC Blood Culture results may not be optimal due to an inadequate volume of blood received in culture bottles   Culture   Final    NO GROWTH 5 DAYS Performed at [facility], [address], [place], [place] [address]    Report Status 02/27/2018 FINAL  Final  Culture, respiratory (non-expectorated)     Status: None   Collection Time: 01/28/18 11:24 AM  Result Value Ref Range Status   Specimen Description   Final    TRACHEAL ASPIRATE Performed at [facility], [address], [place], [place] [address]    Special Requests   Final    NONE Performed at [facility], [address] [place], [place] [address]    Gram Stain   Final    RARE WBC PRESENT,BOTH PMN AND MONONUCLEAR RARE GRAM POSITIVE COCCI IN PAIRS    Culture   Final    RARE Consistent with normal respiratory flora. Performed at [facility], [place] [address] [place], [place] [address]    Report Status 01/30/2018 FINAL  Final   Events:   Studies: Dg Abd 1 View  Result Date: 01/28/2018 CLINICAL DATA:  NG tube placement EXAM: ABDOMEN - 1 VIEW COMPARISON:  01/20/2018 FINDINGS: No enteric tube is demonstrated within the field of view. Mild gaseous distention of the stomach. IMPRESSION: No enteric tube visualized. Electronically Signed   By: Lucienne Capers M.D.   On: 01/28/2018 05:53   Dg Abd 1 View  Result Date: 01/20/2018 CLINICAL DATA:  Encounter for NG tube placement EXAM: ABDOMEN - 1 VIEW COMPARISON:  01/20/2018 FINDINGS: Malpositioned nasogastric tube, coiled backwards overlying the mid esophagus with tip excluded from view Endotracheal tube overlies the tracheal air column with tip superior to the carina. No pneumothorax. Pacer leads overlie the left ventricular apex. Pulmonary vasculature remains indistinct with cephalization of flow. Suspected trace right-sided pleural effusion. No definite left-sided pleural effusion, though the left costophrenic  angle is excluded from view. Nonobstructive bowel gas pattern. No pneumoperitoneum, pneumatosis or portal venous gas. IMPRESSION: 1. Malpositioned nasogastric tube.  Repositioning is advised. 2. Similar findings of pulmonary edema and trace right-sided pleural effusion. 3. Nonobstructive bowel gas pattern. Electronically Signed   By: Sandi Mariscal M.D.   On: 01/20/2018 14:03   Dg Abd 1 View  Result Date: 01/20/2018 CLINICAL DATA:  NG tube placement EXAM: ABDOMEN - 1 VIEW COMPARISON:  None. FINDINGS: There is no nasogastric tube identified in the lower chest or abdomen. There is mild gaseous distension of the colon and stomach. There is no evidence of pneumoperitoneum, portal venous gas or pneumatosis. There are no pathologic calcifications along the expected course of the ureters. The osseous structures are unremarkable. IMPRESSION: No nasogastric tube identified in the lower chest or abdomen. Electronically Signed   By: Kathreen Devoid   On: 01/20/2018 12:42   Dg Abd 1 View  Result Date: 01/20/2018 CLINICAL DATA:  Abdominal pain. EXAM: ABDOMEN - 1 VIEW COMPARISON:  None. FINDINGS: A right-sided pleural effusion is suspected layering posteriorly. Pacer paddles are noted on the chest. The bowel gas pattern is unremarkable. Air and stool scattered throughout the colon. A few scattered air-filled small bowel loops. No obvious free air. A right femoral line is noted. Bony structures are unremarkable. IMPRESSION: Suspect right pleural effusion. No plain film findings for an acute abdominal process. Electronically Signed   By: Marijo Sanes M.D.   On: 01/20/2018 11:22   Ct Head Wo Contrast  Result Date: 01/30/2018 CLINICAL DATA:  Anoxic brain injury EXAM: CT HEAD WITHOUT CONTRAST TECHNIQUE: Contiguous axial images were obtained from the base of the skull through the vertex without intravenous contrast. COMPARISON:  Head CT 01/28/2018 FINDINGS: Brain: There is diffuse blurring of the gray-white interface, slightly  greater than on the prior examination. There is crowding of the basal cisterns. No acute hemorrhage or other extra-axial collection. Vascular: No abnormal hyperdensity of the major intracranial arteries or dural venous sinuses. No intracranial atherosclerosis. Skull: The visualized skull base, calvarium and extracranial soft tissues are normal. Sinuses/Orbits: No fluid levels or advanced mucosal thickening of the visualized paranasal sinuses. No mastoid or middle ear effusion. The orbits are normal. IMPRESSION: Diffuse  blurring of the gray-white interface, compatible with global edema, slightly progressed compared to 01/28/2018. Mild worsening of basal cisternal crowding. Electronically Signed   By: Ulyses Jarred M.D.   On: 01/30/2018 13:17   Ct Head Wo Contrast  Result Date: 01/28/2018 CLINICAL DATA:  Cardiac arrest. History of hypertension and diabetes. EXAM: CT HEAD WITHOUT CONTRAST TECHNIQUE: Contiguous axial images were obtained from the base of the skull through the vertex without intravenous contrast. COMPARISON:  CT neck January 20, 2018. FINDINGS: BRAIN: No intraparenchymal hemorrhage, mass effect nor midline shift. Mild blurring of the gray-white matter differentiation. Patchy supratentorial white matter hypodensities. The ventricles and sulci are normal. No acute large vascular territory infarcts. No abnormal extra-axial fluid collections. Basal cisterns are patent. VASCULAR: Mild calcific atherosclerosis carotid bifurcations. SKULL/SOFT TISSUES: No skull fracture. No significant soft tissue swelling. ORBITS/SINUSES: The included ocular globes and orbital contents are normal.Mild paranasal sinus mucosal thickening without air-fluid levels. Included mastoid air cells are well aerated. OTHER: Life-support lines in place. IMPRESSION: 1. 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 2. Mild chronic small vessel ischemic changes. 3. Mild atherosclerosis. 4. Acute findings discussed with  and reconfirmed by Dr.ALLISON WEBSTER on 01/28/2018 at 1:00 am. Electronically Signed   By: Elon Alas M.D.   On: 01/28/2018 01:04   Ct Soft Tissue Neck Wo Contrast  Result Date: 01/20/2018 CLINICAL DATA:  57 y/o  M; thyroid nodule. EXAM: CT NECK WITHOUT CONTRAST TECHNIQUE: Multidetector CT imaging of the neck was performed following the standard protocol without intravenous contrast. COMPARISON:  None. FINDINGS: Pharynx and larynx: Debris within the airways from intubation. Salivary glands: No inflammation, mass, or stone. Thyroid: 5.6 cm nodule within the left lobe of the thyroid. 18 mm nodule in the right lobe of thyroid. Lymph nodes: None enlarged or abnormal density. Vascular: Negative. Limited intracranial: Negative. Visualized orbits: Negative. Mastoids and visualized paranasal sinuses: Mild mucosal thickening of the left maxillary sinus. Normal aeration of the mastoid air cells. Skeleton: No acute or aggressive process. Dental disease with multiple periapical cysts and absent crowns. Mild cervical spondylosis greatest at the C5-6 level. Upper chest: Endotracheal tube tip 3.4 cm above the carina. Moderate bilateral pleural effusions and dependent atelectasis of the lungs. Other: None. IMPRESSION: 1. Thyroid nodules measuring up to 5.6 cm in the left lobe of thyroid. Mass effect from the large left lobe of thyroid nodule displaces the airway rightward. 2. Endotracheal tube tip 3.4 cm above the carina. 3. Moderate bilateral pleural effusion with dependent atelectasis of the lungs. Electronically Signed   By: Kristine Garbe M.D.   On: 01/20/2018 04:44   Dg Chest Port 1 View  Result Date: 02/01/2018 CLINICAL DATA:  Cardiac and respiratory arrest.  Post intubation. EXAM: PORTABLE CHEST 1 VIEW COMPARISON:  01/31/2018 FINDINGS: Endotracheal tube 6.9 cm above the carina. Advancement with 3-4 cm may be considered. Central lines in stable position. Enlarged globular heart.  Mediastinal contours  appear intact. Bilateral pleural effusions and interstitial pulmonary edema. More focal airspace consolidation in the right lung base cannot be excluded. Osseous structures are without acute abnormality. Soft tissues are grossly normal. IMPRESSION: Endotracheal tube 6.9 cm above the carina. Advancement with 3-4 cm may be considered. Enlarged globular heart. This may be due to cardiomegaly or pericardial effusion. Interstitial pulmonary edema with moderate in size bilateral pleural effusions. More focal airspace consolidation in the right lung base cannot be excluded. Electronically Signed   By: Fidela Salisbury M.D.   On: 02/01/2018 09:36   Dg  Chest Port 1 View  Result Date: 01/31/2018 CLINICAL DATA:  Check endotracheal tube EXAM: PORTABLE CHEST 1 VIEW COMPARISON:  01/30/2018 FINDINGS: Endotracheal tube is noted at the thoracic inlet. Bilateral jugular catheters are again noted and stable. Cardiac shadow is stable but enlarged. Persistent right-sided infiltrate and effusion is seen. The left lung is clear. IMPRESSION: Right-sided effusion and infiltrate stable from the prior exam. Tubes and lines as described. Electronically Signed   By: Inez Catalina M.D.   On: 01/31/2018 10:49   Dg Chest Port 1 View  Result Date: 01/30/2018 CLINICAL DATA:  Central line placement EXAM: PORTABLE CHEST 1 VIEW COMPARISON:  01/30/2018 at 0918 hours FINDINGS: Endotracheal tube terminates 6 cm above the carina. Right IJ dual lumen catheter terminates in the lower SVC. Left IJ venous catheter terminates in the left brachiocephalic vein. Cardiomegaly with mild to moderate interstitial edema. Multifocal pneumonia is also possible. Suspected bilateral pleural effusions, right greater than left. No pneumothorax. IMPRESSION: Endotracheal tube terminates 6 cm above the carina. Right IJ catheter terminates in the lower SVC. Left IJ catheter terminates in the left brachiocephalic vein. Cardiomegaly with suspected mild to moderate  interstitial edema and bilateral pleural effusions. Multifocal pneumonia is also possible. Electronically Signed   By: Julian Hy M.D.   On: 01/30/2018 21:42   Dg Chest Port 1 View  Result Date: 01/30/2018 CLINICAL DATA:  57 y.o. male.with a history of type 2 diabetes hypertension and sleep apnea who was brought to the ED by his spouse in cardiac and respiratory arrest. now on vent, smoker EXAM: PORTABLE CHEST 1 VIEW COMPARISON:  Chest x-rays dated 01/28/2018 and 01/24/2018. FINDINGS: Study is hypoinspiratory. There is continued bilateral pulmonary edema pattern, not significantly changed compared to the most recent study of 01/28/2018, increased compared to the earlier study of 01/24/2018. Given the low lung volumes, heart size and mediastinal contours appear stable. Endotracheal tube tip is at the level of the clavicles, approximately 6 cm above the carina. LEFT jugular vein central line is stable in position with tip at the level of the upper SVC. No pneumothorax seen. Probable small bilateral pleural effusions. IMPRESSION: 1. Continued bilateral pulmonary edema pattern, not significantly changed compared to most recent chest x-ray of 01/28/2018. 2. Probable small bilateral pleural effusions. 3. Cardiomegaly. 4. Support apparatus appears stable in position. Endotracheal tube tip is at the level of the clavicles, approximately 6 cm above the carina. Electronically Signed   By: Franki Cabot M.D.   On: 01/30/2018 09:37   Dg Chest Port 1 View  Result Date: 01/28/2018 CLINICAL DATA:  Line placement EXAM: PORTABLE CHEST 1 VIEW COMPARISON:  01/28/2018 FINDINGS: Endotracheal tube measures 6.8 cm above the carina. A left central venous catheter has been placed with tip over the confluence of the brachiocephalic vein and IVC. Shallow inspiration. Cardiac enlargement. Bilateral perihilar infiltrates. Probable small right pleural effusion. No pneumothorax is visible. IMPRESSION: Appliances appear in  satisfactory position. Cardiac enlargement with bilateral perihilar infiltrates. Small right pleural effusion. No pneumothorax. Electronically Signed   By: Lucienne Capers M.D.   On: 01/28/2018 05:52   Dg Chest Portable 1 View  Result Date: 01/28/2018 CLINICAL DATA:  Short of breath EXAM: PORTABLE CHEST 1 VIEW COMPARISON:  01/24/2018, 01/21/2018 FINDINGS: Interval intubation, tip of the endotracheal tube is about 4 cm superior to the carina. Worsening bilateral interstitial and alveolar disease. No large effusion. Stable mild cardiomegaly. No pneumothorax. IMPRESSION: 1. Endotracheal tube tip about 3.5 cm superior to the carina. 2. Cardiomegaly. Interim  worsening of bilateral interstitial and alveolar airspace disease. Electronically Signed   By: Donavan Foil M.D.   On: 01/28/2018 00:29   Dg Chest Port 1 View  Result Date: 01/24/2018 CLINICAL DATA:  Respiratory failure EXAM: PORTABLE CHEST 1 VIEW COMPARISON:  01/21/2018, 01/20/2018, 12/13/2017 FINDINGS: Removal of the endotracheal tube. Slight increased upper lobe ground-glass opacity. Improved aeration at the bases. Stable slightly enlarged cardiomediastinal silhouette. No pneumothorax. IMPRESSION: 1. Removal of endotracheal tube 2. Improved aeration at the bilateral lung bases. Slight interval increase in bilateral upper lobe ground-glass opacity. 3. Stable mild cardiomegaly Electronically Signed   By: Donavan Foil M.D.   On: 01/24/2018 03:58   Dg Chest Port 1 View  Result Date: 01/21/2018 CLINICAL DATA:  Acute respiratory failure. EXAM: PORTABLE CHEST 1 VIEW COMPARISON:  01/20/2018 FINDINGS: The endotracheal tube is in good position at the mid tracheal level. The lungs demonstrate improved aeration with resolving edema and atelectasis. Probable persistent small layering right pleural effusion. IMPRESSION: Stable position of the endotracheal tube. Improved lung aeration with resolving edema and atelectasis. Electronically Signed   By: Marijo Sanes  M.D.   On: 01/21/2018 08:36   Dg Chest Portable 1 View  Result Date: 01/20/2018 CLINICAL DATA:  57 y/o  M; status post intubation. EXAM: PORTABLE CHEST 1 VIEW COMPARISON:  01/20/2018 chest radiograph FINDINGS: Stable cardiomegaly given projection and technique. Interstitial and alveolar pulmonary edema. Probable small effusions. Endotracheal tube tip 4.5 cm above the carina. Transcutaneous pacing pads noted. Bones are unremarkable. IMPRESSION: 1. Endotracheal tube tip 4.5 cm above the carina. 2. Stable cardiomegaly and pulmonary edema. Probable small effusions. Electronically Signed   By: Kristine Garbe M.D.   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 Stable cardiomegaly. Nonaneurysmal thoracic aorta. Diffuse bilateral airspace disease and vascular congestion consistent with pulmonary edema. No significant effusion or pneumothorax. No acute osseous abnormality. IMPRESSION: Stable cardiomegaly with pulmonary edema. Superimposed pneumonia is not entirely excluded but believed less likely. Electronically Signed   By: Ashley Royalty M.D.   On: 01/20/2018 02:56   Dg Addison Bailey G Tube Plc W/fl W/rad  Result Date: 01/20/2018 CLINICAL DATA:  Patient presents for Dobbhoff tube placement. EXAM: NASO G TUBE PLACEMENT WITH FL AND WITH RAD FLUOROSCOPY TIME:  Fluoroscopy Time:  0.3 minute Radiation Exposure Index (if provided by the fluoroscopic device): 1.3 mGy Number of Acquired Spot Images: 0 COMPARISON:  None. FINDINGS: Multiple attempts at placing a Dobbhoff tube were made through the right and left nare. The tip of the Dobbhoff tube would not progressed beyond the level of the mid trachea on repeated attempts. Attempts were made at advancing the Dobbhoff tube following deflating the tracheostomy cuff, but the Dobbhoff  tube could not advance any further. Examination was terminated at this time. IMPRESSION: 1. Multiple unsuccessful attempts at placing a Dobbhoff tube which would not progressed beyond the level of the mid trachea Electronically Signed   By: Kathreen Devoid   On: 01/20/2018 16:41    Consults: Treatment Team:  Teodoro Spray, MD Leotis Pain, MD Anthonette Legato, MD Carloyn Manner, MD Efrain Sella, MD Jonathon Bellows, MD   Subjective:    Overnight Issues: patient was unable to get tracheostomy performed yesterday secondary to desaturation and increased secretions. Also unable to pass nasogastric tube,  Objective:  Vital signs  for last 24 hours: Temp:  [98.1 F (36.7 C)-99.5 F (37.5 C)] 99.1 F (37.3 C) (08/02 0700) Pulse Rate:  [92-110] 94 (08/02 0700) Resp:  [17-31] 26 (08/02 0700) BP: (138-173)/(76-102) 157/87 (08/02 0700) SpO2:  [90 %-100 %] 96 % (08/02 0700) FiO2 (%):  [28 %-45 %] 28 % (08/02 0746) Weight:  [259 lb 14.8 oz (117.9 kg)-266 lb 8.6 oz (120.9 kg)] 259 lb 14.8 oz (117.9 kg) (08/02 0500)  Hemodynamic parameters for last 24 hours:    Intake/Output from previous day: 08/01 0701 - 08/02 0700 In: 1045.2 [I.V.:755.2; IV Piggyback:290] Out: 1859 [Urine:350]  Intake/Output this shift: No intake/output data recorded.  Vent settings for last 24 hours: Vent Mode: PRVC FiO2 (%):  [28 %-45 %] 28 % Set Rate:  [25 bmp] 25 bmp Vt Set:  [500 mL] 500 mL PEEP:  [5 cmH20] 5 cmH20 Plateau Pressure:  [16 cmH20-22 cmH20] 18 cmH20  Physical Exam:  Vital signs: . Please see the above listed vital signs HEENT: Patient is orally intubated, trachea is midline, no accessory muscle utilization Cardiovascular: Regular rate and rhythm Pulmonary: Coarse rhonchi Abdominal: Hypoactive bowel sounds, soft exam, or tics on in place Extremities: No clubbing cyanosis or edema noted Neurologic: Patient with no pupillary response, no corneals noted, no gag appreciated and unresponsive.  Only myoclonic jerking noted  Assessment/Plan:   Status post cardiac arrest.  Status post targeted temperature management. No significant improvement in neurologic status, anoxic brain injury Appreciate neurology. Pending tracheostomy, PEG tube placement.Tentatively scheduled for early next week.  History of severe cardiomyopathy with systolic failure. Measured ejection fraction of 15% on echocardiogram yesterday  Renal failure. Presently on CRRT greatly appreciate nephrology's assistance  Leukocytosis. Patient on cefepime and vancomycin, so far cultures are negative. Will DC vancomycin and complete a seven-day course of cefepime. Cultures negative thus far  Anemia. No evidence of active bleeding  Goiter. Making the tracheostomy more challenging along with difficulty in passing nasogastric tube, will try to do under IR today if they feel they are able to be successful and otherwise will start TPN   Critical care time 35 minutes   Leah Thornberry 02/03/2018  *Care during the described time interval was provided by me and/or other providers on the critical care team.  I have reviewed this patient's available data, including medical history, events of note, physical examination and test results as part of my evaluation. Patient ID: MC BLOODWORTH, male   DOB: [birth year], 58 y.o.   MRN: [id number]  Patient ID: AZAREL BANNER, male   DOB: Jan 26, 1961, 57 y.o.   MRN: [id number] Patient ID: SHERYL TOWELL, male   DOB: [birth year], 57 y.o.   MRN: [id number] Patient ID: FINAS DELONE, male   DOB: January 15, 1961, 57 y.o.   MRN: [id number]

## 2018-02-03 NOTE — Progress Notes (Signed)
[facility] [place] NAME: [name]    MR#:  [id number]  DATE OF BIRTH:  08/02/1960  SUBJECTIVE:   No other acute events overnight.  Remains on a low-dose propofol drip.  Still having some myoclonic jerking movements.  Plan for IR guided gastrostomy tube placement today.  REVIEW OF SYSTEMS:    Review of Systems  Unable to perform ROS: Intubated    Nutrition: NPO Tolerating Diet: No  DRUG ALLERGIES:   Allergies  Allergen Reactions  . Enalapril Maleate Anaphylaxis  . Iodinated Diagnostic Agents Itching  . Isosorbide Mononitrate Er [Isosorbide Dinitrate] Other (See Comments)    "made me not be able to think or function"  . Canagliflozin Other (See Comments)    VITALS:  Blood pressure (!) 162/91, pulse (!) 102, temperature 98.8 F (37.1 C), temperature source Bladder, resp. rate (!) 27, height 6\' 1"  (1.854 m), weight 117.9 kg (259 lb 14.8 oz), SpO2 100 %.  PHYSICAL EXAMINATION:   Physical Exam  GENERAL:  57 y.o.-year-old patient lying in bed intubated & Sedated.  EYES: PERRL but minimally responsive.  HEENT: Head atraumatic, normocephalic. ET and OG tubes in place.   NECK:  Supple, no jugular venous distention. No thyroid enlargement, no tenderness.  LUNGS: Normal breath sounds bilaterally, no wheezing, rales, rhonchi. No use of accessory muscles of respiration.  CARDIOVASCULAR: S1, S2 normal. No murmurs, rubs, or gallops.  ABDOMEN: Soft, nontender, nondistended. Bowel sounds present. No organomegaly or mass.  EXTREMITIES: No cyanosis, clubbing or edema b/l.    NEUROLOGIC: Sedated & Intubated  PSYCHIATRIC: Sedated & Intubated.   SKIN: No obvious rash, lesion, or ulcer.    LABORATORY PANEL:   CBC Recent Labs  Lab 03/01/2018 0432  WBC 19.4*  HGB 8.9*  HCT 26.4*  PLT 111*   ------------------------------------------------------------------------------------------------------------------  Chemistries  Recent Labs   Lab 01/28/18 0433  02/20/2018 0432 02/03/18 0509  NA 141   < > 137 140  K 4.7   < > 4.9 4.5  CL 105   < > 104 105  CO2 27   < > 26 29  GLUCOSE 333*   < > 105* 93  BUN 23*   < > 39* 51*  CREATININE 1.77*   < > 2.56* 3.43*  CALCIUM 9.1   < > 8.4* 8.0*  MG  --    < > 2.2  --   AST 59*  --   --   --   ALT 36  --   --   --   ALKPHOS 54  --   --   --   BILITOT 1.0  --   --   --    < > = values in this interval not displayed.   ------------------------------------------------------------------------------------------------------------------  Cardiac Enzymes Recent Labs  Lab 01/29/18 0210  TROPONINI 1.35*   ------------------------------------------------------------------------------------------------------------------  RADIOLOGY:  Ct Abdomen Wo Contrast  Result Date: 02/03/2018 CLINICAL DATA:  Evaluate anatomy prior to potential percutaneous gastrostomy tube placement. EXAM: CT ABDOMEN WITHOUT CONTRAST TECHNIQUE: Multidetector CT imaging of the abdomen was performed following the standard protocol without IV contrast. COMPARISON:  Abdominal radiographs-01/28/2018; chest radiograph-earlier same day FINDINGS: Lower chest: Limited visualization of the lower thorax demonstrates small/trace bilateral pleural effusions, right greater than left. Consolidative opacities are seen within the bilateral lung bases with associated air bronchograms, right greater than left. Cardiomegaly. Coronary artery calcifications. Diffuse decreased attenuation intra cardiac blood pool suggestive of anemia. Tip of dialysis catheter terminates within  the distal SVC. Hepatobiliary: Hepatomegaly. There is mild nodularity of the hepatic contour. Layering debris is seen within otherwise normal-appearing gallbladder, similar to the 02/2009 examination and likely indicative of biliary sludge. No ascites. Pancreas: Normal noncontrast appearance of the pancreas Spleen: Interval development of a dystrophic calcification within  a ill-defined hypoattenuating splenic lesion which appears otherwise unchanged compared to the 2010 examination and favored to represent a minimally complex splenic cyst. Adrenals/Urinary Tract: Minimal amount of bilateral perinephric stranding. No urinary obstruction. No renal stones. Normal noncontrast appearance the bilateral adrenal glands. Stomach/Bowel: The anterior wall the stomach is well apposed against the ventral wall of the abdomen without interposed liver or transverse colon. No hiatal hernia. No evidence of enteric obstruction. No pneumoperitoneum, pneumatosis or portal venous gas. Vascular/Lymphatic: Calcified atherosclerotic plaque within normal caliber abdominal aorta. No bulky retroperitoneal or mesenteric lymphadenopathy on this noncontrast examination. Other: Diffuse body wall anasarca. Scattered foci of subcutaneous emphysema within the abdominal pannus likely at the location of subcutaneous medication administration Musculoskeletal: No acute or aggressive osseous abnormalities. IMPRESSION: 1. Gastric anatomy amenable to potential percutaneous gastrostomy tube placement as indicated. 2. Trace/small bilateral effusions with bibasilar consolidative opacities with associated air bronchograms, right greater than left, worrisome for multifocal infection/aspiration. 3. Suspected cholelithiasis. 4.  Aortic Atherosclerosis (ICD10-I70.0). Electronically Signed   By: Sandi Mariscal M.D.   On: 02/03/2018 14:09   Ir Gastrostomy Tube Mod Sed  Result Date: 02/03/2018 INDICATION: Dysphagia secondary to cardiac arrest and anoxic brain injury. Please perform percutaneous gastrostomy tube placement for enteric nutrition supplementation purposes. EXAM: PULL TROUGH GASTROSTOMY TUBE PLACEMENT COMPARISON:  Noncontrast abdominal CT - earlier same day MEDICATIONS: The patient is currently admitted to hospital receiving intravenous antibiotics; Antibiotics were administered within 1 hour of the procedure. Glucagon 1 mg  IV CONTRAST:  20 mL of Isovue 300 administered into the gastric lumen. ANESTHESIA/SEDATION: None - patient is on continuous propofol drip FLUOROSCOPY TIME:  1 minutes 12 seconds (40 mGy) COMPLICATIONS: None immediate. PROCEDURE: Informed written consent was obtained from the patient's family following explanation of the procedure, risks, benefits and alternatives. A time out was performed prior to the initiation of the procedure. Ultrasound scanning was performed to demarcate the edge of the left lobe of the liver. Maximal barrier sterile technique utilized including caps, mask, sterile gowns, sterile gloves, large sterile drape, hand hygiene and Betadine prep. The left upper quadrant was sterilely prepped and draped. An oral gastric catheter was inserted into the stomach under fluoroscopy. The existing nasogastric feeding tube was removed. The left costal margin and air opacified transverse colon were identified and avoided. Air was injected into the stomach for insufflation and visualization under fluoroscopy. Under sterile conditions a 17 gauge trocar needle was utilized to access the stomach percutaneously beneath the left subcostal margin after the overlying soft tissues were anesthetized with 1% Lidocaine with epinephrine. Needle position was confirmed within the stomach with aspiration of air and injection of small amount of contrast. A single T tack was deployed for gastropexy. Over an Amplatz guide wire, a 9-French sheath was inserted into the stomach. A snare device was utilized to capture the oral gastric catheter. The snare device was pulled retrograde from the stomach up the esophagus and out the oropharynx. The 20-French pull-through gastrostomy was connected to the snare device and pulled antegrade through the oropharynx down the esophagus into the stomach and then through the percutaneous tract external to the patient. The gastrostomy was assembled externally. Contrast injection confirms position in  the stomach.  Several spot radiographic images were obtained in various obliquities for documentation. The patient tolerated procedure well without immediate post procedural complication. FINDINGS: After successful fluoroscopic guided placement, the gastrostomy tube is appropriately positioned with internal disc against the ventral aspect of the gastric lumen. IMPRESSION: Successful fluoroscopic insertion of a 20-French pull-through gastrostomy tube. The gastrostomy may be used immediately for medication administration and in 24 hrs for the initiation of feeds. Electronically Signed   By: Sandi Mariscal M.D.   On: 02/03/2018 14:24   Dg Chest Port 1 View  Result Date: 02/03/2018 CLINICAL DATA:  Ventilation. EXAM: PORTABLE CHEST 1 VIEW COMPARISON:  Radiograph of February 01, 2018. FINDINGS: Endotracheal tube is in good position. Right internal jugular catheter is unchanged. Stable cardiomegaly with central pulmonary vascular congestion is noted. Mild bibasilar edema or atelectasis is noted with small right pleural effusion. No pneumothorax is noted. Bony thorax is unremarkable. IMPRESSION: Stable cardiomegaly with central pulmonary vascular congestion. Endotracheal tube in good position. Mild bibasilar subsegmental atelectasis or edema is noted with small right pleural effusion. Electronically Signed   By: Marijo Conception, M.D.   On: 02/03/2018 10:19     ASSESSMENT AND PLAN:   57 year old male with past medical history of ischemic cardiomyopathy ejection fraction of 20 to 24%, chronic systolic CHF, Obstructive sleep apnea, hypertension, diabetes who presented to the hospital as he was found unresponsive and noted to be in pulseless cardiac arrest.  1.  Pulseless cardiac arrest-as a result of worsening respiratory failure and flash pulmonary edema. - Patient was resuscitated in the ER for about 17 minutes prior to regaining spontaneous circulation. - No further arrhythmias but prognosis is quite poor but the  patient's wife wants to continue aggressive care.  2.  Respiratory failure-patient was intubated after his cardiac arrest.  Not showing any signs of meaningful neurological recovery.  Family wants to continue aggressive care.  -Patient could not tolerate tracheostomy yesterday and plan for trach next week. -Patient has increasing secretions and thought to have pneumonia suspected aspiration.  -Patient has been switched over from IV cefepime to meropenem. Continue pulmonary toileting as per intensivist.  3.  Acute kidney injury with oliguria-secondary to underlying cardiac arrest and cardiorenal hemodynamics. -Not improved with fluids and Levophed.  - Urine output still remains quite poor.  Neurology following and continue intermittent HD for now.  Patient is off CRRT.  4.  Altered mental status/encephalopathy-suspected to be secondary to underlying anoxic brain injury. - Neurology was consulted, they have explained to the patient's wife about patient's poor prognosis and unlikely that he would recover from this.   -As per neurology patient does not meet fulfill criteria for brain death.  Family and wife are hopeful. - Continue current supportive care.  - repeat CT head showing global edema, EEG showing generalized slowing possibly consistent with underlying anoxic brain injury.   Prognosis is quite poor.    5.  Sepsis/septic shock- thought to have possible aspiration pneumonia.   Continue empiric Meropenem. - pt. Off vasopressors now.  Cultures remain (-).     6.  History of ischemic cardiomyopathy with ejection fraction of 20 to 25%- patient does not appear to be volume overloaded presently.   7.  Leukocytosis-secondary to suspected sepsis.  Follow with IV antibiotic therapy and it's improving.  S/p IR guided PEG placement today.   Patient has a very poor prognosis given his multiple comorbidities and now with multiorgan failure and likely anoxic brain injury.  Family and wife want to  cont. Aggressive care.     All the records are reviewed and case discussed with Care Management/Social Worker. Management plans discussed with the patient, family and they are in agreement.  CODE STATUS: Full code  DVT Prophylaxis: Hep SQ  TOTAL TIME TAKING CARE OF THIS PATIENT: 30 minutes.   POSSIBLE D/C IN unclear, DEPENDING ON CLINICAL CONDITION and course   Henreitta Leber M.D on 02/03/2018 at 3:44 PM  Between 7am to 6pm - Pager - [phone]  After 6pm go to [email or web address] - password [email or web address] [facility]  Office  [phone]  CC: Primary care physician; Marguerita Merles, MD

## 2018-02-03 NOTE — Progress Notes (Signed)
[facility]  ROUNDING NOTE   Subjective:  Patient seen at bedside. Remains critically ill. Still on the vent. Tracheostomy was not performed yesterday as patient had oxygen desaturation. Patient also completed dialysis yesterday.   Objective:  Vital signs in last 24 hours:  Temp:  [98.1 F (36.7 C)-99.5 F (37.5 C)] 99.1 F (37.3 C) (08/02 0800) Pulse Rate:  [92-110] 98 (08/02 0800) Resp:  [17-30] 26 (08/02 0800) BP: (138-173)/(76-102) 161/88 (08/02 0800) SpO2:  [90 %-100 %] 96 % (08/02 0800) FiO2 (%):  [28 %-45 %] 28 % (08/02 0746) Weight:  [117.9 kg (259 lb 14.8 oz)-120.9 kg (266 lb 8.6 oz)] 117.9 kg (259 lb 14.8 oz) (08/02 0500)  Weight change: -0.1 kg (-3.5 oz) Filed Weights   02/22/2018 1145 02/13/2018 1530 02/03/18 0500  Weight: 120.9 kg (266 lb 8.6 oz) 118.7 kg (261 lb 11 oz) 117.9 kg (259 lb 14.8 oz)    Intake/Output: I/O last 3 completed shifts: In: 1679.6 [I.V.:1039.6; IV Piggyback:640] Out: 2515 [Urine:475; Other:2040]   Intake/Output this shift:  No intake/output data recorded.  Physical Exam: General: Critically ill appearing  Head: Normocephalic, atraumatic. Moist oral mucosal membranes  Eyes: Anicteric  Neck: Supple, trachea midline  Lungs:  Clear to auscultation, vent assisted  Heart: S1S2 no rubs  Abdomen:  Soft, nontender, bowel sounds present  Extremities: 1+ peripheral edema.  Neurologic: Comatose  Skin: No lesions  Access: Right femoral dialysis catheter    Basic Metabolic Panel: Recent Labs  Lab 02/01/18 1218 02/01/18 1645 02/01/18 2021 02/27/2018 0012 03/02/2018 0432 02/03/2018 1234 02/03/18 0509  NA 136 137  --  136 137  --  140  K 4.5 4.8  --  5.0 4.9  --  4.5  CL 102 104  --  103 104  --  105  CO2 25 25  --  27 26  --  29  GLUCOSE 119* 115*  --  140* 105*  --  93  BUN 41* 39*  --  39* 39*  --  51*  CREATININE 2.63* 2.64*  --  2.39* 2.56*  --  3.43*  CALCIUM 8.2* 8.3*  --  8.2* 8.4*  --  8.0*  MG 2.2 2.3 2.3 2.3 2.2   --   --   PHOS 5.1* 4.7*  --  4.4 4.1 5.4*  --     Liver Function Tests: Recent Labs  Lab 01/28/18 0034 01/28/18 0433  02/01/18 0812 02/01/18 1218 02/01/18 1645 02/07/2018 0012 02/17/2018 0432  AST 58* 59*  --   --   --   --   --   --   ALT 31 36  --   --   --   --   --   --   ALKPHOS 49 54  --   --   --   --   --   --   BILITOT 0.5 1.0  --   --   --   --   --   --   PROT 5.8* 6.7  --   --   --   --   --   --   ALBUMIN 2.2* 2.6*   < > 1.8* 2.0* 2.1* 2.0* 2.1*   < > = values in this interval not displayed.   No results for input(s): LIPASE, AMYLASE in the last 168 hours. No results for input(s): AMMONIA in the last 168 hours.  CBC: Recent Labs  Lab 01/28/18 0034 01/28/18 1610 01/29/18 0210 02/01/18 9604 02/10/2018 5409  WBC 22.5* 33.8* 25.9* 23.8* 19.4*  NEUTROABS  --   --   --  19.7* 14.9*  HGB 10.3* 11.9* 10.3* 8.9* 8.9*  HCT 30.9* 34.5* 30.1* 26.3* 26.4*  MCV 96.2 93.5 92.2 91.6 91.9  PLT 288 268 113* 100* 111*    Cardiac Enzymes: Recent Labs  Lab 01/28/18 0433 01/28/18 0816 01/28/18 1440 01/28/18 2151 01/29/18 0210 01/30/18 0006  CKTOTAL  --   --   --   --   --  385  TROPONINI 0.50* 0.90* 1.17* 1.36* 1.35*  --     BNP: Invalid input(s): POCBNP  CBG: Recent Labs  Lab 03/04/2018 1523 02/11/2018 2011 02/03/2018 2337 02/03/18 0346 02/03/18 0800  GLUCAP 103* 126* 12* 41 108*    Microbiology: Results for orders placed or performed during the hospital encounter of 01/26/2018  MRSA PCR Screening     Status: None   Collection Time: 01/28/18  5:00 AM  Result Value Ref Range Status   MRSA by PCR NEGATIVE NEGATIVE Final    Comment:        The GeneXpert MRSA Assay (FDA approved for NASAL specimens only), is one component of a comprehensive MRSA colonization surveillance program. It is not intended to diagnose MRSA infection nor to guide or monitor treatment for MRSA infections. Performed at [facility], [address] [place], [place]  [address]   Urine Culture     Status: None   Collection Time: 01/28/18  6:16 AM  Result Value Ref Range Status   Specimen Description   Final    URINE, RANDOM Performed at [facility], [address], [place], [place] [address]    Special Requests   Final    NONE Performed at [facility], [address] [place], [place] [address]    Culture   Final    NO GROWTH Performed at [facility], [place] [address], [place], [place] [address]    Report Status 01/29/2018 FINAL  Final  Culture, blood (Routine X 2) w Reflex to ID Panel     Status: None   Collection Time: 01/28/18  6:45 AM  Result Value Ref Range Status   Specimen Description BLOOD LAC  Final   Special Requests   Final    BOTTLES DRAWN AEROBIC AND ANAEROBIC Blood Culture adequate volume   Culture   Final    NO GROWTH 5 DAYS Performed at [facility], [address] [place], [place] [address]    Report Status 02/24/2018 FINAL  Final  Culture, blood (Routine X 2) w Reflex to ID Panel     Status: None   Collection Time: 01/28/18  6:56 AM  Result Value Ref Range Status   Specimen Description BLOOD RAC  Final   Special Requests   Final    BOTTLES DRAWN AEROBIC AND ANAEROBIC Blood Culture results may not be optimal due to an inadequate volume of blood received in culture bottles   Culture   Final    NO GROWTH 5 DAYS Performed at [facility], [address] [place], [place] [address]    Report Status 02/23/2018 FINAL  Final  Culture, respiratory (non-expectorated)     Status: None   Collection Time: 01/28/18 11:24 AM  Result Value Ref Range Status   Specimen Description   Final    TRACHEAL ASPIRATE Performed at [facility], [address] [place], [place] [address]    Special Requests   Final    NONE Performed at  [place], [place] [address]    Gram Stain   Final    RARE WBC PRESENT,BOTH PMN AND MONONUCLEAR RARE GRAM  POSITIVE COCCI IN PAIRS    Culture   Final    RARE Consistent with normal respiratory flora. Performed at [facility], [place] [address] [place], [place] [address]    Report Status 01/30/2018 FINAL  Final    Coagulation Studies: No results for input(s): LABPROT, INR in the last 72 hours.  Urinalysis: No results for input(s): COLORURINE, LABSPEC, PHURINE, GLUCOSEU, HGBUR, BILIRUBINUR, KETONESUR, PROTEINUR, UROBILINOGEN, NITRITE, LEUKOCYTESUR in the last 72 hours.  Invalid input(s): APPERANCEUR    Imaging: Dg Chest Port 1 View  Result Date: 02/01/2018 CLINICAL DATA:  Cardiac and respiratory arrest.  Post intubation. EXAM: PORTABLE CHEST 1 VIEW COMPARISON:  01/31/2018 FINDINGS: Endotracheal tube 6.9 cm above the carina. Advancement with 3-4 cm may be considered. Central lines in stable position. Enlarged globular heart.  Mediastinal contours appear intact. Bilateral pleural effusions and interstitial pulmonary edema. More focal airspace consolidation in the right lung base cannot be excluded. Osseous structures are without acute abnormality. Soft tissues are grossly normal. IMPRESSION: Endotracheal tube 6.9 cm above the carina. Advancement with 3-4 cm may be considered. Enlarged globular heart. This may be due to cardiomegaly or pericardial effusion. Interstitial pulmonary edema with moderate in size bilateral pleural effusions. More focal airspace consolidation in the right lung base cannot be excluded. Electronically Signed   By: Fidela Salisbury M.D.   On: 02/01/2018 09:36     Medications:   . sodium chloride Stopped (02/26/2018 1838)  . sodium chloride 10 mL/hr at 02/19/2018 0207  . ceFEPime (MAXIPIME) IV Stopped (02/03/18 1950)  . famotidine (PEPCID) IV Stopped (02/03/2018 2300)  . propofol (DIPRIVAN) infusion 40 mcg/kg/min (02/03/18 0900)   . chlorhexidine gluconate (MEDLINE KIT)  15 mL Mouth Rinse BID  . heparin injection (subcutaneous)  5,000 Units Subcutaneous Q8H  .  insulin aspart  0-20 Units Subcutaneous Q4H  . insulin glargine  5 Units Subcutaneous QHS  . ipratropium-albuterol  3 mL Nebulization Q6H  . mouth rinse  15 mL Mouth Rinse 10 times per day  . metoprolol tartrate  5 mg Intravenous Q6H  . sodium chloride flush  10-40 mL Intracatheter Q12H   sodium chloride, acetaminophen **OR** acetaminophen, fentaNYL, heparin, hydrALAZINE, sodium chloride flush  Assessment/ Plan:  57 y.o. male  with a PMHx of diabetes mellitus type 2, hypertension, obstructive sleep apnea, who was admitted to [facility] on 01/19/2018 for evaluation of cardiac arrest that occurred at home.   1.  Acute renal failure, oligoanuric. 2.  Metabolic acidosis improved with CRRT 3.  Acute respiratory failure, on the ventilator.  4.  Cardiac arrest, completed hypothermia protocol. 5.  Anemia unspecified.  Plan: Patient seen at bedside.  He completed hemodialysis yesterday.  No urgent indication for dialysis today.  We will plan for intermittent hemodialysis tomorrow at the bedside.  He remains on ventilatory support at this time.  No significant change in neurologic status at this time.  We will continue to follow the patient closely.    LOS: 6 Shaquavia Whisonant 8/2/20199:14 AM

## 2018-02-03 NOTE — OR Nursing (Signed)
Dr Pascal Lux aware of contrast allergy contrast dye causes itching. No pre med needed.

## 2018-02-03 NOTE — Progress Notes (Signed)
Patient ID: [name], male   DOB: 02-Jun-1961, 57 y.o.   MRN: [id number] Pulmonary/critical care  Procedure note Bronchoscopy Indications: Copious secretions when lying patient flat with occasional plugging Consent is obtained and on chart Performed through an adapter attached to endotracheal tube Proctoscope inserted through the adapter an endotracheal tube 1 cm above the carina. We'll hold back 2 cm proximally post procedure Minimal secretions were noted in the right and left mainstem bronchus. Full inspection performed on the right. Right upper lobe, bronchus intermedius, right middle lobe, right lower lobe, mild secretions noted were cleared without difficulty they did not appear to be mucopurulent Bronchoscope inserted into the left mainstem bronchus. Some secretions were noted at the left mainstem orifice. Full inspection of the left mainstem bronchus, left upper lobe and left lower lobe was performed without significant amount of secretions noted. Patient tolerated procedure well. Will send any specimen for respiratory culture  Hermelinda Dellen, D.O.

## 2018-02-03 NOTE — Progress Notes (Signed)
Pharmacy Antibiotic Note  [name] is a 57 y.o. male admitted on 01/26/2018 s/p cardiac arrest and underwent targeted temperature management. Patient requiring mechanical ventilation; now off of CRRT. Pharmacy has been consulted for meropenem dosing for sepsis. Patient with recent history requiring mechanical ventilation and discharged on 7/23 - patient received both vancomycin and cefepime during admission.   Plan: Patient with increased work of breathing and secretions; empirically changed to meropenem 1g IV Q8hr.    Height: 6\' 1"  (185.4 cm) Weight: 259 lb 14.8 oz (117.9 kg) IBW/kg (Calculated) : 79.9  Temp (24hrs), Avg:99.1 F (37.3 C), Min:98.1 F (36.7 C), Max:99.5 F (37.5 C)  Recent Labs  Lab 01/28/18 0034 01/28/18 0430 01/28/18 0432  01/29/18 0210  01/30/18 0355  01/30/18 1036  01/30/18 1616  02/01/18 0446  02/01/18 1218 02/01/18 1645 02/01/18 2021 02/14/2018 0012 02/26/2018 0432 02/03/18 0509  WBC 22.5*  --  33.8*  --  25.9*  --   --   --   --   --   --   --  23.8*  --   --   --   --   --  19.4*  --   CREATININE 1.47*  --   --    < > 2.39*   < > 4.05*   < >  --    < >  --    < > 2.87*   < > 2.63* 2.64*  --  2.39* 2.56* 3.43*  LATICACIDVEN 8.7* 2.3*  --   --  3.5*  --  2.6*  --  1.8  --   --   --   --   --   --   --   --   --   --   --   VANCORANDOM  --   --   --   --   --    < >  --   --   --   --  23  --   --   --   --   --  16  --   --   --    < > = values in this interval not displayed.    Estimated Creatinine Clearance: 32 mL/min (A) (by C-G formula based on SCr of 3.43 mg/dL (H)).    Allergies  Allergen Reactions  . Enalapril Maleate Anaphylaxis  . Iodinated Diagnostic Agents Itching  . Isosorbide Mononitrate Er [Isosorbide Dinitrate] Other (See Comments)    "made me not be able to think or function"  . Canagliflozin Other (See Comments)    Antimicrobials this admission: Cefepime 7/19 >> 7/23, 7/27 >> 8/2 Vancomycin 7/19 >> 7/22, 7/27 >>  7/31 Meropenem 8/2 >>  Dose adjustments this admission: 7/29 vancomycin held due to vancomycin level of 32 7/29 CRRT initiated. Patients cefepime and vancomycin adjusted  Microbiology results: 7/27 BCx: no growth x 5 days  7/27 UCx: no growth  7/27 Sputum: normal flora   7/27 MRSA PCR: negative  8/2 Tracheal Aspirate: few gram negative rods  Thank you for allowing pharmacy to be a part of this patient's care.  Lynnsey Barbara L 02/03/2018 6:14 PM

## 2018-02-03 NOTE — Progress Notes (Signed)
He is being planned for G tube by IR  I will sign off.  Please call me if any further GI concerns or questions.  We would like to thank you for the opportunity to participate in the care of [employer].

## 2018-02-03 NOTE — Consult Note (Signed)
Chief Complaint: Dysphagia  Referring Physician(s): Conforti,Adalai Perl  Patient Status: [facility] - In-pt  History of Present Illness: [name] is a 57 y.o. male with past history significant for diabetes, hypertension and sleep apnea who is been admitted to the hospital following cardiac and respiratory arrest post anoxic brain injury.  As such, request made for placement of percutaneous gastrostomy tube for enteric nutrition supplementation purposes.  Patient remains admitted to the ICU on continuous propofol drip.       Past Medical History:  Diagnosis Date  . Chest pain 2019  . Diabetes mellitus without complication ([place])   . Hypertension   . Sleep apnea     Past Surgical History:  Procedure Laterality Date  . COLONOSCOPY  2017  . LIPOMA EXCISION     Left forehead    Allergies: Enalapril maleate; Iodinated diagnostic agents; Isosorbide mononitrate er [isosorbide dinitrate]; and Canagliflozin  Medications: Prior to Admission medications   Medication Sig Start Date End Date Taking? Authorizing Provider  ALPRAZolam Duanne Moron) 0.5 MG tablet Take 0.5 mg by mouth 2 (two) times daily as needed for anxiety. 01/26/18  Yes [provider]  aspirin EC 81 MG tablet Take 81 mg by mouth daily.   Yes [provider]  furosemide (LASIX) 20 MG tablet Take 1 tablet (20 mg total) by mouth daily. 01/25/18  Yes Salary, [name], MD  hydrALAZINE (APRESOLINE) 25 MG tablet Take 1 tablet (25 mg total) by mouth every 8 (eight) hours. 01/24/18  Yes Salary, [name], MD  metoprolol tartrate (LOPRESSOR) 50 MG tablet Take 1 tablet (50 mg total) by mouth 2 (two) times daily. 01/24/18  Yes Salary, [name], MD  HYDROcodone-acetaminophen (NORCO/VICODIN) 5-325 MG tablet Take 1 tablet by mouth every 4 (four) hours as needed. Patient not taking: Reported on 01/20/2018 12/13/17   Harvest Dark, MD  polyethylene glycol powder (GLYCOLAX/MIRALAX) powder Take 17 g by mouth daily as  needed for moderate constipation. Patient not taking: Reported on 01/20/2018 12/13/17   Harvest Dark, MD     Family History  Problem Relation Age of Onset  . Diabetes Father   . Colon cancer Neg Hx     Social History   Socioeconomic History  . Marital status: Married    Spouse name: Not on file  . Number of children: Not on file  . Years of education: Not on file  . Highest education level: Not on file  Occupational History  . Not on file  Social Needs  . Financial resource strain: Not on file  . Food insecurity:    Worry: Not on file    Inability: Not on file  . Transportation needs:    Medical: Not on file    Non-medical: Not on file  Tobacco Use  . Smoking status: Current Every Day Smoker    Packs/day: 0.30    Years: 32.00    Pack years: 9.60    Types: Cigarettes  . Smokeless tobacco: Never Used  Substance and Sexual Activity  . Alcohol use: No  . Drug use: Never  . Sexual activity: Not Currently  Lifestyle  . Physical activity:    Days per week: Not on file    Minutes per session: Not on file  . Stress: Not on file  Relationships  . Social connections:    Talks on phone: Not on file    Gets together: Not on file    Attends religious service: Not on file    Active member of club  or organization: Not on file    Attends meetings of clubs or organizations: Not on file    Relationship status: Not on file  Other Topics Concern  . Not on file  Social History Narrative  . Not on file    ECOG Status: 4 - Bedbound  Review of Systems: A 12 point ROS discussed and pertinent positives are indicated in the HPI above.  All other systems are negative.  Review of Systems  Vital Signs: BP (!) 162/91 (BP Location: Left Arm)   Pulse (!) 102   Temp 98.8 F (37.1 C) (Bladder)   Resp (!) 27   Ht 6\' 1"  (1.854 m)   Wt 259 lb 14.8 oz (117.9 kg)   SpO2 97%   BMI 34.29 kg/m   Physical Exam  Constitutional: He appears well-developed and well-nourished.    Pulmonary/Chest:  Intubated  Psychiatric:  Sedated  Nursing note and vitals reviewed.   Imaging: Dg Abd 1 View  Result Date: 01/28/2018 CLINICAL DATA:  NG tube placement EXAM: ABDOMEN - 1 VIEW COMPARISON:  01/20/2018 FINDINGS: No enteric tube is demonstrated within the field of view. Mild gaseous distention of the stomach. IMPRESSION: No enteric tube visualized. Electronically Signed   By: Lucienne Capers M.D.   On: 01/28/2018 05:53   Dg Abd 1 View  Result Date: 01/20/2018 CLINICAL DATA:  Encounter for NG tube placement EXAM: ABDOMEN - 1 VIEW COMPARISON:  01/20/2018 FINDINGS: Malpositioned nasogastric tube, coiled backwards overlying the mid esophagus with tip excluded from view Endotracheal tube overlies the tracheal air column with tip superior to the carina. No pneumothorax. Pacer leads overlie the left ventricular apex. Pulmonary vasculature remains indistinct with cephalization of flow. Suspected trace right-sided pleural effusion. No definite left-sided pleural effusion, though the left costophrenic angle is excluded from view. Nonobstructive bowel gas pattern. No pneumoperitoneum, pneumatosis or portal venous gas. IMPRESSION: 1. Malpositioned nasogastric tube.  Repositioning is advised. 2. Similar findings of pulmonary edema and trace right-sided pleural effusion. 3. Nonobstructive bowel gas pattern. Electronically Signed   By: Sandi Mariscal M.D.   On: 01/20/2018 14:03   Dg Abd 1 View  Result Date: 01/20/2018 CLINICAL DATA:  NG tube placement EXAM: ABDOMEN - 1 VIEW COMPARISON:  None. FINDINGS: There is no nasogastric tube identified in the lower chest or abdomen. There is mild gaseous distension of the colon and stomach. There is no evidence of pneumoperitoneum, portal venous gas or pneumatosis. There are no pathologic calcifications along the expected course of the ureters. The osseous structures are unremarkable. IMPRESSION: No nasogastric tube identified in the lower chest or abdomen.  Electronically Signed   By: Kathreen Devoid   On: 01/20/2018 12:42   Dg Abd 1 View  Result Date: 01/20/2018 CLINICAL DATA:  Abdominal pain. EXAM: ABDOMEN - 1 VIEW COMPARISON:  None. FINDINGS: A right-sided pleural effusion is suspected layering posteriorly. Pacer paddles are noted on the chest. The bowel gas pattern is unremarkable. Air and stool scattered throughout the colon. A few scattered air-filled small bowel loops. No obvious free air. A right femoral line is noted. Bony structures are unremarkable. IMPRESSION: Suspect right pleural effusion. No plain film findings for an acute abdominal process. Electronically Signed   By: Marijo Sanes M.D.   On: 01/20/2018 11:22   Ct Head Wo Contrast  Result Date: 01/30/2018 CLINICAL DATA:  Anoxic brain injury EXAM: CT HEAD WITHOUT CONTRAST TECHNIQUE: Contiguous axial images were obtained from the base of the skull through the vertex without intravenous contrast.  COMPARISON:  Head CT 01/28/2018 FINDINGS: Brain: There is diffuse blurring of the gray-white interface, slightly greater than on the prior examination. There is crowding of the basal cisterns. No acute hemorrhage or other extra-axial collection. Vascular: No abnormal hyperdensity of the major intracranial arteries or dural venous sinuses. No intracranial atherosclerosis. Skull: The visualized skull base, calvarium and extracranial soft tissues are normal. Sinuses/Orbits: No fluid levels or advanced mucosal thickening of the visualized paranasal sinuses. No mastoid or middle ear effusion. The orbits are normal. IMPRESSION: Diffuse blurring of the gray-white interface, compatible with global edema, slightly progressed compared to 01/28/2018. Mild worsening of basal cisternal crowding. Electronically Signed   By: Ulyses Jarred M.D.   On: 01/30/2018 13:17   Ct Head Wo Contrast  Result Date: 01/28/2018 CLINICAL DATA:  Cardiac arrest. History of hypertension and diabetes. EXAM: CT HEAD WITHOUT CONTRAST  TECHNIQUE: Contiguous axial images were obtained from the base of the skull through the vertex without intravenous contrast. COMPARISON:  CT neck January 20, 2018. FINDINGS: BRAIN: No intraparenchymal hemorrhage, mass effect nor midline shift. Mild blurring of the gray-white matter differentiation. Patchy supratentorial white matter hypodensities. The ventricles and sulci are normal. No acute large vascular territory infarcts. No abnormal extra-axial fluid collections. Basal cisterns are patent. VASCULAR: Mild calcific atherosclerosis carotid bifurcations. SKULL/SOFT TISSUES: No skull fracture. No significant soft tissue swelling. ORBITS/SINUSES: The included ocular globes and orbital contents are normal.Mild paranasal sinus mucosal thickening without air-fluid levels. Included mastoid air cells are well aerated. OTHER: Life-support lines in place. IMPRESSION: 1. 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 2. Mild chronic small vessel ischemic changes. 3. Mild atherosclerosis. 4. Acute findings discussed with and reconfirmed by Dr.ALLISON WEBSTER on 01/28/2018 at 1:00 am. Electronically Signed   By: Elon Alas M.D.   On: 01/28/2018 01:04   Ct Soft Tissue Neck Wo Contrast  Result Date: 01/20/2018 CLINICAL DATA:  57 y/o  M; thyroid nodule. EXAM: CT NECK WITHOUT CONTRAST TECHNIQUE: Multidetector CT imaging of the neck was performed following the standard protocol without intravenous contrast. COMPARISON:  None. FINDINGS: Pharynx and larynx: Debris within the airways from intubation. Salivary glands: No inflammation, mass, or stone. Thyroid: 5.6 cm nodule within the left lobe of the thyroid. 18 mm nodule in the right lobe of thyroid. Lymph nodes: None enlarged or abnormal density. Vascular: Negative. Limited intracranial: Negative. Visualized orbits: Negative. Mastoids and visualized paranasal sinuses: Mild mucosal thickening of the left maxillary sinus. Normal aeration of the mastoid air  cells. Skeleton: No acute or aggressive process. Dental disease with multiple periapical cysts and absent crowns. Mild cervical spondylosis greatest at the C5-6 level. Upper chest: Endotracheal tube tip 3.4 cm above the carina. Moderate bilateral pleural effusions and dependent atelectasis of the lungs. Other: None. IMPRESSION: 1. Thyroid nodules measuring up to 5.6 cm in the left lobe of thyroid. Mass effect from the large left lobe of thyroid nodule displaces the airway rightward. 2. Endotracheal tube tip 3.4 cm above the carina. 3. Moderate bilateral pleural effusion with dependent atelectasis of the lungs. Electronically Signed   By: Kristine Garbe M.D.   On: 01/20/2018 04:44   Dg Chest Port 1 View  Result Date: 02/03/2018 CLINICAL DATA:  Ventilation. EXAM: PORTABLE CHEST 1 VIEW COMPARISON:  Radiograph of February 01, 2018. FINDINGS: Endotracheal tube is in good position. Right internal jugular catheter is unchanged. Stable cardiomegaly with central pulmonary vascular congestion is noted. Mild bibasilar edema or atelectasis is noted with small right pleural effusion. No pneumothorax is  noted. Bony thorax is unremarkable. IMPRESSION: Stable cardiomegaly with central pulmonary vascular congestion. Endotracheal tube in good position. Mild bibasilar subsegmental atelectasis or edema is noted with small right pleural effusion. Electronically Signed   By: Marijo Conception, M.D.   On: 02/03/2018 10:19   Dg Chest Port 1 View  Result Date: 02/01/2018 CLINICAL DATA:  Cardiac and respiratory arrest.  Post intubation. EXAM: PORTABLE CHEST 1 VIEW COMPARISON:  01/31/2018 FINDINGS: Endotracheal tube 6.9 cm above the carina. Advancement with 3-4 cm may be considered. Central lines in stable position. Enlarged globular heart.  Mediastinal contours appear intact. Bilateral pleural effusions and interstitial pulmonary edema. More focal airspace consolidation in the right lung base cannot be excluded. Osseous structures  are without acute abnormality. Soft tissues are grossly normal. IMPRESSION: Endotracheal tube 6.9 cm above the carina. Advancement with 3-4 cm may be considered. Enlarged globular heart. This may be due to cardiomegaly or pericardial effusion. Interstitial pulmonary edema with moderate in size bilateral pleural effusions. More focal airspace consolidation in the right lung base cannot be excluded. Electronically Signed   By: Fidela Salisbury M.D.   On: 02/01/2018 09:36   Dg Chest Port 1 View  Result Date: 01/31/2018 CLINICAL DATA:  Check endotracheal tube EXAM: PORTABLE CHEST 1 VIEW COMPARISON:  01/30/2018 FINDINGS: Endotracheal tube is noted at the thoracic inlet. Bilateral jugular catheters are again noted and stable. Cardiac shadow is stable but enlarged. Persistent right-sided infiltrate and effusion is seen. The left lung is clear. IMPRESSION: Right-sided effusion and infiltrate stable from the prior exam. Tubes and lines as described. Electronically Signed   By: Inez Catalina M.D.   On: 01/31/2018 10:49   Dg Chest Port 1 View  Result Date: 01/30/2018 CLINICAL DATA:  Central line placement EXAM: PORTABLE CHEST 1 VIEW COMPARISON:  01/30/2018 at 0918 hours FINDINGS: Endotracheal tube terminates 6 cm above the carina. Right IJ dual lumen catheter terminates in the lower SVC. Left IJ venous catheter terminates in the left brachiocephalic vein. Cardiomegaly with mild to moderate interstitial edema. Multifocal pneumonia is also possible. Suspected bilateral pleural effusions, right greater than left. No pneumothorax. IMPRESSION: Endotracheal tube terminates 6 cm above the carina. Right IJ catheter terminates in the lower SVC. Left IJ catheter terminates in the left brachiocephalic vein. Cardiomegaly with suspected mild to moderate interstitial edema and bilateral pleural effusions. Multifocal pneumonia is also possible. Electronically Signed   By: Julian Hy M.D.   On: 01/30/2018 21:42   Dg Chest  Port 1 View  Result Date: 01/30/2018 CLINICAL DATA:  57 y.o. male.with a history of type 2 diabetes hypertension and sleep apnea who was brought to the ED by his spouse in cardiac and respiratory arrest. now on vent, smoker EXAM: PORTABLE CHEST 1 VIEW COMPARISON:  Chest x-rays dated 01/28/2018 and 01/24/2018. FINDINGS: Study is hypoinspiratory. There is continued bilateral pulmonary edema pattern, not significantly changed compared to the most recent study of 01/28/2018, increased compared to the earlier study of 01/24/2018. Given the low lung volumes, heart size and mediastinal contours appear stable. Endotracheal tube tip is at the level of the clavicles, approximately 6 cm above the carina. LEFT jugular vein central line is stable in position with tip at the level of the upper SVC. No pneumothorax seen. Probable small bilateral pleural effusions. IMPRESSION: 1. Continued bilateral pulmonary edema pattern, not significantly changed compared to most recent chest x-ray of 01/28/2018. 2. Probable small bilateral pleural effusions. 3. Cardiomegaly. 4. Support apparatus appears stable in position. Endotracheal tube  tip is at the level of the clavicles, approximately 6 cm above the carina. Electronically Signed   By: Franki Cabot M.D.   On: 01/30/2018 09:37   Dg Chest Port 1 View  Result Date: 01/28/2018 CLINICAL DATA:  Line placement EXAM: PORTABLE CHEST 1 VIEW COMPARISON:  01/28/2018 FINDINGS: Endotracheal tube measures 6.8 cm above the carina. A left central venous catheter has been placed with tip over the confluence of the brachiocephalic vein and IVC. Shallow inspiration. Cardiac enlargement. Bilateral perihilar infiltrates. Probable small right pleural effusion. No pneumothorax is visible. IMPRESSION: Appliances appear in satisfactory position. Cardiac enlargement with bilateral perihilar infiltrates. Small right pleural effusion. No pneumothorax. Electronically Signed   By: Lucienne Capers M.D.   On:  01/28/2018 05:52   Dg Chest Portable 1 View  Result Date: 01/28/2018 CLINICAL DATA:  Short of breath EXAM: PORTABLE CHEST 1 VIEW COMPARISON:  01/24/2018, 01/21/2018 FINDINGS: Interval intubation, tip of the endotracheal tube is about 4 cm superior to the carina. Worsening bilateral interstitial and alveolar disease. No large effusion. Stable mild cardiomegaly. No pneumothorax. IMPRESSION: 1. Endotracheal tube tip about 3.5 cm superior to the carina. 2. Cardiomegaly. Interim worsening of bilateral interstitial and alveolar airspace disease. Electronically Signed   By: Donavan Foil M.D.   On: 01/28/2018 00:29   Dg Chest Port 1 View  Result Date: 01/24/2018 CLINICAL DATA:  Respiratory failure EXAM: PORTABLE CHEST 1 VIEW COMPARISON:  01/21/2018, 01/20/2018, 12/13/2017 FINDINGS: Removal of the endotracheal tube. Slight increased upper lobe ground-glass opacity. Improved aeration at the bases. Stable slightly enlarged cardiomediastinal silhouette. No pneumothorax. IMPRESSION: 1. Removal of endotracheal tube 2. Improved aeration at the bilateral lung bases. Slight interval increase in bilateral upper lobe ground-glass opacity. 3. Stable mild cardiomegaly Electronically Signed   By: Donavan Foil M.D.   On: 01/24/2018 03:58   Dg Chest Port 1 View  Result Date: 01/21/2018 CLINICAL DATA:  Acute respiratory failure. EXAM: PORTABLE CHEST 1 VIEW COMPARISON:  01/20/2018 FINDINGS: The endotracheal tube is in good position at the mid tracheal level. The lungs demonstrate improved aeration with resolving edema and atelectasis. Probable persistent small layering right pleural effusion. IMPRESSION: Stable position of the endotracheal tube. Improved lung aeration with resolving edema and atelectasis. Electronically Signed   By: Marijo Sanes M.D.   On: 01/21/2018 08:36   Dg Chest Portable 1 View  Result Date: 01/20/2018 CLINICAL DATA:  57 y/o  M; status post intubation. EXAM: PORTABLE CHEST 1 VIEW COMPARISON:   01/20/2018 chest radiograph FINDINGS: Stable cardiomegaly given projection and technique. Interstitial and alveolar pulmonary edema. Probable small effusions. Endotracheal tube tip 4.5 cm above the carina. Transcutaneous pacing pads noted. Bones are unremarkable. IMPRESSION: 1. Endotracheal tube tip 4.5 cm above the carina. 2. Stable cardiomegaly and pulmonary edema. Probable small effusions. Electronically Signed   By: Kristine Garbe M.D.   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 Stable cardiomegaly. Nonaneurysmal thoracic aorta. Diffuse bilateral airspace disease and vascular congestion consistent with pulmonary edema. No significant effusion or pneumothorax. No acute osseous abnormality. IMPRESSION: Stable cardiomegaly with pulmonary edema. Superimposed pneumonia is not entirely excluded but believed less likely. Electronically Signed   By: Ashley Royalty M.D.   On: 01/20/2018 02:56   Dg Addison Bailey G Tube Plc W/fl W/rad  Result Date: 01/20/2018 CLINICAL DATA:  Patient presents  for Dobbhoff tube placement. EXAM: NASO G TUBE PLACEMENT WITH FL AND WITH RAD FLUOROSCOPY TIME:  Fluoroscopy Time:  0.3 minute Radiation Exposure Index (if provided by the fluoroscopic device): 1.3 mGy Number of Acquired Spot Images: 0 COMPARISON:  None. FINDINGS: Multiple attempts at placing a Dobbhoff tube were made through the right and left nare. The tip of the Dobbhoff tube would not progressed beyond the level of the mid trachea on repeated attempts. Attempts were made at advancing the Dobbhoff tube following deflating the tracheostomy cuff, but the Dobbhoff tube could not advance any further. Examination was terminated at this time. IMPRESSION: 1. Multiple unsuccessful attempts at placing a Dobbhoff tube which would not progressed  beyond the level of the mid trachea Electronically Signed   By: Kathreen Devoid   On: 01/20/2018 16:41    Labs:  CBC: Recent Labs    01/28/18 0432 01/29/18 0210 02/01/18 0446 02/24/2018 0432  WBC 33.8* 25.9* 23.8* 19.4*  HGB 11.9* 10.3* 8.9* 8.9*  HCT 34.5* 30.1* 26.3* 26.4*  PLT 268 113* 100* 111*    COAGS: Recent Labs    01/20/18 0231  01/28/18 0433 01/28/18 1145 01/31/18 0450 02/01/18 0446 02/14/2018 0432  INR 0.91  --  1.29 1.36  --   --   --   APTT  --    < > 46* 40* 39* 35 34   < > = values in this interval not displayed.    BMP: Recent Labs    02/01/18 1645 02/24/2018 0012 02/24/2018 0432 02/03/18 0509  NA 137 136 137 140  K 4.8 5.0 4.9 4.5  CL 104 103 104 105  CO2 25 27 26 29   GLUCOSE 115* 140* 105* 93  BUN 39* 39* 39* 51*  CALCIUM 8.3* 8.2* 8.4* 8.0*  CREATININE 2.64* 2.39* 2.56* 3.43*  GFRNONAA 25* 28* 26* 18*  GFRAA 29* 33* 30* 21*    LIVER FUNCTION TESTS: Recent Labs    12/13/17 0956 01/20/18 0231 01/28/18 0034 01/28/18 0433  02/01/18 1218 02/01/18 1645 02/25/2018 0012 02/13/2018 0432  BILITOT 0.8 0.6 0.5 1.0  --   --   --   --   --   AST 59* 46* 58* 59*  --   --   --   --   --   ALT 59 36 31 36  --   --   --   --   --   ALKPHOS 64 62 49 54  --   --   --   --   --   PROT 8.4* 8.4* 5.8* 6.7  --   --   --   --   --   ALBUMIN 3.7 3.6 2.2* 2.6*   < > 2.0* 2.1* 2.0* 2.1*   < > = values in this interval not displayed.    TUMOR MARKERS: No results for input(s): AFPTM, CEA, CA199, CHROMGRNA in the last 8760 hours.  Assessment and Plan:  [name] is a 58 y.o. male with past history significant for diabetes, hypertension and sleep apnea who is been admitted to the hospital following cardiac and respiratory arrest post anoxic brain injury.  As such, request made for placement of percutaneous gastrostomy tube for enteric nutrition supplementation purposes.  Preceding noncontrast abdominal CT demonstrates anatomy amendable to gastrostomy tube  placement.  Risks and benefits of percutaneous gastrostomy tube placement discussed with the his wife including, but not limited to the need for a barium enema during the procedure, bleeding, infection, peritonitis, or  damage to adjacent structures.  All of the patient's wife's questions were answered, patient is agreeable to proceed.  Consent signed and in chart.   Thank you for this interesting consult.  I greatly enjoyed meeting QUINN QUAM and look forward to participating in their care.  A copy of this report was sent to the requesting provider on this date.  Electronically Signed: Sandi Mariscal, MD 02/03/2018, 2:10 PM   I spent a total of 20 Minutes in face to face in clinical consultation, greater than 50% of which was counseling/coordinating care for percutaneous gastrostomy tube placement.

## 2018-02-03 NOTE — Procedures (Signed)
Pre procedure Dx: Dysphagia Post Procedure Dx: Same  Successful fluoroscopic guided insertion of gastrostomy tube.   The gastrostomy tube may be used immediately for medications.   Tube feeds may be initiated in 24 hours as per the primary team.    EBL: Minimal  Complications: None immediate  Jay Jaeshaun Riva, MD Pager #: [phone]    

## 2018-02-03 NOTE — Progress Notes (Signed)
   02/03/18 1325  Clinical Encounter Type  Visited With Patient not available  Visit Type Follow-up   Chaplain attempted to follow up with patient and family; room was empty, attempt another time.

## 2018-02-04 LAB — GLUCOSE, CAPILLARY
GLUCOSE-CAPILLARY: 91 mg/dL (ref 70–99)
Glucose-Capillary: 102 mg/dL — ABNORMAL HIGH (ref 70–99)
Glucose-Capillary: 107 mg/dL — ABNORMAL HIGH (ref 70–99)
Glucose-Capillary: 112 mg/dL — ABNORMAL HIGH (ref 70–99)
Glucose-Capillary: 117 mg/dL — ABNORMAL HIGH (ref 70–99)
Glucose-Capillary: 153 mg/dL — ABNORMAL HIGH (ref 70–99)
Glucose-Capillary: 87 mg/dL (ref 70–99)

## 2018-02-04 LAB — TRIGLYCERIDES: TRIGLYCERIDES: 360 mg/dL — AB (ref <150)

## 2018-02-04 LAB — PHOSPHORUS: PHOSPHORUS: 6.8 mg/dL — AB (ref 2.5–4.6)

## 2018-02-04 MED ORDER — VITAL HIGH PROTEIN PO LIQD
1000.0000 mL | ORAL | Status: DC
  Administered 2018-02-04 – 2018-02-08 (×6): 1000 mL

## 2018-02-04 MED ORDER — PRO-STAT SUGAR FREE PO LIQD
30.0000 mL | Freq: Two times a day (BID) | ORAL | Status: DC
  Administered 2018-02-04 – 2018-02-09 (×10): 30 mL

## 2018-02-04 NOTE — Progress Notes (Signed)
Pharmacy Antibiotic Note  [name] is a 57 y.o. male admitted on 01/21/2018 s/p cardiac arrest and underwent targeted temperature management. Patient requiring mechanical ventilation; now off of CRRT.   Pharmacy has been consulted for meropenem dosing for sepsis. Patient with recent history requiring mechanical ventilation and discharged on 7/23 - patient received both vancomycin and cefepime during admission.   Plan: Patient with increased work of breathing and secretions. Continue meropenem 1g IV Q12hr for CrCl <15mL/min.    Height: 6\' 1"  (185.4 cm) Weight: 259 lb 14.8 oz (117.9 kg) IBW/kg (Calculated) : 79.9  Temp (24hrs), Avg:99 F (37.2 C), Min:98.6 F (37 C), Max:99.3 F (37.4 C)  Recent Labs  Lab 01/29/18 0210  01/30/18 0355  01/30/18 1036  01/30/18 1616  02/01/18 0446  02/01/18 1218 02/01/18 1645 02/01/18 2021 02/22/2018 0012 03/04/2018 0432 02/03/18 0509  WBC 25.9*  --   --   --   --   --   --   --  23.8*  --   --   --   --   --  19.4*  --   CREATININE 2.39*   < > 4.05*   < >  --    < >  --    < > 2.87*   < > 2.63* 2.64*  --  2.39* 2.56* 3.43*  LATICACIDVEN 3.5*  --  2.6*  --  1.8  --   --   --   --   --   --   --   --   --   --   --   VANCORANDOM  --    < >  --   --   --   --  23  --   --   --   --   --  16  --   --   --    < > = values in this interval not displayed.    Estimated Creatinine Clearance: 32 mL/min (A) (by C-G formula based on SCr of 3.43 mg/dL (H)).    Allergies  Allergen Reactions  . Enalapril Maleate Anaphylaxis  . Iodinated Diagnostic Agents Itching  . Isosorbide Mononitrate Er [Isosorbide Dinitrate] Other (See Comments)    "made me not be able to think or function"  . Canagliflozin Other (See Comments)    Antimicrobials this admission: Cefepime 7/19 >> 7/23, 7/27 >> 8/2 Vancomycin 7/19 >> 7/22, 7/27 >> 7/31 Meropenem 8/2 >>  Dose adjustments this admission: 7/29 vancomycin held due to vancomycin level of 32 7/29 CRRT initiated.  Patients cefepime and vancomycin adjusted  Microbiology results: 7/27 BCx: no growth x 5 days  7/27 UCx: no growth  7/27 Sputum: normal flora   7/27 MRSA PCR: negative  8/2 Tracheal Aspirate: few gram negative rods  Thank you for allowing pharmacy to be a part of this patient's care.  Pernell Dupre, PharmD, BCPS Clinical Pharmacist 02/04/2018 9:20 AM

## 2018-02-04 NOTE — Progress Notes (Signed)
Follow up - Critical Care Medicine Note  Patient Details:    [name] is an 57 y.o. male.with a history of type 2 diabetes hypertension and sleep apnea who was brought to the ED by his spouse in cardiac and respiratory arrest.    Lines, Airways, Drains: Airway 7 mm (Active)  Secured at (cm) 24 cm 01/29/2018  8:04 AM  Measured From Lips 01/29/2018  8:04 AM  [place] 01/29/2018  8:04 AM  Secured By [employer] 01/29/2018  8:04 AM  Tube Holder Repositioned Yes 01/29/2018  3:38 AM  Cuff Pressure (cm H2O) 25 cm H2O 01/29/2018  8:04 AM  Site Condition Dry 01/29/2018  8:04 AM     CVC Triple Lumen 01/28/18 Left Internal jugular (Active)  Indication for Insertion or Continuance of Line Vasoactive infusions 01/29/2018  8:00 AM  Site Assessment Clean;Dry;Intact 01/29/2018  8:00 AM  Proximal Lumen Status Infusing;Flushed;Blood return noted 01/29/2018  8:00 AM  Medial Lumen Status Infusing;Flushed;Blood return noted 01/29/2018  8:00 AM  Distal Lumen Status Infusing;Flushed;Blood return noted 01/29/2018  8:00 AM  Dressing Type Transparent;Occlusive 01/29/2018  8:00 AM  Dressing Status Clean;Dry;Intact;Antimicrobial disc in place 01/29/2018  8:00 AM  Line Care Connections checked and tightened;Zeroed and calibrated;Leveled 01/29/2018  8:00 AM  Dressing Intervention New dressing 01/28/2018  4:00 AM  Dressing Change Due 02/04/18 01/29/2018  8:00 AM     Arterial Line 01/28/18 Right Radial (Active)  Site Assessment Clean;Dry;Intact 01/29/2018  8:00 AM  Line Status Pulsatile blood flow;Positional 01/29/2018  8:00 AM  Art Line Waveform Appropriate 01/29/2018  8:00 AM  Art Line Interventions Zeroed and calibrated 01/29/2018  8:00 AM  Color/Movement/Sensation Capillary refill less than 3 sec 01/29/2018  8:00 AM  Dressing Type Transparent;Occlusive 01/29/2018  8:00 AM  Dressing Status Clean;Intact;Dry;Antimicrobial disc in place 01/29/2018  8:00 AM  Interventions New dressing;Antimicrobial disc  changed 01/28/2018  4:00 AM  Dressing Change Due 02/04/18 01/29/2018  8:00 AM     Urethral Catheter THAHN, ed tech Straight-tip;Temperature probe 16 Fr. (Active)  Indication for Insertion or Continuance of Catheter Unstable critical patients (first 24-48 hours) 01/29/2018  8:00 AM  Site Assessment Clean;Intact;Dry 01/29/2018  8:00 AM  Catheter Maintenance Bag below level of bladder;Catheter secured;Drainage bag/tubing not touching floor 01/29/2018  8:00 AM  Collection Container Standard drainage bag 01/29/2018  8:00 AM  Securement Method Other (Comment) 01/29/2018  8:00 AM  Output (mL) 0 mL 01/29/2018  8:00 AM    Anti-infectives:  Anti-infectives (From admission, onward)   Start     Dose/Rate Route Frequency Ordered Stop   02/03/18 1200  meropenem (MERREM) 1 g in sodium chloride 0.9 % 100 mL IVPB     1 g 200 mL/hr over 30 Minutes Intravenous Every 12 hours 02/03/18 1033     02/24/2018 1800  ceFEPIme (MAXIPIME) 2 g in sodium chloride 0.9 % 100 mL IVPB  Status:  Discontinued    Note to Pharmacy:  7 day stop date. Thank you   2 g 200 mL/hr over 30 Minutes Intravenous Every 12 hours 02/27/2018 0753 02/03/18 1031   01/30/18 2200  vancomycin (VANCOCIN) IVPB 1000 mg/200 mL premix  Status:  Discontinued     1,000 mg 200 mL/hr over 60 Minutes Intravenous Every 24 hours 01/30/18 2043 02/14/2018 0753   01/30/18 0500  vancomycin (VANCOCIN) 1,250 mg in sodium chloride 0.9 % 250 mL IVPB  Status:  Discontinued     1,250 mg 166.7 mL/hr over 90 Minutes Intravenous Every 18 hours  01/29/18 0956 01/30/18 0308   01/30/18 0307  vancomycin (VANCOCIN) 1,250 mg in sodium chloride 0.9 % 250 mL IVPB  Status:  Discontinued     1,250 mg 166.7 mL/hr over 90 Minutes Intravenous As needed 01/30/18 0308 02/01/18 1549   01/28/18 1400  vancomycin (VANCOCIN) 1,250 mg in sodium chloride 0.9 % 250 mL IVPB  Status:  Discontinued     1,250 mg 166.7 mL/hr over 90 Minutes Intravenous Every 18 hours 01/28/18 0544 01/29/18 0956   01/28/18  0600  ceFEPIme (MAXIPIME) 2 g in sodium chloride 0.9 % 100 mL IVPB  Status:  Discontinued     2 g 200 mL/hr over 30 Minutes Intravenous Every 12 hours 01/28/18 0544 02/12/2018 0753   01/28/18 0545  vancomycin (VANCOCIN) 1,250 mg in sodium chloride 0.9 % 250 mL IVPB     1,250 mg 166.7 mL/hr over 90 Minutes Intravenous  Once 01/28/18 0544 01/28/18 1020      Microbiology: Results for orders placed or performed during the hospital encounter of 01/31/2018  MRSA PCR Screening     Status: None   Collection Time: 01/28/18  5:00 AM  Result Value Ref Range Status   MRSA by PCR NEGATIVE NEGATIVE Final    Comment:        The GeneXpert MRSA Assay (FDA approved for NASAL specimens only), is one component of a comprehensive MRSA colonization surveillance program. It is not intended to diagnose MRSA infection nor to guide or monitor treatment for MRSA infections. Performed at [facility], [address], [place], [place] [address]   Urine Culture     Status: None   Collection Time: 01/28/18  6:16 AM  Result Value Ref Range Status   Specimen Description   Final    URINE, RANDOM Performed at [facility], [address] [place], [place] [address]    Special Requests   Final    NONE Performed at [facility], [address] [place], [place] [address]    Culture   Final    NO GROWTH Performed at [facility], [place] [address], [place], [place] [address]    Report Status 01/29/2018 FINAL  Final  Culture, blood (Routine X 2) w Reflex to ID Panel     Status: None   Collection Time: 01/28/18  6:45 AM  Result Value Ref Range Status   Specimen Description BLOOD LAC  Final   Special Requests   Final    BOTTLES DRAWN AEROBIC AND ANAEROBIC Blood Culture adequate volume   Culture   Final    NO GROWTH 5 DAYS Performed at [facility], [address] [place], [place] [address]    Report Status 02/16/2018 FINAL  Final  Culture, blood (Routine X 2) w  Reflex to ID Panel     Status: None   Collection Time: 01/28/18  6:56 AM  Result Value Ref Range Status   Specimen Description BLOOD RAC  Final   Special Requests   Final    BOTTLES DRAWN AEROBIC AND ANAEROBIC Blood Culture results may not be optimal due to an inadequate volume of blood received in culture bottles   Culture   Final    NO GROWTH 5 DAYS Performed at [facility], [address], [place], [place] [address]    Report Status 03/02/2018 FINAL  Final  Culture, respiratory (non-expectorated)     Status: None   Collection Time: 01/28/18 11:24 AM  Result Value Ref Range Status   Specimen  Description   Final    TRACHEAL ASPIRATE Performed at [facility], [address] [place], [place] [address]    Special Requests   Final    NONE Performed at [facility], [place], [place] [address]    Gram Stain   Final    RARE WBC PRESENT,BOTH PMN AND MONONUCLEAR RARE GRAM POSITIVE COCCI IN PAIRS    Culture   Final    RARE Consistent with normal respiratory flora. Performed at [facility], [place] [address], [place], [place] [address]    Report Status 01/30/2018 FINAL  Final  Culture, respiratory (non-expectorated)     Status: None (Preliminary result)   Collection Time: 02/03/18  7:53 AM  Result Value Ref Range Status   Specimen Description   Final    TRACHEAL ASPIRATE Performed at [facility], [address] [place], [place] [address]    Special Requests   Final    NONE Performed at [facility], [address] [place], [place] [address]    Gram Stain   Final    ABUNDANT WBC PRESENT, PREDOMINANTLY PMN FEW GRAM NEGATIVE RODS Performed at [facility], [place] [address], [place],  [address]    Culture PENDING  Incomplete   Report Status PENDING  Incomplete   Events:   Studies: Ct Abdomen Wo Contrast  Result Date: 02/03/2018 CLINICAL DATA:  Evaluate anatomy prior to potential  percutaneous gastrostomy tube placement. EXAM: CT ABDOMEN WITHOUT CONTRAST TECHNIQUE: Multidetector CT imaging of the abdomen was performed following the standard protocol without IV contrast. COMPARISON:  Abdominal radiographs-01/28/2018; chest radiograph-earlier same day FINDINGS: Lower chest: Limited visualization of the lower thorax demonstrates small/trace bilateral pleural effusions, right greater than left. Consolidative opacities are seen within the bilateral lung bases with associated air bronchograms, right greater than left. Cardiomegaly. Coronary artery calcifications. Diffuse decreased attenuation intra cardiac blood pool suggestive of anemia. Tip of dialysis catheter terminates within the distal SVC. Hepatobiliary: Hepatomegaly. There is mild nodularity of the hepatic contour. Layering debris is seen within otherwise normal-appearing gallbladder, similar to the 02/2009 examination and likely indicative of biliary sludge. No ascites. Pancreas: Normal noncontrast appearance of the pancreas Spleen: Interval development of a dystrophic calcification within a ill-defined hypoattenuating splenic lesion which appears otherwise unchanged compared to the 2010 examination and favored to represent a minimally complex splenic cyst. Adrenals/Urinary Tract: Minimal amount of bilateral perinephric stranding. No urinary obstruction. No renal stones. Normal noncontrast appearance the bilateral adrenal glands. Stomach/Bowel: The anterior wall the stomach is well apposed against the ventral wall of the abdomen without interposed liver or transverse colon. No hiatal hernia. No evidence of enteric obstruction. No pneumoperitoneum, pneumatosis or portal venous gas. Vascular/Lymphatic: Calcified atherosclerotic plaque within normal caliber abdominal aorta. No bulky retroperitoneal or mesenteric lymphadenopathy on this noncontrast examination. Other: Diffuse body wall anasarca. Scattered foci of subcutaneous emphysema within  the abdominal pannus likely at the location of subcutaneous medication administration Musculoskeletal: No acute or aggressive osseous abnormalities. IMPRESSION: 1. Gastric anatomy amenable to potential percutaneous gastrostomy tube placement as indicated. 2. Trace/small bilateral effusions with bibasilar consolidative opacities with associated air bronchograms, right greater than left, worrisome for multifocal infection/aspiration. 3. Suspected cholelithiasis. 4.  Aortic Atherosclerosis (ICD10-I70.0). Electronically Signed   By: Sandi Mariscal M.D.   On: 02/03/2018 14:09   Dg Abd 1 View  Result Date: 01/28/2018 CLINICAL DATA:  NG tube placement EXAM: ABDOMEN - 1 VIEW COMPARISON:  01/20/2018 FINDINGS: No enteric tube  is demonstrated within the field of view. Mild gaseous distention of the stomach. IMPRESSION: No enteric tube visualized. Electronically Signed   By: Lucienne Capers M.D.   On: 01/28/2018 05:53   Dg Abd 1 View  Result Date: 01/20/2018 CLINICAL DATA:  Encounter for NG tube placement EXAM: ABDOMEN - 1 VIEW COMPARISON:  01/20/2018 FINDINGS: Malpositioned nasogastric tube, coiled backwards overlying the mid esophagus with tip excluded from view Endotracheal tube overlies the tracheal air column with tip superior to the carina. No pneumothorax. Pacer leads overlie the left ventricular apex. Pulmonary vasculature remains indistinct with cephalization of flow. Suspected trace right-sided pleural effusion. No definite left-sided pleural effusion, though the left costophrenic angle is excluded from view. Nonobstructive bowel gas pattern. No pneumoperitoneum, pneumatosis or portal venous gas. IMPRESSION: 1. Malpositioned nasogastric tube.  Repositioning is advised. 2. Similar findings of pulmonary edema and trace right-sided pleural effusion. 3. Nonobstructive bowel gas pattern. Electronically Signed   By: Sandi Mariscal M.D.   On: 01/20/2018 14:03   Dg Abd 1 View  Result Date: 01/20/2018 CLINICAL DATA:  NG  tube placement EXAM: ABDOMEN - 1 VIEW COMPARISON:  None. FINDINGS: There is no nasogastric tube identified in the lower chest or abdomen. There is mild gaseous distension of the colon and stomach. There is no evidence of pneumoperitoneum, portal venous gas or pneumatosis. There are no pathologic calcifications along the expected course of the ureters. The osseous structures are unremarkable. IMPRESSION: No nasogastric tube identified in the lower chest or abdomen. Electronically Signed   By: Kathreen Devoid   On: 01/20/2018 12:42   Dg Abd 1 View  Result Date: 01/20/2018 CLINICAL DATA:  Abdominal pain. EXAM: ABDOMEN - 1 VIEW COMPARISON:  None. FINDINGS: A right-sided pleural effusion is suspected layering posteriorly. Pacer paddles are noted on the chest. The bowel gas pattern is unremarkable. Air and stool scattered throughout the colon. A few scattered air-filled small bowel loops. No obvious free air. A right femoral line is noted. Bony structures are unremarkable. IMPRESSION: Suspect right pleural effusion. No plain film findings for an acute abdominal process. Electronically Signed   By: Marijo Sanes M.D.   On: 01/20/2018 11:22   Ct Head Wo Contrast  Result Date: 01/30/2018 CLINICAL DATA:  Anoxic brain injury EXAM: CT HEAD WITHOUT CONTRAST TECHNIQUE: Contiguous axial images were obtained from the base of the skull through the vertex without intravenous contrast. COMPARISON:  Head CT 01/28/2018 FINDINGS: Brain: There is diffuse blurring of the gray-white interface, slightly greater than on the prior examination. There is crowding of the basal cisterns. No acute hemorrhage or other extra-axial collection. Vascular: No abnormal hyperdensity of the major intracranial arteries or dural venous sinuses. No intracranial atherosclerosis. Skull: The visualized skull base, calvarium and extracranial soft tissues are normal. Sinuses/Orbits: No fluid levels or advanced mucosal thickening of the visualized paranasal  sinuses. No mastoid or middle ear effusion. The orbits are normal. IMPRESSION: Diffuse blurring of the gray-white interface, compatible with global edema, slightly progressed compared to 01/28/2018. Mild worsening of basal cisternal crowding. Electronically Signed   By: Ulyses Jarred M.D.   On: 01/30/2018 13:17   Ct Head Wo Contrast  Result Date: 01/28/2018 CLINICAL DATA:  Cardiac arrest. History of hypertension and diabetes. EXAM: CT HEAD WITHOUT CONTRAST TECHNIQUE: Contiguous axial images were obtained from the base of the skull through the vertex without intravenous contrast. COMPARISON:  CT neck January 20, 2018. FINDINGS: BRAIN: No intraparenchymal hemorrhage, mass effect nor midline shift. Mild blurring of the  gray-white matter differentiation. Patchy supratentorial white matter hypodensities. The ventricles and sulci are normal. No acute large vascular territory infarcts. No abnormal extra-axial fluid collections. Basal cisterns are patent. VASCULAR: Mild calcific atherosclerosis carotid bifurcations. SKULL/SOFT TISSUES: No skull fracture. No significant soft tissue swelling. ORBITS/SINUSES: The included ocular globes and orbital contents are normal.Mild paranasal sinus mucosal thickening without air-fluid levels. Included mastoid air cells are well aerated. OTHER: Life-support lines in place. IMPRESSION: 1. 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 2. Mild chronic small vessel ischemic changes. 3. Mild atherosclerosis. 4. Acute findings discussed with and reconfirmed by Dr.ALLISON WEBSTER on 01/28/2018 at 1:00 am. Electronically Signed   By: Elon Alas M.D.   On: 01/28/2018 01:04   Ct Soft Tissue Neck Wo Contrast  Result Date: 01/20/2018 CLINICAL DATA:  57 y/o  M; thyroid nodule. EXAM: CT NECK WITHOUT CONTRAST TECHNIQUE: Multidetector CT imaging of the neck was performed following the standard protocol without intravenous contrast. COMPARISON:  None. FINDINGS: Pharynx  and larynx: Debris within the airways from intubation. Salivary glands: No inflammation, mass, or stone. Thyroid: 5.6 cm nodule within the left lobe of the thyroid. 18 mm nodule in the right lobe of thyroid. Lymph nodes: None enlarged or abnormal density. Vascular: Negative. Limited intracranial: Negative. Visualized orbits: Negative. Mastoids and visualized paranasal sinuses: Mild mucosal thickening of the left maxillary sinus. Normal aeration of the mastoid air cells. Skeleton: No acute or aggressive process. Dental disease with multiple periapical cysts and absent crowns. Mild cervical spondylosis greatest at the C5-6 level. Upper chest: Endotracheal tube tip 3.4 cm above the carina. Moderate bilateral pleural effusions and dependent atelectasis of the lungs. Other: None. IMPRESSION: 1. Thyroid nodules measuring up to 5.6 cm in the left lobe of thyroid. Mass effect from the large left lobe of thyroid nodule displaces the airway rightward. 2. Endotracheal tube tip 3.4 cm above the carina. 3. Moderate bilateral pleural effusion with dependent atelectasis of the lungs. Electronically Signed   By: Kristine Garbe M.D.   On: 01/20/2018 04:44   Ir Gastrostomy Tube Mod Sed  Result Date: 02/03/2018 INDICATION: Dysphagia secondary to cardiac arrest and anoxic brain injury. Please perform percutaneous gastrostomy tube placement for enteric nutrition supplementation purposes. EXAM: PULL TROUGH GASTROSTOMY TUBE PLACEMENT COMPARISON:  Noncontrast abdominal CT - earlier same day MEDICATIONS: The patient is currently admitted to hospital receiving intravenous antibiotics; Antibiotics were administered within 1 hour of the procedure. Glucagon 1 mg IV CONTRAST:  20 mL of Isovue 300 administered into the gastric lumen. ANESTHESIA/SEDATION: None - patient is on continuous propofol drip FLUOROSCOPY TIME:  1 minutes 12 seconds (40 mGy) COMPLICATIONS: None immediate. PROCEDURE: Informed written consent was obtained from  the patient's family following explanation of the procedure, risks, benefits and alternatives. A time out was performed prior to the initiation of the procedure. Ultrasound scanning was performed to demarcate the edge of the left lobe of the liver. Maximal barrier sterile technique utilized including caps, mask, sterile gowns, sterile gloves, large sterile drape, hand hygiene and Betadine prep. The left upper quadrant was sterilely prepped and draped. An oral gastric catheter was inserted into the stomach under fluoroscopy. The existing nasogastric feeding tube was removed. The left costal margin and air opacified transverse colon were identified and avoided. Air was injected into the stomach for insufflation and visualization under fluoroscopy. Under sterile conditions a 17 gauge trocar needle was utilized to access the stomach percutaneously beneath the left subcostal margin after the overlying soft tissues were anesthetized with 1%  Lidocaine with epinephrine. Needle position was confirmed within the stomach with aspiration of air and injection of small amount of contrast. A single T tack was deployed for gastropexy. Over an Amplatz guide wire, a 9-French sheath was inserted into the stomach. A snare device was utilized to capture the oral gastric catheter. The snare device was pulled retrograde from the stomach up the esophagus and out the oropharynx. The 20-French pull-through gastrostomy was connected to the snare device and pulled antegrade through the oropharynx down the esophagus into the stomach and then through the percutaneous tract external to the patient. The gastrostomy was assembled externally. Contrast injection confirms position in the stomach. Several spot radiographic images were obtained in various obliquities for documentation. The patient tolerated procedure well without immediate post procedural complication. FINDINGS: After successful fluoroscopic guided placement, the gastrostomy tube is  appropriately positioned with internal disc against the ventral aspect of the gastric lumen. IMPRESSION: Successful fluoroscopic insertion of a 20-French pull-through gastrostomy tube. The gastrostomy may be used immediately for medication administration and in 24 hrs for the initiation of feeds. Electronically Signed   By: Sandi Mariscal M.D.   On: 02/03/2018 14:24   Dg Chest Port 1 View  Result Date: 02/03/2018 CLINICAL DATA:  Ventilation. EXAM: PORTABLE CHEST 1 VIEW COMPARISON:  Radiograph of February 01, 2018. FINDINGS: Endotracheal tube is in good position. Right internal jugular catheter is unchanged. Stable cardiomegaly with central pulmonary vascular congestion is noted. Mild bibasilar edema or atelectasis is noted with small right pleural effusion. No pneumothorax is noted. Bony thorax is unremarkable. IMPRESSION: Stable cardiomegaly with central pulmonary vascular congestion. Endotracheal tube in good position. Mild bibasilar subsegmental atelectasis or edema is noted with small right pleural effusion. Electronically Signed   By: Marijo Conception, M.D.   On: 02/03/2018 10:19   Dg Chest Port 1 View  Result Date: 02/01/2018 CLINICAL DATA:  Cardiac and respiratory arrest.  Post intubation. EXAM: PORTABLE CHEST 1 VIEW COMPARISON:  01/31/2018 FINDINGS: Endotracheal tube 6.9 cm above the carina. Advancement with 3-4 cm may be considered. Central lines in stable position. Enlarged globular heart.  Mediastinal contours appear intact. Bilateral pleural effusions and interstitial pulmonary edema. More focal airspace consolidation in the right lung base cannot be excluded. Osseous structures are without acute abnormality. Soft tissues are grossly normal. IMPRESSION: Endotracheal tube 6.9 cm above the carina. Advancement with 3-4 cm may be considered. Enlarged globular heart. This may be due to cardiomegaly or pericardial effusion. Interstitial pulmonary edema with moderate in size bilateral pleural effusions. More  focal airspace consolidation in the right lung base cannot be excluded. Electronically Signed   By: Fidela Salisbury M.D.   On: 02/01/2018 09:36   Dg Chest Port 1 View  Result Date: 01/31/2018 CLINICAL DATA:  Check endotracheal tube EXAM: PORTABLE CHEST 1 VIEW COMPARISON:  01/30/2018 FINDINGS: Endotracheal tube is noted at the thoracic inlet. Bilateral jugular catheters are again noted and stable. Cardiac shadow is stable but enlarged. Persistent right-sided infiltrate and effusion is seen. The left lung is clear. IMPRESSION: Right-sided effusion and infiltrate stable from the prior exam. Tubes and lines as described. Electronically Signed   By: Inez Catalina M.D.   On: 01/31/2018 10:49   Dg Chest Port 1 View  Result Date: 01/30/2018 CLINICAL DATA:  Central line placement EXAM: PORTABLE CHEST 1 VIEW COMPARISON:  01/30/2018 at 0918 hours FINDINGS: Endotracheal tube terminates 6 cm above the carina. Right IJ dual lumen catheter terminates in the lower SVC. Left IJ venous  catheter terminates in the left brachiocephalic vein. Cardiomegaly with mild to moderate interstitial edema. Multifocal pneumonia is also possible. Suspected bilateral pleural effusions, right greater than left. No pneumothorax. IMPRESSION: Endotracheal tube terminates 6 cm above the carina. Right IJ catheter terminates in the lower SVC. Left IJ catheter terminates in the left brachiocephalic vein. Cardiomegaly with suspected mild to moderate interstitial edema and bilateral pleural effusions. Multifocal pneumonia is also possible. Electronically Signed   By: Julian Hy M.D.   On: 01/30/2018 21:42   Dg Chest Port 1 View  Result Date: 01/30/2018 CLINICAL DATA:  57 y.o. male.with a history of type 2 diabetes hypertension and sleep apnea who was brought to the ED by his spouse in cardiac and respiratory arrest. now on vent, smoker EXAM: PORTABLE CHEST 1 VIEW COMPARISON:  Chest x-rays dated 01/28/2018 and 01/24/2018. FINDINGS: Study  is hypoinspiratory. There is continued bilateral pulmonary edema pattern, not significantly changed compared to the most recent study of 01/28/2018, increased compared to the earlier study of 01/24/2018. Given the low lung volumes, heart size and mediastinal contours appear stable. Endotracheal tube tip is at the level of the clavicles, approximately 6 cm above the carina. LEFT jugular vein central line is stable in position with tip at the level of the upper SVC. No pneumothorax seen. Probable small bilateral pleural effusions. IMPRESSION: 1. Continued bilateral pulmonary edema pattern, not significantly changed compared to most recent chest x-ray of 01/28/2018. 2. Probable small bilateral pleural effusions. 3. Cardiomegaly. 4. Support apparatus appears stable in position. Endotracheal tube tip is at the level of the clavicles, approximately 6 cm above the carina. Electronically Signed   By: Franki Cabot M.D.   On: 01/30/2018 09:37   Dg Chest Port 1 View  Result Date: 01/28/2018 CLINICAL DATA:  Line placement EXAM: PORTABLE CHEST 1 VIEW COMPARISON:  01/28/2018 FINDINGS: Endotracheal tube measures 6.8 cm above the carina. A left central venous catheter has been placed with tip over the confluence of the brachiocephalic vein and IVC. Shallow inspiration. Cardiac enlargement. Bilateral perihilar infiltrates. Probable small right pleural effusion. No pneumothorax is visible. IMPRESSION: Appliances appear in satisfactory position. Cardiac enlargement with bilateral perihilar infiltrates. Small right pleural effusion. No pneumothorax. Electronically Signed   By: Lucienne Capers M.D.   On: 01/28/2018 05:52   Dg Chest Portable 1 View  Result Date: 01/28/2018 CLINICAL DATA:  Short of breath EXAM: PORTABLE CHEST 1 VIEW COMPARISON:  01/24/2018, 01/21/2018 FINDINGS: Interval intubation, tip of the endotracheal tube is about 4 cm superior to the carina. Worsening bilateral interstitial and alveolar disease. No large  effusion. Stable mild cardiomegaly. No pneumothorax. IMPRESSION: 1. Endotracheal tube tip about 3.5 cm superior to the carina. 2. Cardiomegaly. Interim worsening of bilateral interstitial and alveolar airspace disease. Electronically Signed   By: Donavan Foil M.D.   On: 01/28/2018 00:29   Dg Chest Port 1 View  Result Date: 01/24/2018 CLINICAL DATA:  Respiratory failure EXAM: PORTABLE CHEST 1 VIEW COMPARISON:  01/21/2018, 01/20/2018, 12/13/2017 FINDINGS: Removal of the endotracheal tube. Slight increased upper lobe ground-glass opacity. Improved aeration at the bases. Stable slightly enlarged cardiomediastinal silhouette. No pneumothorax. IMPRESSION: 1. Removal of endotracheal tube 2. Improved aeration at the bilateral lung bases. Slight interval increase in bilateral upper lobe ground-glass opacity. 3. Stable mild cardiomegaly Electronically Signed   By: Donavan Foil M.D.   On: 01/24/2018 03:58   Dg Chest Port 1 View  Result Date: 01/21/2018 CLINICAL DATA:  Acute respiratory failure. EXAM: PORTABLE CHEST 1 VIEW COMPARISON:  01/20/2018 FINDINGS: The endotracheal tube is in good position at the mid tracheal level. The lungs demonstrate improved aeration with resolving edema and atelectasis. Probable persistent small layering right pleural effusion. IMPRESSION: Stable position of the endotracheal tube. Improved lung aeration with resolving edema and atelectasis. Electronically Signed   By: Marijo Sanes M.D.   On: 01/21/2018 08:36   Dg Chest Portable 1 View  Result Date: 01/20/2018 CLINICAL DATA:  57 y/o  M; status post intubation. EXAM: PORTABLE CHEST 1 VIEW COMPARISON:  01/20/2018 chest radiograph FINDINGS: Stable cardiomegaly given projection and technique. Interstitial and alveolar pulmonary edema. Probable small effusions. Endotracheal tube tip 4.5 cm above the carina. Transcutaneous pacing pads noted. Bones are unremarkable. IMPRESSION: 1. Endotracheal tube tip 4.5 cm above the carina. 2. Stable  cardiomegaly and pulmonary edema. Probable small effusions. Electronically Signed   By: Kristine Garbe M.D.   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 Stable cardiomegaly. Nonaneurysmal thoracic aorta. Diffuse bilateral airspace disease and vascular congestion consistent with pulmonary edema. No significant effusion or pneumothorax. No acute osseous abnormality. IMPRESSION: Stable cardiomegaly with pulmonary edema. Superimposed pneumonia is not entirely excluded but believed less likely. Electronically Signed   By: Ashley Royalty M.D.   On: 01/20/2018 02:56   Dg Addison Bailey G Tube Plc W/fl W/rad  Result Date: 01/20/2018 CLINICAL DATA:  Patient presents for Dobbhoff tube placement. EXAM: NASO G TUBE PLACEMENT WITH FL AND WITH RAD FLUOROSCOPY TIME:  Fluoroscopy Time:  0.3 minute Radiation Exposure Index (if provided by the fluoroscopic device): 1.3 mGy Number of Acquired Spot Images: 0 COMPARISON:  None. FINDINGS: Multiple attempts at placing a Dobbhoff tube were made through the right and left nare. The tip of the Dobbhoff tube would not progressed beyond the level of the mid trachea on repeated attempts. Attempts were made at advancing the Dobbhoff tube following deflating the tracheostomy cuff, but the Dobbhoff tube could not advance any further. Examination was terminated at this time. IMPRESSION: 1. Multiple unsuccessful attempts at placing a Dobbhoff tube which would not progressed beyond the level of the mid trachea Electronically Signed   By: Kathreen Devoid   On: 01/20/2018 16:41    Consults: Treatment Team:  Teodoro Spray, MD Leotis Pain, MD Anthonette Legato, MD Carloyn Manner, MD   Subjective:    Overnight Issues: patient is status post IR guided G-tube placement. Also had bronchoscopy for  copious secretions. No significant change overnight  Objective:  Vital signs for last 24 hours: Temp:  [98.6 F (37 C)-99.3 F (37.4 C)] 99.1 F (37.3 C) (08/03 0600) Pulse Rate:  [89-104] 91 (08/03 0600) Resp:  [9-27] 24 (08/03 0600) BP: (131-162)/(74-96) 139/85 (08/03 0600) SpO2:  [97 %-100 %] 98 % (08/03 0826) FiO2 (%):  [28 %] 28 % (08/03 0826)  Hemodynamic parameters for last 24 hours:    Intake/Output from previous day: 08/02 0701 - 08/03 0700 In: 1044.6 [I.V.:737.9; IV Piggyback:306.7] Out: 505 [Urine:505]  Intake/Output this shift: No intake/output data recorded.  Vent settings for last 24 hours: Vent Mode: PRVC FiO2 (%):  [28 %] 28 % Set Rate:  [25 bmp] 25 bmp Vt Set:  [500 mL] 500 mL PEEP:  [5 cmH20] 5 cmH20 Plateau Pressure:  [16 [id number] cmH20] 18 cmH20  Physical Exam:  Vital signs: . Please see the above  listed vital signs HEENT: Patient is orally intubated, trachea is midline, no accessory muscle utilization Cardiovascular: Regular rate and rhythm Pulmonary: Coarse rhonchi Abdominal: Hypoactive bowel sounds, soft exam, g-tube in place Extremities: No clubbing cyanosis or edema noted Neurologic: Patient does have occasional myoclonus otherwise unchanged neurologic exam   Assessment/Plan:   Status post cardiac arrest.  Status post targeted temperature management. No significant improvement in neurologic status, anoxic brain injury Appreciate neurology. Pending tracheostomy,   History of severe cardiomyopathy with systolic failure. Measured ejection fraction of 15% on echocardiogram yesterday  Renal failure. Presently on HD greatly appreciate nephrology's assistance  Leukocytosis. On meropenem, will complete a seven-day course. Pending results of blood cultures and respiratory cultures  Anemia. No evidence of active bleeding  Goiter.   Critical care time 30 minutes   Jakaden Ouzts 02/04/2018  *Care during the described time interval was provided  by me and/or other providers on the critical care team.  I have reviewed this patient's available data, including medical history, events of note, physical examination and test results as part of my evaluation. Patient ID: SHUAIB CORSINO, male   DOB: 09-16-1960, 57 y.o.   MRN: [id number]  Patient ID: ERNAN RUNKLES, male   DOB: 27-Apr-1961, 57 y.o.   MRN: [id number] Patient ID: LIBERO PUTHOFF, male   DOB: 05-17-1961, 57 y.o.   MRN: [id number] Patient ID: JAZMINE LONGSHORE, male   DOB: [birth year], 57 y.o.   MRN: [id number] Patient ID: AREEB CORRON, male   DOB: [birth year], 57 y.o.   MRN: [id number]

## 2018-02-04 NOTE — Progress Notes (Signed)
This note also relates to the following rows which could not be included: Temp - Cannot attach notes to unvalidated device data Pulse Rate - Cannot attach notes to unvalidated device data Resp - Cannot attach notes to unvalidated device data SpO2 - Cannot attach notes to unvalidated device data End Tidal CO2 (EtCO2) - Cannot attach notes to unvalidated device data  Hd started

## 2018-02-04 NOTE — Progress Notes (Signed)
[facility]  ROUNDING NOTE   Subjective:  Patient is still intubated and sedated. PEG tube placed. Urine output only 505 cc over the preceding 24 hours. Dialysis planned for today.   Objective:  Vital signs in last 24 hours:  Temp:  [98.6 F (37 C)-99.3 F (37.4 C)] 99.1 F (37.3 C) (08/03 0900) Pulse Rate:  [89-104] 99 (08/03 0900) Resp:  [9-27] 24 (08/03 0900) BP: (131-162)/(74-96) 152/91 (08/03 0900) SpO2:  [96 %-100 %] 97 % (08/03 0900) FiO2 (%):  [28 %] 28 % (08/03 0826)  Weight change:  Filed Weights   02/26/2018 1145 02/26/2018 1530 02/03/18 0500  Weight: 120.9 kg (266 lb 8.6 oz) 118.7 kg (261 lb 11 oz) 117.9 kg (259 lb 14.8 oz)    Intake/Output: I/O last 3 completed shifts: In: 1552.4 [I.V.:1095.8; IV Piggyback:456.7] Out: 638 [Urine:705]   Intake/Output this shift:  No intake/output data recorded.  Physical Exam: General: Critically ill appearing  Head: Normocephalic, atraumatic. Moist oral mucosal membranes  Eyes: Anicteric  Neck: Supple, trachea midline  Lungs:  Clear to auscultation, vent assisted  Heart: S1S2 no rubs  Abdomen:  Soft, nontender, bowel sounds present  Extremities: 1+ peripheral edema.  Neurologic: Comatose, tremors in b/l LE's  Skin: No lesions  Access: Right femoral dialysis catheter    Basic Metabolic Panel: Recent Labs  Lab 02/01/18 1218 02/01/18 1645 02/01/18 2021 02/19/2018 0012 02/26/2018 0432 03/01/2018 1234 02/03/18 0509  NA 136 137  --  136 137  --  140  K 4.5 4.8  --  5.0 4.9  --  4.5  CL 102 104  --  103 104  --  105  CO2 25 25  --  27 26  --  29  GLUCOSE 119* 115*  --  140* 105*  --  93  BUN 41* 39*  --  39* 39*  --  51*  CREATININE 2.63* 2.64*  --  2.39* 2.56*  --  3.43*  CALCIUM 8.2* 8.3*  --  8.2* 8.4*  --  8.0*  MG 2.2 2.3 2.3 2.3 2.2  --   --   PHOS 5.1* 4.7*  --  4.4 4.1 5.4*  --     Liver Function Tests: Recent Labs  Lab 02/01/18 0812 02/01/18 1218 02/01/18 1645 02/07/2018 0012  02/07/2018 0432  ALBUMIN 1.8* 2.0* 2.1* 2.0* 2.1*   No results for input(s): LIPASE, AMYLASE in the last 168 hours. No results for input(s): AMMONIA in the last 168 hours.  CBC: Recent Labs  Lab 01/29/18 0210 02/01/18 0446 02/10/2018 0432  WBC 25.9* 23.8* 19.4*  NEUTROABS  --  19.7* 14.9*  HGB 10.3* 8.9* 8.9*  HCT 30.1* 26.3* 26.4*  MCV 92.2 91.6 91.9  PLT 113* 100* 111*    Cardiac Enzymes: Recent Labs  Lab 01/28/18 1440 01/28/18 2151 01/29/18 0210 01/30/18 0006  CKTOTAL  --   --   --  385  TROPONINI 1.17* 1.36* 1.35*  --     BNP: Invalid input(s): POCBNP  CBG: Recent Labs  Lab 02/03/18 1746 02/03/18 2010 02/04/18 0002 02/04/18 0340 02/04/18 0746  GLUCAP 91 95 117* 102* 107*    Microbiology: Results for orders placed or performed during the hospital encounter of 01/13/2018  MRSA PCR Screening     Status: None   Collection Time: 01/28/18  5:00 AM  Result Value Ref Range Status   MRSA by PCR NEGATIVE NEGATIVE Final    Comment:        The GeneXpert MRSA  Assay (FDA approved for NASAL specimens only), is one component of a comprehensive MRSA colonization surveillance program. It is not intended to diagnose MRSA infection nor to guide or monitor treatment for MRSA infections. Performed at [facility], [address] [place], [place] [address]   Urine Culture     Status: None   Collection Time: 01/28/18  6:16 AM  Result Value Ref Range Status   Specimen Description   Final    URINE, RANDOM Performed at [facility], [address] [place], [place] [address]    Special Requests   Final    NONE Performed at [facility], [address] [place], [place] [address]    Culture   Final    NO GROWTH Performed at [facility], [place] [address], [place], [place] [address]    Report Status 01/29/2018 FINAL  Final  Culture, blood (Routine X 2) w Reflex to ID Panel     Status: None   Collection Time: 01/28/18  6:45 AM   Result Value Ref Range Status   Specimen Description BLOOD LAC  Final   Special Requests   Final    BOTTLES DRAWN AEROBIC AND ANAEROBIC Blood Culture adequate volume   Culture   Final    NO GROWTH 5 DAYS Performed at [facility], [address] [place] [place], [place] [address]    Report Status 02/22/2018 FINAL  Final  Culture, blood (Routine X 2) w Reflex to ID Panel     Status: None   Collection Time: 01/28/18  6:56 AM  Result Value Ref Range Status   Specimen Description BLOOD RAC  Final   Special Requests   Final    BOTTLES DRAWN AEROBIC AND ANAEROBIC Blood Culture results may not be optimal due to an inadequate volume of blood received in culture bottles   Culture   Final    NO GROWTH 5 DAYS Performed at [facility], [address] [place], [place] [address]    Report Status 02/05/2018 FINAL  Final  Culture, respiratory (non-expectorated)     Status: None   Collection Time: 01/28/18 11:24 AM  Result Value Ref Range Status   Specimen Description   Final    TRACHEAL ASPIRATE Performed at [facility], [address], [place], [place], [place] [address]    Special Requests   Final    NONE Performed at [facility], [address] [place], [place] [address]    Gram Stain   Final    RARE WBC PRESENT,BOTH PMN AND MONONUCLEAR RARE GRAM POSITIVE COCCI IN PAIRS    Culture   Final    RARE Consistent with normal respiratory flora. Performed at [facility], [place] [address], [place], [place] [address]    Report Status 01/30/2018 FINAL  Final  Culture, respiratory (non-expectorated)     Status: None (Preliminary result)   Collection Time: 02/03/18  7:53 AM  Result Value Ref Range Status   Specimen Description   Final    TRACHEAL ASPIRATE Performed at [facility], [address], [place], [place] [address]    Special Requests   Final    NONE Performed at [facility], [address] [place],  [address]    Gram  Stain   Final    ABUNDANT WBC PRESENT, PREDOMINANTLY PMN FEW GRAM NEGATIVE RODS    Culture   Final    CULTURE REINCUBATED FOR BETTER GROWTH Performed at [facility], [address] [place], [place] [address]    Report Status PENDING  Incomplete    Coagulation Studies: No results for input(s): LABPROT, INR in the last 72 hours.  Urinalysis: No results for input(s): COLORURINE, LABSPEC, PHURINE, GLUCOSEU, HGBUR, BILIRUBINUR, KETONESUR, PROTEINUR, UROBILINOGEN, NITRITE, LEUKOCYTESUR in the last 72 hours.  Invalid input(s): APPERANCEUR    Imaging: Ct Abdomen Wo Contrast  Result Date: 02/03/2018 CLINICAL DATA:  Evaluate anatomy prior to potential percutaneous gastrostomy tube placement. EXAM: CT ABDOMEN WITHOUT CONTRAST TECHNIQUE: Multidetector CT imaging of the abdomen was performed following the standard protocol without IV contrast. COMPARISON:  Abdominal radiographs-01/28/2018; chest radiograph-earlier same day FINDINGS: Lower chest: Limited visualization of the lower thorax demonstrates small/trace bilateral pleural effusions, right greater than left. Consolidative opacities are seen within the bilateral lung bases with associated air bronchograms, right greater than left. Cardiomegaly. Coronary artery calcifications. Diffuse decreased attenuation intra cardiac blood pool suggestive of anemia. Tip of dialysis catheter terminates within the distal SVC. Hepatobiliary: Hepatomegaly. There is mild nodularity of the hepatic contour. Layering debris is seen within otherwise normal-appearing gallbladder, similar to the 02/2009 examination and likely indicative of biliary sludge. No ascites. Pancreas: Normal noncontrast appearance of the pancreas Spleen: Interval development of a dystrophic calcification within a ill-defined hypoattenuating splenic lesion which appears otherwise unchanged compared to the 2010 examination and favored to represent a minimally complex splenic cyst.  Adrenals/Urinary Tract: Minimal amount of bilateral perinephric stranding. No urinary obstruction. No renal stones. Normal noncontrast appearance the bilateral adrenal glands. Stomach/Bowel: The anterior wall the stomach is well apposed against the ventral wall of the abdomen without interposed liver or transverse colon. No hiatal hernia. No evidence of enteric obstruction. No pneumoperitoneum, pneumatosis or portal venous gas. Vascular/Lymphatic: Calcified atherosclerotic plaque within normal caliber abdominal aorta. No bulky retroperitoneal or mesenteric lymphadenopathy on this noncontrast examination. Other: Diffuse body wall anasarca. Scattered foci of subcutaneous emphysema within the abdominal pannus likely at the location of subcutaneous medication administration Musculoskeletal: No acute or aggressive osseous abnormalities. IMPRESSION: 1. Gastric anatomy amenable to potential percutaneous gastrostomy tube placement as indicated. 2. Trace/small bilateral effusions with bibasilar consolidative opacities with associated air bronchograms, right greater than left, worrisome for multifocal infection/aspiration. 3. Suspected cholelithiasis. 4.  Aortic Atherosclerosis (ICD10-I70.0). Electronically Signed   By: Sandi Mariscal M.D.   On: 02/03/2018 14:09   Ir Gastrostomy Tube Mod Sed  Result Date: 02/03/2018 INDICATION: Dysphagia secondary to cardiac arrest and anoxic brain injury. Please perform percutaneous gastrostomy tube placement for enteric nutrition supplementation purposes. EXAM: PULL TROUGH GASTROSTOMY TUBE PLACEMENT COMPARISON:  Noncontrast abdominal CT - earlier same day MEDICATIONS: The patient is currently admitted to hospital receiving intravenous antibiotics; Antibiotics were administered within 1 hour of the procedure. Glucagon 1 mg IV CONTRAST:  20 mL of Isovue 300 administered into the gastric lumen. ANESTHESIA/SEDATION: None - patient is on continuous propofol drip FLUOROSCOPY TIME:  1 minutes 12  seconds (40 mGy) COMPLICATIONS: None immediate. PROCEDURE: Informed written consent was obtained from the patient's family following explanation of the procedure, risks, benefits and alternatives. A time out was performed prior to the initiation of the procedure. Ultrasound scanning was performed to demarcate the edge of the left lobe of the liver. Maximal barrier sterile technique utilized including caps, mask, sterile gowns, sterile gloves, large sterile drape, hand hygiene and Betadine prep. The left upper quadrant was sterilely prepped and draped. An oral gastric catheter was inserted into the stomach under fluoroscopy. The existing nasogastric feeding tube was removed. The left costal margin  and air opacified transverse colon were identified and avoided. Air was injected into the stomach for insufflation and visualization under fluoroscopy. Under sterile conditions a 17 gauge trocar needle was utilized to access the stomach percutaneously beneath the left subcostal margin after the overlying soft tissues were anesthetized with 1% Lidocaine with epinephrine. Needle position was confirmed within the stomach with aspiration of air and injection of small amount of contrast. A single T tack was deployed for gastropexy. Over an Amplatz guide wire, a 9-French sheath was inserted into the stomach. A snare device was utilized to capture the oral gastric catheter. The snare device was pulled retrograde from the stomach up the esophagus and out the oropharynx. The 20-French pull-through gastrostomy was connected to the snare device and pulled antegrade through the oropharynx down the esophagus into the stomach and then through the percutaneous tract external to the patient. The gastrostomy was assembled externally. Contrast injection confirms position in the stomach. Several spot radiographic images were obtained in various obliquities for documentation. The patient tolerated procedure well without immediate post  procedural complication. FINDINGS: After successful fluoroscopic guided placement, the gastrostomy tube is appropriately positioned with internal disc against the ventral aspect of the gastric lumen. IMPRESSION: Successful fluoroscopic insertion of a 20-French pull-through gastrostomy tube. The gastrostomy may be used immediately for medication administration and in 24 hrs for the initiation of feeds. Electronically Signed   By: Sandi Mariscal M.D.   On: 02/03/2018 14:24   Dg Chest Port 1 View  Result Date: 02/03/2018 CLINICAL DATA:  Ventilation. EXAM: PORTABLE CHEST 1 VIEW COMPARISON:  Radiograph of February 01, 2018. FINDINGS: Endotracheal tube is in good position. Right internal jugular catheter is unchanged. Stable cardiomegaly with central pulmonary vascular congestion is noted. Mild bibasilar edema or atelectasis is noted with small right pleural effusion. No pneumothorax is noted. Bony thorax is unremarkable. IMPRESSION: Stable cardiomegaly with central pulmonary vascular congestion. Endotracheal tube in good position. Mild bibasilar subsegmental atelectasis or edema is noted with small right pleural effusion. Electronically Signed   By: Marijo Conception, M.D.   On: 02/03/2018 10:19     Medications:   . sodium chloride Stopped (02/28/2018 1838)  . sodium chloride 10 mL/hr at 02/03/18 2000  . famotidine (PEPCID) IV Stopped (02/03/18 2254)  . meropenem (MERREM) IV Stopped (02/03/18 2254)  . propofol (DIPRIVAN) infusion 30 mcg/kg/min (02/04/18 0830)   . chlorhexidine gluconate (MEDLINE KIT)  15 mL Mouth Rinse BID  . heparin injection (subcutaneous)  5,000 Units Subcutaneous Q8H  . insulin aspart  0-20 Units Subcutaneous Q4H  . insulin glargine  5 Units Subcutaneous QHS  . ipratropium-albuterol  3 mL Nebulization Q6H  . mouth rinse  15 mL Mouth Rinse 10 times per day  . metoprolol tartrate  5 mg Intravenous Q6H  . sodium chloride flush  10-40 mL Intracatheter Q12H   sodium chloride, acetaminophen  **OR** acetaminophen, fentaNYL, heparin, hydrALAZINE, sodium chloride flush  Assessment/ Plan:  57 y.o. male  with a PMHx of diabetes mellitus type 2, hypertension, obstructive sleep apnea, who was admitted to [facility] on 01/21/2018 for evaluation of cardiac arrest that occurred at home.   1.  Acute renal failure, oliguric. 2.  Metabolic acidosis improved with dialysis.  3.  Acute respiratory failure, on the ventilator.  4.  Cardiac arrest, completed hypothermia protocol. 5.  Anemia unspecified.  Plan: Patient remains dialysis dependent at the moment.  We do plan for hemodialysis today.  Consider placing a PermCath early next week as he  has had ongoing requirement for dialysis.  Transfer tracheostomy noted next week.  Otherwise continue to monitor serum electrolytes periodically.  Further plan as patient progresses.    LOS: 7 Alaynna Kerwood 8/3/201910:49 AM

## 2018-02-04 NOTE — Progress Notes (Signed)
This note also relates to the following rows which could not be included: Temp - Cannot attach notes to unvalidated device data Pulse Rate - Cannot attach notes to unvalidated device data Resp - Cannot attach notes to unvalidated device data BP - Cannot attach notes to unvalidated device data  Hd completed  

## 2018-02-04 NOTE — Progress Notes (Signed)
[facility] NAME: [name]    MR#:  [id number]  DATE OF BIRTH:  23-Sep-1960  SUBJECTIVE:   No other acute events overnight.  Remains on a low-dose propofol drip.  Still having some myoclonic jerking movements.  Status post IR guided gastrostomy tube placement yesterday.  Plan for tracheostomy next week.  REVIEW OF SYSTEMS:    Review of Systems  Unable to perform ROS: Intubated    Nutrition: NPO Tolerating Diet: No  DRUG ALLERGIES:   Allergies  Allergen Reactions  . Enalapril Maleate Anaphylaxis  . Iodinated Diagnostic Agents Itching  . Isosorbide Mononitrate Er [Isosorbide Dinitrate] Other (See Comments)    "made me not be able to think or function"  . Canagliflozin Other (See Comments)    VITALS:  Blood pressure (!) 149/89, pulse 99, temperature 99.1 F (37.3 C), resp. rate (!) 23, height 6\' 1"  (1.854 m), weight 117.9 kg (259 lb 14.8 oz), SpO2 97 %.  PHYSICAL EXAMINATION:   Physical Exam  GENERAL:  57 y.o.-year-old patient lying in bed intubated & Sedated.  EYES: PERRL but minimally responsive.  HEENT: Head atraumatic, normocephalic. ET and OG tubes in place.   NECK:  Supple, no jugular venous distention. No thyroid enlargement, no tenderness.  LUNGS: Normal breath sounds bilaterally, no wheezing, rales, rhonchi. No use of accessory muscles of respiration.  CARDIOVASCULAR: S1, S2 normal. No murmurs, rubs, or gallops.  ABDOMEN: Soft, nontender, nondistended. Bowel sounds present. No organomegaly or mass.  EXTREMITIES: No cyanosis, clubbing or edema b/l.    NEUROLOGIC: Sedated & Intubated.  Having some myoclonic jerks and, positive clonus at times. PSYCHIATRIC: Sedated & Intubated.   SKIN: No obvious rash, lesion, or ulcer.    LABORATORY PANEL:   CBC Recent Labs  Lab 02/14/2018 0432  WBC 19.4*  HGB 8.9*  HCT 26.4*  PLT 111*    ------------------------------------------------------------------------------------------------------------------  Chemistries  Recent Labs  Lab 02/23/2018 0432 02/03/18 0509  NA 137 140  K 4.9 4.5  CL 104 105  CO2 26 29  GLUCOSE 105* 93  BUN 39* 51*  CREATININE 2.56* 3.43*  CALCIUM 8.4* 8.0*  MG 2.2  --    ------------------------------------------------------------------------------------------------------------------  Cardiac Enzymes Recent Labs  Lab 01/29/18 0210  TROPONINI 1.35*   ------------------------------------------------------------------------------------------------------------------  RADIOLOGY:  Ct Abdomen Wo Contrast  Result Date: 02/03/2018 CLINICAL DATA:  Evaluate anatomy prior to potential percutaneous gastrostomy tube placement. EXAM: CT ABDOMEN WITHOUT CONTRAST TECHNIQUE: Multidetector CT imaging of the abdomen was performed following the standard protocol without IV contrast. COMPARISON:  Abdominal radiographs-01/28/2018; chest radiograph-earlier same day FINDINGS: Lower chest: Limited visualization of the lower thorax demonstrates small/trace bilateral pleural effusions, right greater than left. Consolidative opacities are seen within the bilateral lung bases with associated air bronchograms, right greater than left. Cardiomegaly. Coronary artery calcifications. Diffuse decreased attenuation intra cardiac blood pool suggestive of anemia. Tip of dialysis catheter terminates within the distal SVC. Hepatobiliary: Hepatomegaly. There is mild nodularity of the hepatic contour. Layering debris is seen within otherwise normal-appearing gallbladder, similar to the 02/2009 examination and likely indicative of biliary sludge. No ascites. Pancreas: Normal noncontrast appearance of the pancreas Spleen: Interval development of a dystrophic calcification within a ill-defined hypoattenuating splenic lesion which appears otherwise unchanged compared to the 2010 examination and  favored to represent a minimally complex splenic cyst. Adrenals/Urinary Tract: Minimal amount of bilateral perinephric stranding. No urinary obstruction. No renal stones. Normal noncontrast appearance the bilateral adrenal glands. Stomach/Bowel: The anterior wall  the stomach is well apposed against the ventral wall of the abdomen without interposed liver or transverse colon. No hiatal hernia. No evidence of enteric obstruction. No pneumoperitoneum, pneumatosis or portal venous gas. Vascular/Lymphatic: Calcified atherosclerotic plaque within normal caliber abdominal aorta. No bulky retroperitoneal or mesenteric lymphadenopathy on this noncontrast examination. Other: Diffuse body wall anasarca. Scattered foci of subcutaneous emphysema within the abdominal pannus likely at the location of subcutaneous medication administration Musculoskeletal: No acute or aggressive osseous abnormalities. IMPRESSION: 1. Gastric anatomy amenable to potential percutaneous gastrostomy tube placement as indicated. 2. Trace/small bilateral effusions with bibasilar consolidative opacities with associated air bronchograms, right greater than left, worrisome for multifocal infection/aspiration. 3. Suspected cholelithiasis. 4.  Aortic Atherosclerosis (ICD10-I70.0). Electronically Signed   By: Sandi Mariscal M.D.   On: 02/03/2018 14:09   Ir Gastrostomy Tube Mod Sed  Result Date: 02/03/2018 INDICATION: Dysphagia secondary to cardiac arrest and anoxic brain injury. Please perform percutaneous gastrostomy tube placement for enteric nutrition supplementation purposes. EXAM: PULL TROUGH GASTROSTOMY TUBE PLACEMENT COMPARISON:  Noncontrast abdominal CT - earlier same day MEDICATIONS: The patient is currently admitted to hospital receiving intravenous antibiotics; Antibiotics were administered within 1 hour of the procedure. Glucagon 1 mg IV CONTRAST:  20 mL of Isovue 300 administered into the gastric lumen. ANESTHESIA/SEDATION: None - patient is on  continuous propofol drip FLUOROSCOPY TIME:  1 minutes 12 seconds (40 mGy) COMPLICATIONS: None immediate. PROCEDURE: Informed written consent was obtained from the patient's family following explanation of the procedure, risks, benefits and alternatives. A time out was performed prior to the initiation of the procedure. Ultrasound scanning was performed to demarcate the edge of the left lobe of the liver. Maximal barrier sterile technique utilized including caps, mask, sterile gowns, sterile gloves, large sterile drape, hand hygiene and Betadine prep. The left upper quadrant was sterilely prepped and draped. An oral gastric catheter was inserted into the stomach under fluoroscopy. The existing nasogastric feeding tube was removed. The left costal margin and air opacified transverse colon were identified and avoided. Air was injected into the stomach for insufflation and visualization under fluoroscopy. Under sterile conditions a 17 gauge trocar needle was utilized to access the stomach percutaneously beneath the left subcostal margin after the overlying soft tissues were anesthetized with 1% Lidocaine with epinephrine. Needle position was confirmed within the stomach with aspiration of air and injection of small amount of contrast. A single T tack was deployed for gastropexy. Over an Amplatz guide wire, a 9-French sheath was inserted into the stomach. A snare device was utilized to capture the oral gastric catheter. The snare device was pulled retrograde from the stomach up the esophagus and out the oropharynx. The 20-French pull-through gastrostomy was connected to the snare device and pulled antegrade through the oropharynx down the esophagus into the stomach and then through the percutaneous tract external to the patient. The gastrostomy was assembled externally. Contrast injection confirms position in the stomach. Several spot radiographic images were obtained in various obliquities for documentation. The patient  tolerated procedure well without immediate post procedural complication. FINDINGS: After successful fluoroscopic guided placement, the gastrostomy tube is appropriately positioned with internal disc against the ventral aspect of the gastric lumen. IMPRESSION: Successful fluoroscopic insertion of a 20-French pull-through gastrostomy tube. The gastrostomy may be used immediately for medication administration and in 24 hrs for the initiation of feeds. Electronically Signed   By: Sandi Mariscal M.D.   On: 02/03/2018 14:24   Dg Chest Port 1 View  Result Date: 02/03/2018 CLINICAL  DATA:  Ventilation. EXAM: PORTABLE CHEST 1 VIEW COMPARISON:  Radiograph of February 01, 2018. FINDINGS: Endotracheal tube is in good position. Right internal jugular catheter is unchanged. Stable cardiomegaly with central pulmonary vascular congestion is noted. Mild bibasilar edema or atelectasis is noted with small right pleural effusion. No pneumothorax is noted. Bony thorax is unremarkable. IMPRESSION: Stable cardiomegaly with central pulmonary vascular congestion. Endotracheal tube in good position. Mild bibasilar subsegmental atelectasis or edema is noted with small right pleural effusion. Electronically Signed   By: Marijo Conception, M.D.   On: 02/03/2018 10:19     ASSESSMENT AND PLAN:   57 year old male with past medical history of ischemic cardiomyopathy ejection fraction of 20 to 82%, chronic systolic CHF, Obstructive sleep apnea, hypertension, diabetes who presented to the hospital as he was found unresponsive and noted to be in pulseless cardiac arrest.  1.  Pulseless cardiac arrest-as a result of worsening respiratory failure and flash pulmonary edema. - Patient was resuscitated in the ER for about 17 minutes prior to regaining spontaneous circulation. - No further arrhythmias but prognosis is quite poor  2.  Respiratory failure-patient was intubated after his cardiac arrest.  Not showing any signs of meaningful neurological  recovery.  Family wants to continue aggressive care.  -plan for trach next week. -Patient has increasing secretions and thought to have pneumonia suspected aspiration.  Status post bronchoscopy yesterday but as per intensivist not much secretions were removed.  FiO2 down to 28%. -Continue IV meropenem, further pulmonary toileting as per intensivist.  3.  Acute kidney injury with oliguria-secondary to underlying cardiac arrest and cardiorenal hemodynamics. -Patient's urine output remains poor with 505 cc of urine overnight.  Patient still requiring intermittent HD. -Plan for hemodialysis today.  As per nephrology plan for PermCath placement next week as patient is requiring dialysis.  4.  Altered mental status/encephalopathy-suspected to be secondary to underlying anoxic brain injury. - Neurology was consulted, they have explained to the patient's wife about patient's poor prognosis and unlikely that he would recover from this.   -As per neurology patient does not meet fulfill criteria for brain death.  Family and wife are hopeful.  5.  Sepsis/septic shock- thought to have possible aspiration pneumonia.   Continue empiric Meropenem. - pt. Off vasopressors now.  Cultures remain (-).     6.  History of ischemic cardiomyopathy with ejection fraction of 20 to 25%- patient does not appear to be volume overloaded presently.   7.  Leukocytosis-secondary to suspected sepsis.  Follow with IV antibiotic therapy and it's improving.  Patient is status post IR guided gastrostomy tube placement yesterday and plan for trach next week.  Patient has a very poor prognosis given his multiple comorbidities and now with multiorgan failure and likely anoxic brain injury.  Family and wife want to cont. Aggressive care.     All the records are reviewed and case discussed with Care Management/Social Worker. Management plans discussed with the patient, family and they are in agreement.  CODE STATUS: Full  code  DVT Prophylaxis: Hep SQ  TOTAL TIME TAKING CARE OF THIS PATIENT: 30 minutes.   POSSIBLE D/C IN unclear, DEPENDING ON CLINICAL CONDITION and course   Henreitta Leber M.D on 02/04/2018 at 12:53 PM  Between 7am to 6pm - Pager - [phone]  After 6pm go to [email or web address] - password [email or web address] [facility]  Office  [phone]  CC: Primary care physician; Marguerita Merles, MD

## 2018-02-05 ENCOUNTER — Inpatient Hospital Stay: Payer: BLUE CROSS/BLUE SHIELD

## 2018-02-05 LAB — GLUCOSE, CAPILLARY
GLUCOSE-CAPILLARY: 108 mg/dL — AB (ref 70–99)
GLUCOSE-CAPILLARY: 136 mg/dL — AB (ref 70–99)
Glucose-Capillary: 114 mg/dL — ABNORMAL HIGH (ref 70–99)
Glucose-Capillary: 152 mg/dL — ABNORMAL HIGH (ref 70–99)
Glucose-Capillary: 121 mg/dL — ABNORMAL HIGH (ref 70–99)

## 2018-02-05 LAB — CBC WITH DIFFERENTIAL/PLATELET
Band Neutrophils: 0 %
Basophils Absolute: 0 10*3/uL (ref 0–0.1)
Basophils Relative: 0 %
Blasts: 0 %
Eosinophils Absolute: 0 10*3/uL (ref 0–0.7)
Eosinophils Relative: 0 %
HCT: 24.5 % — ABNORMAL LOW (ref 40.0–52.0)
Hemoglobin: 8.2 g/dL — ABNORMAL LOW (ref 13.0–18.0)
LYMPHS PCT: 11 %
Lymphs Abs: 2.5 10*3/uL (ref 1.0–3.6)
MCH: 31.2 pg (ref 26.0–34.0)
MCHC: 33.6 g/dL (ref 32.0–36.0)
MCV: 92.8 fL (ref 80.0–100.0)
MONO ABS: 1.6 10*3/uL — AB (ref 0.2–1.0)
MONOS PCT: 7 %
Metamyelocytes Relative: 1 %
Myelocytes: 0 %
NRBC: 0 /100{WBCs}
Neutro Abs: 18.3 10*3/uL — ABNORMAL HIGH (ref 1.4–6.5)
Neutrophils Relative %: 81 %
OTHER: 0 %
Platelets: 172 10*3/uL (ref 150–440)
Promyelocytes Relative: 0 %
RBC: 2.64 MIL/uL — ABNORMAL LOW (ref 4.40–5.90)
RDW: 13.6 % (ref 11.5–14.5)
WBC: 22.4 10*3/uL — ABNORMAL HIGH (ref 3.8–10.6)

## 2018-02-05 LAB — PHOSPHORUS
Phosphorus: 6.4 mg/dL — ABNORMAL HIGH (ref 2.5–4.6)
Phosphorus: 6.9 mg/dL — ABNORMAL HIGH (ref 2.5–4.6)

## 2018-02-05 LAB — MAGNESIUM
Magnesium: 2.4 mg/dL (ref 1.7–2.4)
Magnesium: 2.4 mg/dL (ref 1.7–2.4)

## 2018-02-05 LAB — BASIC METABOLIC PANEL
Anion gap: 10 (ref 5–15)
BUN: 68 mg/dL — ABNORMAL HIGH (ref 6–20)
CO2: 28 mmol/L (ref 22–32)
Calcium: 7.6 mg/dL — ABNORMAL LOW (ref 8.9–10.3)
Chloride: 100 mmol/L (ref 98–111)
Creatinine, Ser: 3.99 mg/dL — ABNORMAL HIGH (ref 0.61–1.24)
GFR calc Af Amer: 18 mL/min — ABNORMAL LOW (ref >60)
GFR calc non Af Amer: 15 mL/min — ABNORMAL LOW (ref >60)
Glucose, Bld: 173 mg/dL — ABNORMAL HIGH (ref 70–99)
Potassium: 4.2 mmol/L (ref 3.5–5.1)
Sodium: 138 mmol/L (ref 135–145)

## 2018-02-05 MED ORDER — ADULT MULTIVITAMIN LIQUID CH
15.0000 mL | Freq: Every day | ORAL | Status: DC
  Administered 2018-02-06 – 2018-02-09 (×4): 15 mL
  Filled 2018-02-05 (×4): qty 15

## 2018-02-05 MED ORDER — MEROPENEM 500 MG IV SOLR
500.0000 mg | INTRAVENOUS | Status: DC
  Administered 2018-02-06 – 2018-02-09 (×4): 500 mg via INTRAVENOUS
  Filled 2018-02-05 (×4): qty 500

## 2018-02-05 MED ORDER — B COMPLEX-C PO TABS
1.0000 | ORAL_TABLET | Freq: Every day | ORAL | Status: DC
  Administered 2018-02-05 – 2018-02-08 (×4): 1
  Filled 2018-02-05 (×6): qty 1

## 2018-02-05 NOTE — Progress Notes (Signed)
Follow up - Critical Care Medicine Note  Patient Details:    [name] is an 57 y.o. male.with a history of type 2 diabetes hypertension and sleep apnea who was brought to the ED by his spouse in cardiac and respiratory arrest.    Lines, Airways, Drains: Airway 7 mm (Active)  Secured at (cm) 24 cm 01/29/2018  8:04 AM  Measured From Lips 01/29/2018  8:04 AM  [place] 01/29/2018  8:04 AM  Secured By [employer] 01/29/2018  8:04 AM  Tube Holder Repositioned Yes 01/29/2018  3:38 AM  Cuff Pressure (cm H2O) 25 cm H2O 01/29/2018  8:04 AM  Site Condition Dry 01/29/2018  8:04 AM     CVC Triple Lumen 01/28/18 Left Internal jugular (Active)  Indication for Insertion or Continuance of Line Vasoactive infusions 01/29/2018  8:00 AM  Site Assessment Clean;Dry;Intact 01/29/2018  8:00 AM  Proximal Lumen Status Infusing;Flushed;Blood return noted 01/29/2018  8:00 AM  Medial Lumen Status Infusing;Flushed;Blood return noted 01/29/2018  8:00 AM  Distal Lumen Status Infusing;Flushed;Blood return noted 01/29/2018  8:00 AM  Dressing Type Transparent;Occlusive 01/29/2018  8:00 AM  Dressing Status Clean;Dry;Intact;Antimicrobial disc in place 01/29/2018  8:00 AM  Line Care Connections checked and tightened;Zeroed and calibrated;Leveled 01/29/2018  8:00 AM  Dressing Intervention New dressing 01/28/2018  4:00 AM  Dressing Change Due 02/04/18 01/29/2018  8:00 AM     Arterial Line 01/28/18 Right Radial (Active)  Site Assessment Clean;Dry;Intact 01/29/2018  8:00 AM  Line Status Pulsatile blood flow;Positional 01/29/2018  8:00 AM  Art Line Waveform Appropriate 01/29/2018  8:00 AM  Art Line Interventions Zeroed and calibrated 01/29/2018  8:00 AM  Color/Movement/Sensation Capillary refill less than 3 sec 01/29/2018  8:00 AM  Dressing Type Transparent;Occlusive 01/29/2018  8:00 AM  Dressing Status Clean;Intact;Dry;Antimicrobial disc in place 01/29/2018  8:00 AM  Interventions New dressing;Antimicrobial disc  changed 01/28/2018  4:00 AM  Dressing Change Due 02/04/18 01/29/2018  8:00 AM     Urethral Catheter THAHN, ed tech Straight-tip;Temperature probe 16 Fr. (Active)  Indication for Insertion or Continuance of Catheter Unstable critical patients (first 24-48 hours) 01/29/2018  8:00 AM  Site Assessment Clean;Intact;Dry 01/29/2018  8:00 AM  Catheter Maintenance Bag below level of bladder;Catheter secured;Drainage bag/tubing not touching floor 01/29/2018  8:00 AM  Collection Container Standard drainage bag 01/29/2018  8:00 AM  Securement Method Other (Comment) 01/29/2018  8:00 AM  Output (mL) 0 mL 01/29/2018  8:00 AM    Anti-infectives:  Anti-infectives (From admission, onward)   Start     Dose/Rate Route Frequency Ordered Stop   02/03/18 1200  meropenem (MERREM) 1 g in sodium chloride 0.9 % 100 mL IVPB     1 g 200 mL/hr over 30 Minutes Intravenous Every 12 hours 02/03/18 1033     02/14/2018 1800  ceFEPIme (MAXIPIME) 2 g in sodium chloride 0.9 % 100 mL IVPB  Status:  Discontinued    Note to Pharmacy:  7 day stop date. Thank you   2 g 200 mL/hr over 30 Minutes Intravenous Every 12 hours 02/15/2018 0753 02/03/18 1031   01/30/18 2200  vancomycin (VANCOCIN) IVPB 1000 mg/200 mL premix  Status:  Discontinued     1,000 mg 200 mL/hr over 60 Minutes Intravenous Every 24 hours 01/30/18 2043 03/01/2018 0753   01/30/18 0500  vancomycin (VANCOCIN) 1,250 mg in sodium chloride 0.9 % 250 mL IVPB  Status:  Discontinued     1,250 mg 166.7 mL/hr over 90 Minutes Intravenous Every 18 hours  01/29/18 0956 01/30/18 0308   01/30/18 0307  vancomycin (VANCOCIN) 1,250 mg in sodium chloride 0.9 % 250 mL IVPB  Status:  Discontinued     1,250 mg 166.7 mL/hr over 90 Minutes Intravenous As needed 01/30/18 0308 02/01/18 1549   01/28/18 1400  vancomycin (VANCOCIN) 1,250 mg in sodium chloride 0.9 % 250 mL IVPB  Status:  Discontinued     1,250 mg 166.7 mL/hr over 90 Minutes Intravenous Every 18 hours 01/28/18 0544 01/29/18 0956   01/28/18  0600  ceFEPIme (MAXIPIME) 2 g in sodium chloride 0.9 % 100 mL IVPB  Status:  Discontinued     2 g 200 mL/hr over 30 Minutes Intravenous Every 12 hours 01/28/18 0544 03/03/2018 0753   01/28/18 0545  vancomycin (VANCOCIN) 1,250 mg in sodium chloride 0.9 % 250 mL IVPB     1,250 mg 166.7 mL/hr over 90 Minutes Intravenous  Once 01/28/18 0544 01/28/18 1020      Microbiology: Results for orders placed or performed during the hospital encounter of 01/26/2018  MRSA PCR Screening     Status: None   Collection Time: 01/28/18  5:00 AM  Result Value Ref Range Status   MRSA by PCR NEGATIVE NEGATIVE Final    Comment:        The GeneXpert MRSA Assay (FDA approved for NASAL specimens only), is one component of a comprehensive MRSA colonization surveillance program. It is not intended to diagnose MRSA infection nor to guide or monitor treatment for MRSA infections. Performed at [facility], [address], [place], [place] [address]   Urine Culture     Status: None   Collection Time: 01/28/18  6:16 AM  Result Value Ref Range Status   Specimen Description   Final    URINE, RANDOM Performed at [facility], [address], [place], [place] [address]    Special Requests   Final    NONE Performed at [facility], [address], [place], [place] [address]    Culture   Final    NO GROWTH Performed at [facility], [place] [address] [place], [place] [address]    Report Status 01/29/2018 FINAL  Final  Culture, blood (Routine X 2) w Reflex to ID Panel     Status: None   Collection Time: 01/28/18  6:45 AM  Result Value Ref Range Status   Specimen Description BLOOD LAC  Final   Special Requests   Final    BOTTLES DRAWN AEROBIC AND ANAEROBIC Blood Culture adequate volume   Culture   Final    NO GROWTH 5 DAYS Performed at [facility], [address] [place], [place] [address]    Report Status 02/17/2018 FINAL  Final  Culture, blood (Routine X 2) w  Reflex to ID Panel     Status: None   Collection Time: 01/28/18  6:56 AM  Result Value Ref Range Status   Specimen Description BLOOD RAC  Final   Special Requests   Final    BOTTLES DRAWN AEROBIC AND ANAEROBIC Blood Culture results may not be optimal due to an inadequate volume of blood received in culture bottles   Culture   Final    NO GROWTH 5 DAYS Performed at [facility], [address], [place],  [address]    Report Status 02/15/2018 FINAL  Final  Culture, respiratory (non-expectorated)     Status: None   Collection Time: 01/28/18 11:24 AM  Result Value Ref Range Status   Specimen  Description   Final    TRACHEAL ASPIRATE Performed at [facility], [address] [place], [place] [address]    Special Requests   Final    NONE Performed at [facility], [place], [place] [address]    Gram Stain   Final    RARE WBC PRESENT,BOTH PMN AND MONONUCLEAR RARE GRAM POSITIVE COCCI IN PAIRS    Culture   Final    RARE Consistent with normal respiratory flora. Performed at [facility], [place] [address], [place], [place] [address]    Report Status 01/30/2018 FINAL  Final  Culture, respiratory (non-expectorated)     Status: None (Preliminary result)   Collection Time: 02/03/18  7:53 AM  Result Value Ref Range Status   Specimen Description   Final    TRACHEAL ASPIRATE Performed at [facility], [address], [place], [place] [address]    Special Requests   Final    NONE Performed at [facility], [address] [place], [place] [address]    Gram Stain   Final    ABUNDANT WBC PRESENT, PREDOMINANTLY PMN FEW GRAM NEGATIVE RODS    Culture   Final    CULTURE REINCUBATED FOR BETTER GROWTH Performed at [facility], [place] [address], [place], [place] [address]    Report Status PENDING  Incomplete   Events:   Studies: Ct Abdomen Wo Contrast  Result Date: 02/03/2018 CLINICAL DATA:  Evaluate  anatomy prior to potential percutaneous gastrostomy tube placement. EXAM: CT ABDOMEN WITHOUT CONTRAST TECHNIQUE: Multidetector CT imaging of the abdomen was performed following the standard protocol without IV contrast. COMPARISON:  Abdominal radiographs-01/28/2018; chest radiograph-earlier same day FINDINGS: Lower chest: Limited visualization of the lower thorax demonstrates small/trace bilateral pleural effusions, right greater than left. Consolidative opacities are seen within the bilateral lung bases with associated air bronchograms, right greater than left. Cardiomegaly. Coronary artery calcifications. Diffuse decreased attenuation intra cardiac blood pool suggestive of anemia. Tip of dialysis catheter terminates within the distal SVC. Hepatobiliary: Hepatomegaly. There is mild nodularity of the hepatic contour. Layering debris is seen within otherwise normal-appearing gallbladder, similar to the 02/2009 examination and likely indicative of biliary sludge. No ascites. Pancreas: Normal noncontrast appearance of the pancreas Spleen: Interval development of a dystrophic calcification within a ill-defined hypoattenuating splenic lesion which appears otherwise unchanged compared to the 2010 examination and favored to represent a minimally complex splenic cyst. Adrenals/Urinary Tract: Minimal amount of bilateral perinephric stranding. No urinary obstruction. No renal stones. Normal noncontrast appearance the bilateral adrenal glands. Stomach/Bowel: The anterior wall the stomach is well apposed against the ventral wall of the abdomen without interposed liver or transverse colon. No hiatal hernia. No evidence of enteric obstruction. No pneumoperitoneum, pneumatosis or portal venous gas. Vascular/Lymphatic: Calcified atherosclerotic plaque within normal caliber abdominal aorta. No bulky retroperitoneal or mesenteric lymphadenopathy on this noncontrast examination. Other: Diffuse body wall anasarca. Scattered foci of  subcutaneous emphysema within the abdominal pannus likely at the location of subcutaneous medication administration Musculoskeletal: No acute or aggressive osseous abnormalities. IMPRESSION: 1. Gastric anatomy amenable to potential percutaneous gastrostomy tube placement as indicated. 2. Trace/small bilateral effusions with bibasilar consolidative opacities with associated air bronchograms, right greater than left, worrisome for multifocal infection/aspiration. 3. Suspected cholelithiasis. 4.  Aortic Atherosclerosis (ICD10-I70.0). Electronically Signed   By: Sandi Mariscal M.D.   On: 02/03/2018 14:09   Dg Abd 1 View  Result Date: 01/28/2018 CLINICAL DATA:  NG tube placement EXAM: ABDOMEN -  1 VIEW COMPARISON:  01/20/2018 FINDINGS: No enteric tube is demonstrated within the field of view. Mild gaseous distention of the stomach. IMPRESSION: No enteric tube visualized. Electronically Signed   By: Lucienne Capers M.D.   On: 01/28/2018 05:53   Dg Abd 1 View  Result Date: 01/20/2018 CLINICAL DATA:  Encounter for NG tube placement EXAM: ABDOMEN - 1 VIEW COMPARISON:  01/20/2018 FINDINGS: Malpositioned nasogastric tube, coiled backwards overlying the mid esophagus with tip excluded from view Endotracheal tube overlies the tracheal air column with tip superior to the carina. No pneumothorax. Pacer leads overlie the left ventricular apex. Pulmonary vasculature remains indistinct with cephalization of flow. Suspected trace right-sided pleural effusion. No definite left-sided pleural effusion, though the left costophrenic angle is excluded from view. Nonobstructive bowel gas pattern. No pneumoperitoneum, pneumatosis or portal venous gas. IMPRESSION: 1. Malpositioned nasogastric tube.  Repositioning is advised. 2. Similar findings of pulmonary edema and trace right-sided pleural effusion. 3. Nonobstructive bowel gas pattern. Electronically Signed   By: Sandi Mariscal M.D.   On: 01/20/2018 14:03   Dg Abd 1 View  Result Date:  01/20/2018 CLINICAL DATA:  NG tube placement EXAM: ABDOMEN - 1 VIEW COMPARISON:  None. FINDINGS: There is no nasogastric tube identified in the lower chest or abdomen. There is mild gaseous distension of the colon and stomach. There is no evidence of pneumoperitoneum, portal venous gas or pneumatosis. There are no pathologic calcifications along the expected course of the ureters. The osseous structures are unremarkable. IMPRESSION: No nasogastric tube identified in the lower chest or abdomen. Electronically Signed   By: Kathreen Devoid   On: 01/20/2018 12:42   Dg Abd 1 View  Result Date: 01/20/2018 CLINICAL DATA:  Abdominal pain. EXAM: ABDOMEN - 1 VIEW COMPARISON:  None. FINDINGS: A right-sided pleural effusion is suspected layering posteriorly. Pacer paddles are noted on the chest. The bowel gas pattern is unremarkable. Air and stool scattered throughout the colon. A few scattered air-filled small bowel loops. No obvious free air. A right femoral line is noted. Bony structures are unremarkable. IMPRESSION: Suspect right pleural effusion. No plain film findings for an acute abdominal process. Electronically Signed   By: Marijo Sanes M.D.   On: 01/20/2018 11:22   Ct Head Wo Contrast  Result Date: 01/30/2018 CLINICAL DATA:  Anoxic brain injury EXAM: CT HEAD WITHOUT CONTRAST TECHNIQUE: Contiguous axial images were obtained from the base of the skull through the vertex without intravenous contrast. COMPARISON:  Head CT 01/28/2018 FINDINGS: Brain: There is diffuse blurring of the gray-white interface, slightly greater than on the prior examination. There is crowding of the basal cisterns. No acute hemorrhage or other extra-axial collection. Vascular: No abnormal hyperdensity of the major intracranial arteries or dural venous sinuses. No intracranial atherosclerosis. Skull: The visualized skull base, calvarium and extracranial soft tissues are normal. Sinuses/Orbits: No fluid levels or advanced mucosal thickening  of the visualized paranasal sinuses. No mastoid or middle ear effusion. The orbits are normal. IMPRESSION: Diffuse blurring of the gray-white interface, compatible with global edema, slightly progressed compared to 01/28/2018. Mild worsening of basal cisternal crowding. Electronically Signed   By: Ulyses Jarred M.D.   On: 01/30/2018 13:17   Ct Head Wo Contrast  Result Date: 01/28/2018 CLINICAL DATA:  Cardiac arrest. History of hypertension and diabetes. EXAM: CT HEAD WITHOUT CONTRAST TECHNIQUE: Contiguous axial images were obtained from the base of the skull through the vertex without intravenous contrast. COMPARISON:  CT neck January 20, 2018. FINDINGS: BRAIN: No intraparenchymal hemorrhage,  mass effect nor midline shift. Mild blurring of the gray-white matter differentiation. Patchy supratentorial white matter hypodensities. The ventricles and sulci are normal. No acute large vascular territory infarcts. No abnormal extra-axial fluid collections. Basal cisterns are patent. VASCULAR: Mild calcific atherosclerosis carotid bifurcations. SKULL/SOFT TISSUES: No skull fracture. No significant soft tissue swelling. ORBITS/SINUSES: The included ocular globes and orbital contents are normal.Mild paranasal sinus mucosal thickening without air-fluid levels. Included mastoid air cells are well aerated. OTHER: Life-support lines in place. IMPRESSION: 1. 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 2. Mild chronic small vessel ischemic changes. 3. Mild atherosclerosis. 4. Acute findings discussed with and reconfirmed by Dr.ALLISON WEBSTER on 01/28/2018 at 1:00 am. Electronically Signed   By: Elon Alas M.D.   On: 01/28/2018 01:04   Ct Soft Tissue Neck Wo Contrast  Result Date: 01/20/2018 CLINICAL DATA:  57 y/o  M; thyroid nodule. EXAM: CT NECK WITHOUT CONTRAST TECHNIQUE: Multidetector CT imaging of the neck was performed following the standard protocol without intravenous contrast. COMPARISON:   None. FINDINGS: Pharynx and larynx: Debris within the airways from intubation. Salivary glands: No inflammation, mass, or stone. Thyroid: 5.6 cm nodule within the left lobe of the thyroid. 18 mm nodule in the right lobe of thyroid. Lymph nodes: None enlarged or abnormal density. Vascular: Negative. Limited intracranial: Negative. Visualized orbits: Negative. Mastoids and visualized paranasal sinuses: Mild mucosal thickening of the left maxillary sinus. Normal aeration of the mastoid air cells. Skeleton: No acute or aggressive process. Dental disease with multiple periapical cysts and absent crowns. Mild cervical spondylosis greatest at the C5-6 level. Upper chest: Endotracheal tube tip 3.4 cm above the carina. Moderate bilateral pleural effusions and dependent atelectasis of the lungs. Other: None. IMPRESSION: 1. Thyroid nodules measuring up to 5.6 cm in the left lobe of thyroid. Mass effect from the large left lobe of thyroid nodule displaces the airway rightward. 2. Endotracheal tube tip 3.4 cm above the carina. 3. Moderate bilateral pleural effusion with dependent atelectasis of the lungs. Electronically Signed   By: Kristine Garbe M.D.   On: 01/20/2018 04:44   Ir Gastrostomy Tube Mod Sed  Result Date: 02/03/2018 INDICATION: Dysphagia secondary to cardiac arrest and anoxic brain injury. Please perform percutaneous gastrostomy tube placement for enteric nutrition supplementation purposes. EXAM: PULL TROUGH GASTROSTOMY TUBE PLACEMENT COMPARISON:  Noncontrast abdominal CT - earlier same day MEDICATIONS: The patient is currently admitted to hospital receiving intravenous antibiotics; Antibiotics were administered within 1 hour of the procedure. Glucagon 1 mg IV CONTRAST:  20 mL of Isovue 300 administered into the gastric lumen. ANESTHESIA/SEDATION: None - patient is on continuous propofol drip FLUOROSCOPY TIME:  1 minutes 12 seconds (40 mGy) COMPLICATIONS: None immediate. PROCEDURE: Informed written  consent was obtained from the patient's family following explanation of the procedure, risks, benefits and alternatives. A time out was performed prior to the initiation of the procedure. Ultrasound scanning was performed to demarcate the edge of the left lobe of the liver. Maximal barrier sterile technique utilized including caps, mask, sterile gowns, sterile gloves, large sterile drape, hand hygiene and Betadine prep. The left upper quadrant was sterilely prepped and draped. An oral gastric catheter was inserted into the stomach under fluoroscopy. The existing nasogastric feeding tube was removed. The left costal margin and air opacified transverse colon were identified and avoided. Air was injected into the stomach for insufflation and visualization under fluoroscopy. Under sterile conditions a 17 gauge trocar needle was utilized to access the stomach percutaneously beneath the left subcostal margin  after the overlying soft tissues were anesthetized with 1% Lidocaine with epinephrine. Needle position was confirmed within the stomach with aspiration of air and injection of small amount of contrast. A single T tack was deployed for gastropexy. Over an Amplatz guide wire, a 9-French sheath was inserted into the stomach. A snare device was utilized to capture the oral gastric catheter. The snare device was pulled retrograde from the stomach up the esophagus and out the oropharynx. The 20-French pull-through gastrostomy was connected to the snare device and pulled antegrade through the oropharynx down the esophagus into the stomach and then through the percutaneous tract external to the patient. The gastrostomy was assembled externally. Contrast injection confirms position in the stomach. Several spot radiographic images were obtained in various obliquities for documentation. The patient tolerated procedure well without immediate post procedural complication. FINDINGS: After successful fluoroscopic guided placement,  the gastrostomy tube is appropriately positioned with internal disc against the ventral aspect of the gastric lumen. IMPRESSION: Successful fluoroscopic insertion of a 20-French pull-through gastrostomy tube. The gastrostomy may be used immediately for medication administration and in 24 hrs for the initiation of feeds. Electronically Signed   By: Sandi Mariscal M.D.   On: 02/03/2018 14:24   Dg Chest Port 1 View  Result Date: 02/03/2018 CLINICAL DATA:  Ventilation. EXAM: PORTABLE CHEST 1 VIEW COMPARISON:  Radiograph of February 01, 2018. FINDINGS: Endotracheal tube is in good position. Right internal jugular catheter is unchanged. Stable cardiomegaly with central pulmonary vascular congestion is noted. Mild bibasilar edema or atelectasis is noted with small right pleural effusion. No pneumothorax is noted. Bony thorax is unremarkable. IMPRESSION: Stable cardiomegaly with central pulmonary vascular congestion. Endotracheal tube in good position. Mild bibasilar subsegmental atelectasis or edema is noted with small right pleural effusion. Electronically Signed   By: Marijo Conception, M.D.   On: 02/03/2018 10:19   Dg Chest Port 1 View  Result Date: 02/01/2018 CLINICAL DATA:  Cardiac and respiratory arrest.  Post intubation. EXAM: PORTABLE CHEST 1 VIEW COMPARISON:  01/31/2018 FINDINGS: Endotracheal tube 6.9 cm above the carina. Advancement with 3-4 cm may be considered. Central lines in stable position. Enlarged globular heart.  Mediastinal contours appear intact. Bilateral pleural effusions and interstitial pulmonary edema. More focal airspace consolidation in the right lung base cannot be excluded. Osseous structures are without acute abnormality. Soft tissues are grossly normal. IMPRESSION: Endotracheal tube 6.9 cm above the carina. Advancement with 3-4 cm may be considered. Enlarged globular heart. This may be due to cardiomegaly or pericardial effusion. Interstitial pulmonary edema with moderate in size bilateral  pleural effusions. More focal airspace consolidation in the right lung base cannot be excluded. Electronically Signed   By: Fidela Salisbury M.D.   On: 02/01/2018 09:36   Dg Chest Port 1 View  Result Date: 01/31/2018 CLINICAL DATA:  Check endotracheal tube EXAM: PORTABLE CHEST 1 VIEW COMPARISON:  01/30/2018 FINDINGS: Endotracheal tube is noted at the thoracic inlet. Bilateral jugular catheters are again noted and stable. Cardiac shadow is stable but enlarged. Persistent right-sided infiltrate and effusion is seen. The left lung is clear. IMPRESSION: Right-sided effusion and infiltrate stable from the prior exam. Tubes and lines as described. Electronically Signed   By: Inez Catalina M.D.   On: 01/31/2018 10:49   Dg Chest Port 1 View  Result Date: 01/30/2018 CLINICAL DATA:  Central line placement EXAM: PORTABLE CHEST 1 VIEW COMPARISON:  01/30/2018 at 0918 hours FINDINGS: Endotracheal tube terminates 6 cm above the carina. Right IJ dual lumen  catheter terminates in the lower SVC. Left IJ venous catheter terminates in the left brachiocephalic vein. Cardiomegaly with mild to moderate interstitial edema. Multifocal pneumonia is also possible. Suspected bilateral pleural effusions, right greater than left. No pneumothorax. IMPRESSION: Endotracheal tube terminates 6 cm above the carina. Right IJ catheter terminates in the lower SVC. Left IJ catheter terminates in the left brachiocephalic vein. Cardiomegaly with suspected mild to moderate interstitial edema and bilateral pleural effusions. Multifocal pneumonia is also possible. Electronically Signed   By: Julian Hy M.D.   On: 01/30/2018 21:42   Dg Chest Port 1 View  Result Date: 01/30/2018 CLINICAL DATA:  57 y.o. male.with a history of type 2 diabetes hypertension and sleep apnea who was brought to the ED by his spouse in cardiac and respiratory arrest. now on vent, smoker EXAM: PORTABLE CHEST 1 VIEW COMPARISON:  Chest x-rays dated 01/28/2018 and  01/24/2018. FINDINGS: Study is hypoinspiratory. There is continued bilateral pulmonary edema pattern, not significantly changed compared to the most recent study of 01/28/2018, increased compared to the earlier study of 01/24/2018. Given the low lung volumes, heart size and mediastinal contours appear stable. Endotracheal tube tip is at the level of the clavicles, approximately 6 cm above the carina. LEFT jugular vein central line is stable in position with tip at the level of the upper SVC. No pneumothorax seen. Probable small bilateral pleural effusions. IMPRESSION: 1. Continued bilateral pulmonary edema pattern, not significantly changed compared to most recent chest x-ray of 01/28/2018. 2. Probable small bilateral pleural effusions. 3. Cardiomegaly. 4. Support apparatus appears stable in position. Endotracheal tube tip is at the level of the clavicles, approximately 6 cm above the carina. Electronically Signed   By: Franki Cabot M.D.   On: 01/30/2018 09:37   Dg Chest Port 1 View  Result Date: 01/28/2018 CLINICAL DATA:  Line placement EXAM: PORTABLE CHEST 1 VIEW COMPARISON:  01/28/2018 FINDINGS: Endotracheal tube measures 6.8 cm above the carina. A left central venous catheter has been placed with tip over the confluence of the brachiocephalic vein and IVC. Shallow inspiration. Cardiac enlargement. Bilateral perihilar infiltrates. Probable small right pleural effusion. No pneumothorax is visible. IMPRESSION: Appliances appear in satisfactory position. Cardiac enlargement with bilateral perihilar infiltrates. Small right pleural effusion. No pneumothorax. Electronically Signed   By: Lucienne Capers M.D.   On: 01/28/2018 05:52   Dg Chest Portable 1 View  Result Date: 01/28/2018 CLINICAL DATA:  Short of breath EXAM: PORTABLE CHEST 1 VIEW COMPARISON:  01/24/2018, 01/21/2018 FINDINGS: Interval intubation, tip of the endotracheal tube is about 4 cm superior to the carina. Worsening bilateral interstitial and  alveolar disease. No large effusion. Stable mild cardiomegaly. No pneumothorax. IMPRESSION: 1. Endotracheal tube tip about 3.5 cm superior to the carina. 2. Cardiomegaly. Interim worsening of bilateral interstitial and alveolar airspace disease. Electronically Signed   By: Donavan Foil M.D.   On: 01/28/2018 00:29   Dg Chest Port 1 View  Result Date: 01/24/2018 CLINICAL DATA:  Respiratory failure EXAM: PORTABLE CHEST 1 VIEW COMPARISON:  01/21/2018, 01/20/2018, 12/13/2017 FINDINGS: Removal of the endotracheal tube. Slight increased upper lobe ground-glass opacity. Improved aeration at the bases. Stable slightly enlarged cardiomediastinal silhouette. No pneumothorax. IMPRESSION: 1. Removal of endotracheal tube 2. Improved aeration at the bilateral lung bases. Slight interval increase in bilateral upper lobe ground-glass opacity. 3. Stable mild cardiomegaly Electronically Signed   By: Donavan Foil M.D.   On: 01/24/2018 03:58   Dg Chest Port 1 View  Result Date: 01/21/2018 CLINICAL DATA:  Acute respiratory failure. EXAM: PORTABLE CHEST 1 VIEW COMPARISON:  01/20/2018 FINDINGS: The endotracheal tube is in good position at the mid tracheal level. The lungs demonstrate improved aeration with resolving edema and atelectasis. Probable persistent small layering right pleural effusion. IMPRESSION: Stable position of the endotracheal tube. Improved lung aeration with resolving edema and atelectasis. Electronically Signed   By: Marijo Sanes M.D.   On: 01/21/2018 08:36   Dg Chest Portable 1 View  Result Date: 01/20/2018 CLINICAL DATA:  57 y/o  M; status post intubation. EXAM: PORTABLE CHEST 1 VIEW COMPARISON:  01/20/2018 chest radiograph FINDINGS: Stable cardiomegaly given projection and technique. Interstitial and alveolar pulmonary edema. Probable small effusions. Endotracheal tube tip 4.5 cm above the carina. Transcutaneous pacing pads noted. Bones are unremarkable. IMPRESSION: 1. Endotracheal tube tip 4.5 cm  above the carina. 2. Stable cardiomegaly and pulmonary edema. Probable small effusions. Electronically Signed   By: Kristine Garbe M.D.   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 Stable cardiomegaly. Nonaneurysmal thoracic aorta. Diffuse bilateral airspace disease and vascular congestion consistent with pulmonary edema. No significant effusion or pneumothorax. No acute osseous abnormality. IMPRESSION: Stable cardiomegaly with pulmonary edema. Superimposed pneumonia is not entirely excluded but believed less likely. Electronically Signed   By: Ashley Royalty M.D.   On: 01/20/2018 02:56   Dg Addison Bailey G Tube Plc W/fl W/rad  Result Date: 01/20/2018 CLINICAL DATA:  Patient presents for Dobbhoff tube placement. EXAM: NASO G TUBE PLACEMENT WITH FL AND WITH RAD FLUOROSCOPY TIME:  Fluoroscopy Time:  0.3 minute Radiation Exposure Index (if provided by the fluoroscopic device): 1.3 mGy Number of Acquired Spot Images: 0 COMPARISON:  None. FINDINGS: Multiple attempts at placing a Dobbhoff tube were made through the right and left nare. The tip of the Dobbhoff tube would not progressed beyond the level of the mid trachea on repeated attempts. Attempts were made at advancing the Dobbhoff tube following deflating the tracheostomy cuff, but the Dobbhoff tube could not advance any further. Examination was terminated at this time. IMPRESSION: 1. Multiple unsuccessful attempts at placing a Dobbhoff tube which would not progressed beyond the level of the mid trachea Electronically Signed   By: Kathreen Devoid   On: 01/20/2018 16:41    Consults: Treatment Team:  Teodoro Spray, MD Leotis Pain, MD Anthonette Legato, MD Carloyn Manner, MD   Subjective:    Overnight Issues:no issues overnight, diminished  secretions  Objective:  Vital signs for last 24 hours: Temp:  [97.5 F (36.4 C)-99.1 F (37.3 C)] 98.2 F (36.8 C) (08/04 0600) Pulse Rate:  [82-99] 83 (08/04 0700) Resp:  [17-27] 27 (08/04 0700) BP: (120-168)/(68-139) 126/69 (08/04 0600) SpO2:  [92 %-100 %] 97 % (08/04 0700) FiO2 (%):  [28 %] 28 % (08/04 0700)  Hemodynamic parameters for last 24 hours:    Intake/Output from previous day: 08/03 0701 - 08/04 0700 In: 1967.5 [I.V.:1067.5; NG/GT:500; IV Piggyback:400] Out: 2260 [Urine:760]  Intake/Output this shift: No intake/output data recorded.  Vent settings for last 24 hours: Vent Mode: PRVC FiO2 (%):  [28 %] 28 % Set Rate:  [25 bmp] 25 bmp Vt Set:  [500 mL] 500 mL PEEP:  [5 cmH20] 5 cmH20  Physical Exam:  Vital signs: . Please see the above listed vital signs HEENT: Patient is orally intubated, trachea is midline,  no accessory muscle utilization Cardiovascular: Regular rate and rhythm Pulmonary: Coarse rhonchi Abdominal: Hypoactive bowel sounds, soft exam, g-tube in place Extremities: No clubbing cyanosis or edema noted Neurologic: Patient does have occasional myoclonus otherwise unchanged neurologic exam   Assessment/Plan:   Status post cardiac arrest.  Status post targeted temperature management. No significant improvement in neurologic status, anoxic brain injury Appreciate neurology. Pending tracheostomy,   History of severe cardiomyopathy with systolic failure. Measured ejection fraction of 15% on echocardiogram yesterday  Renal failure. Presently on HD greatly appreciate nephrology's assistance  Leukocytosis. On meropenem, will complete a seven-day course. Pending results of blood cultures and respiratory cultures  Anemia. No evidence of active bleeding  Goiter.   Critical care time 30 minutes   Emerlyn Mehlhoff 02/05/2018  *Care during the described time interval was provided by me and/or other providers on the critical care team.  I have reviewed  this patient's available data, including medical history, events of note, physical examination and test results as part of my evaluation. Patient ID: SHANDELL JALLOW, male   DOB: 11-27-1960, 57 y.o.   MRN: [id number]  Patient ID: MJ WILLIS, male   DOB: [birth year], 57 y.o.   MRN: [id number] Patient ID: RION CATALA, male   DOB: 11/01/1960, 57 y.o.   MRN: [id number] Patient ID: JONATHEN RATHMAN, male   DOB: 02/23/1961, 57 y.o.   MRN: [id number] Patient ID: KHING BELCHER, male   DOB: February 15, 1961, 57 y.o.   MRN: [id number] Patient ID: LANG ZINGG, male   DOB: 12-11-1960, 57 y.o.   MRN: [id number]

## 2018-02-05 NOTE — Progress Notes (Signed)
Patient had 2 BMs this shift. Had to turn up Propofol due to agitation seen with eyes opening and upward gaze along with shaking feet. Tolerating ped tube feeding. No other concerns at this time.

## 2018-02-05 NOTE — Progress Notes (Signed)
Pharmacy Antibiotic Note  [name] is a 57 y.o. male admitted on 01/22/2018 s/p cardiac arrest and underwent targeted temperature management. Patient requiring mechanical ventilation; now off of CRRT.   Pharmacy has been consulted for meropenem dosing for sepsis. Patient with recent history requiring mechanical ventilation and discharged on 7/23 - patient received both vancomycin and cefepime during admission.   Plan: CRRT changed to HD. Meropenem dose modified from 1 gm IV 12H to 500 mg IV Q24H for HD dosing.   Height: 6\' 1"  (185.4 cm) Weight: 257 lb 15 oz (117 kg) IBW/kg (Calculated) : 79.9  Temp (24hrs), Avg:98.1 F (36.7 C), Min:97.5 F (36.4 C), Max:99.1 F (37.3 C)  Recent Labs  Lab 01/30/18 0355  01/30/18 1036  01/30/18 1616  02/01/18 0446  02/01/18 1645 02/01/18 2021 02/04/2018 0012 02/14/2018 0432 02/03/18 0509 02/05/18 1139  WBC  --   --   --   --   --   --  23.8*  --   --   --   --  19.4*  --  22.4*  CREATININE 4.05*   < >  --    < >  --    < > 2.87*   < > 2.64*  --  2.39* 2.56* 3.43* 3.99*  LATICACIDVEN 2.6*  --  1.8  --   --   --   --   --   --   --   --   --   --   --   VANCORANDOM  --   --   --   --  23  --   --   --   --  16  --   --   --   --    < > = values in this interval not displayed.    Estimated Creatinine Clearance: 27.4 mL/min (A) (by C-G formula based on SCr of 3.99 mg/dL (H)).    Allergies  Allergen Reactions  . Enalapril Maleate Anaphylaxis  . Iodinated Diagnostic Agents Itching  . Isosorbide Mononitrate Er [Isosorbide Dinitrate] Other (See Comments)    "made me not be able to think or function"  . Canagliflozin Other (See Comments)    Antimicrobials this admission: Cefepime 7/19 >> 7/23, 7/27 >> 8/2 Vancomycin 7/19 >> 7/22, 7/27 >> 7/31 Meropenem 8/2 >>  Dose adjustments this admission: 7/29 vancomycin held due to vancomycin level of 32 7/29 CRRT initiated. Patients cefepime and vancomycin adjusted  Microbiology results: 7/27  BCx: no growth x 5 days  7/27 UCx: no growth  7/27 Sputum: normal flora   7/27 MRSA PCR: negative  8/2 Tracheal Aspirate: few gram negative rods  Thank you for allowing pharmacy to be a part of this patient's care.  Laural Benes, PharmD, BCPS Clinical Pharmacist 02/05/2018 12:39 PM

## 2018-02-05 NOTE — Progress Notes (Signed)
[facility] NAME: [name]    MR#:  [id number]  DATE OF BIRTH:  Jul 12, 1960  SUBJECTIVE:   No other acute events overnight.  Remains on a low-dose propofol drip.  This post IR guided gastrostomy tube placement 2 days ago and now on tube feeds and tolerating it well.  Plan for trach next week.  REVIEW OF SYSTEMS:    Review of Systems  Unable to perform ROS: Intubated    Nutrition: Tube feeds Tolerating Diet: Yes  DRUG ALLERGIES:   Allergies  Allergen Reactions  . Enalapril Maleate Anaphylaxis  . Iodinated Diagnostic Agents Itching  . Isosorbide Mononitrate Er [Isosorbide Dinitrate] Other (See Comments)    "made me not be able to think or function"  . Canagliflozin Other (See Comments)    VITALS:  Blood pressure 121/64, pulse 88, temperature 97.9 F (36.6 C), resp. rate (!) 25, height 6\' 1"  (1.854 m), weight 117 kg (257 lb 15 oz), SpO2 97 %.  PHYSICAL EXAMINATION:   Physical Exam  GENERAL:  57 y.o.-year-old patient lying in bed intubated & Sedated.  EYES: PERRL but minimally responsive.  HEENT: Head atraumatic, normocephalic. ET and OG tubes in place.   NECK:  Supple, no jugular venous distention. No thyroid enlargement, no tenderness.  LUNGS: Normal breath sounds bilaterally, no wheezing, rales, rhonchi. No use of accessory muscles of respiration.  CARDIOVASCULAR: S1, S2 normal. No murmurs, rubs, or gallops.  ABDOMEN: Soft, nontender, nondistended. Bowel sounds present. No organomegaly or mass.  + G-tube in place.  EXTREMITIES: No cyanosis, clubbing or edema b/l.    NEUROLOGIC: Sedated & Intubated.  Having some myoclonic jerks occasionally, positive clonus at times. PSYCHIATRIC: Sedated & Intubated.   SKIN: No obvious rash, lesion, or ulcer.    LABORATORY PANEL:   CBC Recent Labs  Lab 02/05/18 1139  WBC 22.4*  HGB 8.2*  HCT 24.5*  PLT 172    ------------------------------------------------------------------------------------------------------------------  Chemistries  Recent Labs  Lab 02/05/18 1139  NA 138  K 4.2  CL 100  CO2 28  GLUCOSE 173*  BUN 68*  CREATININE 3.99*  CALCIUM 7.6*  MG 2.4   ------------------------------------------------------------------------------------------------------------------  Cardiac Enzymes No results for input(s): TROPONINI in the last 168 hours. ------------------------------------------------------------------------------------------------------------------  RADIOLOGY:  Dg Chest Port 1 View  Result Date: 02/05/2018 CLINICAL DATA:  Mechanically assisted ventilation. EXAM: PORTABLE CHEST 1 VIEW COMPARISON:  02/03/2018 FINDINGS: Endotracheal tube terminates 5.5 cm above the carina. Right jugular catheter terminates over the mid SVC. Left jugular catheter terminates over the upper SVC. The cardiac silhouette remains enlarged with similar appearance of pulmonary vascular congestion. Right greater than left lower lung hazy opacities are also similar to the prior study and likely represent small pleural effusions and atelectasis. No pneumothorax is identified. IMPRESSION: Unchanged pulmonary vascular congestion, pleural effusions, and bibasilar atelectasis. Electronically Signed   By: Logan Bores M.D.   On: 02/05/2018 11:30     ASSESSMENT AND PLAN:   57 year old male with past medical history of ischemic cardiomyopathy ejection fraction of 20 to 43%, chronic systolic CHF, Obstructive sleep apnea, hypertension, diabetes who presented to the hospital as he was found unresponsive and noted to be in pulseless cardiac arrest.  1.  Pulseless cardiac arrest-as a result of worsening respiratory failure and flash pulmonary edema. - Patient was resuscitated in the ER for about 17 minutes prior to regaining spontaneous circulation. - No further arrhythmias but prognosis is quite poor  2.   Respiratory  failure-patient was intubated after his cardiac arrest.  Not showing any signs of meaningful neurological recovery.  -Patient has increasing secretions and thought to have pneumonia suspected aspiration.  Status post bronchoscopy on 02/03/18  but as per intensivist not much secretions were removed.  FiO2 down to 28%. -Continue IV meropenem, further pulmonary toileting as per intensivist. - plan for Trach next week.   3.  Acute kidney injury with oliguria-secondary to underlying cardiac arrest and cardiorenal hemodynamics. -Patient's urine output remains fair with 760cc in the pat 24 hrs.  Patient still requiring intermittent HD. -As per nephrology plan for PermCath placement next week as patient is not dialysis dependent.   4.  Altered mental status/encephalopathy-suspected to be secondary to underlying anoxic brain injury. - Neurology was consulted, they have explained to the patient's wife about patient's poor prognosis and unlikely that he would recover from this.   -As per neurology patient does not meet fulfill criteria for brain death.  Family and wife are hopeful.  5.  Sepsis/septic shock- thought to have possible aspiration pneumonia.   Continue empiric Meropenem. - pt. Off vasopressors now.  Cultures remain (-).     6.  History of ischemic cardiomyopathy with ejection fraction of 20 to 25%- patient does not appear to be volume overloaded presently.   7.  Leukocytosis-secondary to suspected sepsis.  Follow with IV antibiotic therapy and it's improving.  Patient has a very poor prognosis given his multiple comorbidities and now with multiorgan failure and likely anoxic brain injury.  Family and wife want to cont. Aggressive care.     All the records are reviewed and case discussed with Care Management/Social Worker. Management plans discussed with the patient, family and they are in agreement.  CODE STATUS: Full code  DVT Prophylaxis: Hep SQ  TOTAL TIME TAKING CARE OF  THIS PATIENT: 30 minutes.   POSSIBLE D/C IN unclear, DEPENDING ON CLINICAL CONDITION and course   Henreitta Leber M.D on 02/05/2018 at 2:12 PM  Between 7am to 6pm - Pager - [phone]  After 6pm go to [email or web address] - password [email or web address] [name]  Office  [phone]  CC: Primary care physician; Marguerita Merles, MD

## 2018-02-05 NOTE — Progress Notes (Signed)
[facility]  ROUNDING NOTE   Subjective:  Critical illness persist. Patient was net -292 cc yesterday. Has significant edema.  Tracheostomy placement being considered for Tuesday. Urine output yesterday was 760 cc.  Objective:  Vital signs in last 24 hours:  Temp:  [97.5 F (36.4 C)-99.1 F (37.3 C)] 98.1 F (36.7 C) (08/04 0800) Pulse Rate:  [82-99] 82 (08/04 0800) Resp:  [17-27] 24 (08/04 0800) BP: (116-168)/(66-139) 125/69 (08/04 0800) SpO2:  [92 %-100 %] 98 % (08/04 0800) FiO2 (%):  [28 %] 28 % (08/04 0800) Weight:  [117 kg (257 lb 15 oz)] 117 kg (257 lb 15 oz) (08/04 0800)  Weight change:  Filed Weights   03/01/2018 1530 02/03/18 0500 02/05/18 0800  Weight: 118.7 kg (261 lb 11 oz) 117.9 kg (259 lb 14.8 oz) 117 kg (257 lb 15 oz)    Intake/Output: I/O last 3 completed shifts: In: 2260 [I.V.:1360; NG/GT:500; IV Piggyback:400] Out: 2540 [Urine:1040; Other:1500]   Intake/Output this shift:  No intake/output data recorded.  Physical Exam: General: Critically ill appearing  Head: Normocephalic, atraumatic. Moist oral mucosal membranes  Eyes: Anicteric  Neck: Supple, trachea midline  Lungs:  Clear to auscultation, vent assisted  Heart: S1S2 no rubs  Abdomen:  Soft, nontender, bowel sounds present  Extremities: 2+ peripheral edema.  Neurologic: Comatose, tremors in b/l LE's  Skin: No lesions  Access: Right femoral dialysis catheter    Basic Metabolic Panel: Recent Labs  Lab 02/01/18 1218 02/01/18 1645 02/01/18 2021 03/01/2018 0012 02/28/2018 0432 02/21/2018 1234 02/03/18 0509 02/04/18 1633  NA 136 137  --  136 137  --  140  --   K 4.5 4.8  --  5.0 4.9  --  4.5  --   CL 102 104  --  103 104  --  105  --   CO2 25 25  --  27 26  --  29  --   GLUCOSE 119* 115*  --  140* 105*  --  93  --   BUN 41* 39*  --  39* 39*  --  51*  --   CREATININE 2.63* 2.64*  --  2.39* 2.56*  --  3.43*  --   CALCIUM 8.2* 8.3*  --  8.2* 8.4*  --  8.0*  --   MG 2.2 2.3 2.3  2.3 2.2  --   --   --   PHOS 5.1* 4.7*  --  4.4 4.1 5.4*  --  6.8*    Liver Function Tests: Recent Labs  Lab 02/01/18 0812 02/01/18 1218 02/01/18 1645 02/06/2018 0012 02/12/2018 0432  ALBUMIN 1.8* 2.0* 2.1* 2.0* 2.1*   No results for input(s): LIPASE, AMYLASE in the last 168 hours. No results for input(s): AMMONIA in the last 168 hours.  CBC: Recent Labs  Lab 02/01/18 0446 02/22/2018 0432  WBC 23.8* 19.4*  NEUTROABS 19.7* 14.9*  HGB 8.9* 8.9*  HCT 26.3* 26.4*  MCV 91.6 91.9  PLT 100* 111*    Cardiac Enzymes: Recent Labs  Lab 01/30/18 0006  CKTOTAL 385    BNP: Invalid input(s): POCBNP  CBG: Recent Labs  Lab 02/04/18 1210 02/04/18 1552 02/04/18 1930 02/04/18 2345 02/05/18 0412  GLUCAP 87 91 112* 153* 114*    Microbiology: Results for orders placed or performed during the hospital encounter of 01/28/2018  MRSA PCR Screening     Status: None   Collection Time: 01/28/18  5:00 AM  Result Value Ref Range Status   MRSA by PCR NEGATIVE  NEGATIVE Final    Comment:        The GeneXpert MRSA Assay (FDA approved for NASAL specimens only), is one component of a comprehensive MRSA colonization surveillance program. It is not intended to diagnose MRSA infection nor to guide or monitor treatment for MRSA infections. Performed at [facility], [address] [place], [place] [address]   Urine Culture     Status: None   Collection Time: 01/28/18  6:16 AM  Result Value Ref Range Status   Specimen Description   Final    URINE, RANDOM Performed at [facility], [address] [place], [place] [address]    Special Requests   Final    NONE Performed at [facility], [address], [place], [place] [address]    Culture   Final    NO GROWTH Performed at [facility], [place] [address] [place], [place] [address]    Report Status 01/29/2018 FINAL  Final  Culture, blood (Routine X 2) w Reflex to ID Panel     Status: None    Collection Time: 01/28/18  6:45 AM  Result Value Ref Range Status   Specimen Description BLOOD LAC  Final   Special Requests   Final    BOTTLES DRAWN AEROBIC AND ANAEROBIC Blood Culture adequate volume   Culture   Final    NO GROWTH 5 DAYS Performed at [facility], [address] [place], [place] [address]    Report Status 02/14/2018 FINAL  Final  Culture, blood (Routine X 2) w Reflex to ID Panel     Status: None   Collection Time: 01/28/18  6:56 AM  Result Value Ref Range Status   Specimen Description BLOOD RAC  Final   Special Requests   Final    BOTTLES DRAWN AEROBIC AND ANAEROBIC Blood Culture results may not be optimal due to an inadequate volume of blood received in culture bottles   Culture   Final    NO GROWTH 5 DAYS Performed at [facility], [address], [place], [place] [address]    Report Status 02/23/2018 FINAL  Final  Culture, respiratory (non-expectorated)     Status: None   Collection Time: 01/28/18 11:24 AM  Result Value Ref Range Status   Specimen Description   Final    TRACHEAL ASPIRATE Performed at [facility], [address] [place], [place] [address]    Special Requests   Final    NONE Performed at [facility], [address] [place], [place] [address]    Gram Stain   Final    RARE WBC PRESENT,BOTH PMN AND MONONUCLEAR RARE GRAM POSITIVE COCCI IN PAIRS    Culture   Final    RARE Consistent with normal respiratory flora. Performed at [facility], [place] [address], [place], [place] [address]    Report Status 01/30/2018 FINAL  Final  Culture, respiratory (non-expectorated)     Status: None (Preliminary result)   Collection Time: 02/03/18  7:53 AM  Result Value Ref Range Status   Specimen Description   Final    TRACHEAL ASPIRATE Performed at [facility], [address] [place], [place] [address]    Special Requests   Final    NONE Performed at [facility], [place], [place] [address]    Gram Stain   Final    ABUNDANT WBC PRESENT, PREDOMINANTLY PMN FEW GRAM NEGATIVE RODS    Culture  Final    CULTURE REINCUBATED FOR BETTER GROWTH Performed at [facility], [place] [address], [place], [place] [address]    Report Status PENDING  Incomplete    Coagulation Studies: No results for input(s): LABPROT, INR in the last 72 hours.  Urinalysis: No results for input(s): COLORURINE, LABSPEC, PHURINE, GLUCOSEU, HGBUR, BILIRUBINUR, KETONESUR, PROTEINUR, UROBILINOGEN, NITRITE, LEUKOCYTESUR in the last 72 hours.  Invalid input(s): APPERANCEUR    Imaging: Ct Abdomen Wo Contrast  Result Date: 02/03/2018 CLINICAL DATA:  Evaluate anatomy prior to potential percutaneous gastrostomy tube placement. EXAM: CT ABDOMEN WITHOUT CONTRAST TECHNIQUE: Multidetector CT imaging of the abdomen was performed following the standard protocol without IV contrast. COMPARISON:  Abdominal radiographs-01/28/2018; chest radiograph-earlier same day FINDINGS: Lower chest: Limited visualization of the lower thorax demonstrates small/trace bilateral pleural effusions, right greater than left. Consolidative opacities are seen within the bilateral lung bases with associated air bronchograms, right greater than left. Cardiomegaly. Coronary artery calcifications. Diffuse decreased attenuation intra cardiac blood pool suggestive of anemia. Tip of dialysis catheter terminates within the distal SVC. Hepatobiliary: Hepatomegaly. There is mild nodularity of the hepatic contour. Layering debris is seen within otherwise normal-appearing gallbladder, similar to the 02/2009 examination and likely indicative of biliary sludge. No ascites. Pancreas: Normal noncontrast appearance of the pancreas Spleen: Interval development of a dystrophic calcification within a ill-defined hypoattenuating splenic lesion which appears otherwise unchanged compared to the 2010 examination and favored to represent a minimally  complex splenic cyst. Adrenals/Urinary Tract: Minimal amount of bilateral perinephric stranding. No urinary obstruction. No renal stones. Normal noncontrast appearance the bilateral adrenal glands. Stomach/Bowel: The anterior wall the stomach is well apposed against the ventral wall of the abdomen without interposed liver or transverse colon. No hiatal hernia. No evidence of enteric obstruction. No pneumoperitoneum, pneumatosis or portal venous gas. Vascular/Lymphatic: Calcified atherosclerotic plaque within normal caliber abdominal aorta. No bulky retroperitoneal or mesenteric lymphadenopathy on this noncontrast examination. Other: Diffuse body wall anasarca. Scattered foci of subcutaneous emphysema within the abdominal pannus likely at the location of subcutaneous medication administration Musculoskeletal: No acute or aggressive osseous abnormalities. IMPRESSION: 1. Gastric anatomy amenable to potential percutaneous gastrostomy tube placement as indicated. 2. Trace/small bilateral effusions with bibasilar consolidative opacities with associated air bronchograms, right greater than left, worrisome for multifocal infection/aspiration. 3. Suspected cholelithiasis. 4.  Aortic Atherosclerosis (ICD10-I70.0). Electronically Signed   By: Sandi Mariscal M.D.   On: 02/03/2018 14:09   Ir Gastrostomy Tube Mod Sed  Result Date: 02/03/2018 INDICATION: Dysphagia secondary to cardiac arrest and anoxic brain injury. Please perform percutaneous gastrostomy tube placement for enteric nutrition supplementation purposes. EXAM: PULL TROUGH GASTROSTOMY TUBE PLACEMENT COMPARISON:  Noncontrast abdominal CT - earlier same day MEDICATIONS: The patient is currently admitted to hospital receiving intravenous antibiotics; Antibiotics were administered within 1 hour of the procedure. Glucagon 1 mg IV CONTRAST:  20 mL of Isovue 300 administered into the gastric lumen. ANESTHESIA/SEDATION: None - patient is on continuous propofol drip FLUOROSCOPY  TIME:  1 minutes 12 seconds (40 mGy) COMPLICATIONS: None immediate. PROCEDURE: Informed written consent was obtained from the patient's family following explanation of the procedure, risks, benefits and alternatives. A time out was performed prior to the initiation of the procedure. Ultrasound scanning was performed to demarcate the edge of the left lobe of the liver. Maximal barrier sterile technique utilized including caps, mask, sterile gowns, sterile gloves, large sterile drape, hand hygiene and Betadine prep. The left upper quadrant was sterilely prepped and draped. An oral gastric catheter was inserted  into the stomach under fluoroscopy. The existing nasogastric feeding tube was removed. The left costal margin and air opacified transverse colon were identified and avoided. Air was injected into the stomach for insufflation and visualization under fluoroscopy. Under sterile conditions a 17 gauge trocar needle was utilized to access the stomach percutaneously beneath the left subcostal margin after the overlying soft tissues were anesthetized with 1% Lidocaine with epinephrine. Needle position was confirmed within the stomach with aspiration of air and injection of small amount of contrast. A single T tack was deployed for gastropexy. Over an Amplatz guide wire, a 9-French sheath was inserted into the stomach. A snare device was utilized to capture the oral gastric catheter. The snare device was pulled retrograde from the stomach up the esophagus and out the oropharynx. The 20-French pull-through gastrostomy was connected to the snare device and pulled antegrade through the oropharynx down the esophagus into the stomach and then through the percutaneous tract external to the patient. The gastrostomy was assembled externally. Contrast injection confirms position in the stomach. Several spot radiographic images were obtained in various obliquities for documentation. The patient tolerated procedure well without  immediate post procedural complication. FINDINGS: After successful fluoroscopic guided placement, the gastrostomy tube is appropriately positioned with internal disc against the ventral aspect of the gastric lumen. IMPRESSION: Successful fluoroscopic insertion of a 20-French pull-through gastrostomy tube. The gastrostomy may be used immediately for medication administration and in 24 hrs for the initiation of feeds. Electronically Signed   By: Sandi Mariscal M.D.   On: 02/03/2018 14:24   Dg Chest Port 1 View  Result Date: 02/03/2018 CLINICAL DATA:  Ventilation. EXAM: PORTABLE CHEST 1 VIEW COMPARISON:  Radiograph of February 01, 2018. FINDINGS: Endotracheal tube is in good position. Right internal jugular catheter is unchanged. Stable cardiomegaly with central pulmonary vascular congestion is noted. Mild bibasilar edema or atelectasis is noted with small right pleural effusion. No pneumothorax is noted. Bony thorax is unremarkable. IMPRESSION: Stable cardiomegaly with central pulmonary vascular congestion. Endotracheal tube in good position. Mild bibasilar subsegmental atelectasis or edema is noted with small right pleural effusion. Electronically Signed   By: Marijo Conception, M.D.   On: 02/03/2018 10:19     Medications:   . sodium chloride Stopped (02/27/2018 1838)  . sodium chloride 10 mL/hr at 02/04/18 1800  . famotidine (PEPCID) IV Stopped (02/04/18 2253)  . meropenem (MERREM) IV Stopped (02/04/18 2253)  . propofol (DIPRIVAN) infusion 60 mcg/kg/min (02/05/18 0625)   . chlorhexidine gluconate (MEDLINE KIT)  15 mL Mouth Rinse BID  . feeding supplement (PRO-STAT SUGAR FREE 64)  30 mL Per Tube BID  . feeding supplement (VITAL HIGH PROTEIN)  1,000 mL Per Tube Q24H  . heparin injection (subcutaneous)  5,000 Units Subcutaneous Q8H  . insulin aspart  0-20 Units Subcutaneous Q4H  . insulin glargine  5 Units Subcutaneous QHS  . ipratropium-albuterol  3 mL Nebulization Q6H  . mouth rinse  15 mL Mouth Rinse 10  times per day  . metoprolol tartrate  5 mg Intravenous Q6H  . sodium chloride flush  10-40 mL Intracatheter Q12H   sodium chloride, acetaminophen **OR** acetaminophen, fentaNYL, heparin, hydrALAZINE, sodium chloride flush  Assessment/ Plan:  57 y.o. male  with a PMHx of diabetes mellitus type 2, hypertension, obstructive sleep apnea, who was admitted to [facility] on 01/03/2018 for evaluation of cardiac arrest that occurred at home.   1.  Acute renal failure, oliguric. 2.  Metabolic acidosis improved with dialysis.  3.  Acute  respiratory failure, on the ventilator.  4.  Cardiac arrest, completed hypothermia protocol. 5.  Anemia unspecified.  Plan: Patient completed hemodialysis yesterday.  No urgent indication for dialysis today.  We may need to consider daily dialysis early next week.  He will likely need a PermCath at some point as well.  Patient scheduled for tracheostomy placement on Tuesday.  He remains on the ventilator at this time and weaning will likely be difficult given his comatose state.  Continue supportive care otherwise.    LOS: 8 Kashaun Bebo 8/4/20199:42 AM

## 2018-02-05 NOTE — Progress Notes (Signed)
Nutrition Follow-up  DOCUMENTATION CODES:   Obesity unspecified  INTERVENTION:  Continue Vital High Protein at 40 mL/hr (960 mL goal daily volume) + Pro-Stat 30 mL BID via G-tube. Provides 1160 kcal, 114 grams of protein, 806 mL H2O daily. With current propofol rate provides 2290 kcal daily.  Provide liquid MVI daily per tube as goal TF regimen does not meet 100% RDIs for vitamins/minerals.  Also provide B-complex with C QHS per tube.  Continue minimum free water flush of 20-30 mL Q4hrs per tube to maintain tube patency. Patient remains severely edematous.  NUTRITION DIAGNOSIS:   Inadequate oral intake related to inability to eat as evidenced by NPO status.  Ongoing - addressing with TF regimen.  GOAL:   Provide needs based on ASPEN/SCCM guidelines  Met with TF regimen.  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   -Neurological exam consistent with diffuse anoxic brain injury, but does not fulfill criteria for brain death. -Patient was on CRRT from 7/30 to 8/1. Received iHD on 8/1 with UF 1509 mL and on 8/2 with UF 1500 mL. -ENT was consulted for tracheostomy tube placement as family wishes to proceed with full scope care.  -Tracheostomy tube placement was attempted on 8/1 but patient desaturated so it has been delayed  until lung function improves. Tentative plan for 8/8. -GI attempted to place NGT/OGT on 8/1 but failed. Recommended IR attempt G-tube placement. -Patient s/p G-tube placement by IR on 8/2. -Patient s/p bronchoscopy on 8/2. -Per Nephrology note patient will likely need a PermCath at some point.  Patient remains intubated and sedated. On PRVC mode with FiO2 28%. On a much higher rate of propofol now than on previous assessment. Per RN having higher propofol requirements related to myoclonus, tachycardia, and agitation. Abdomen feels slightly distended today, but may be related to severe generalized edema. Had large BM today.  Access: 20 Fr. G-tube with C-clamp placed on 8/2 by IR; external bumper at approximately 4 cm [place]  MAP: 80-90 mmHg  TF: pt tolerating Vital High Protein at 40 mL/hr + Pro-Stat 30 mL BID  Patient is currently intubated on ventilator support Ve: 13.7 L/min Temp (24hrs), Avg:98.2 F (36.8 C), Min:97.5 F (36.4 C), Max:99.1 F (37.3 C)  Propofol: 42.8 ml/hr (1130 kcal daily)  Medications reviewed and include: Novolog 0-20 units Q4hrs (required 4 units past 24 hrs), Lantus 5 units QHS, metoprolol, famotidine, meropenem, propofol gtt.  Labs reviewed: CBG 87-153 past 24 hrs. Triglycerides 360 on 8/2 (trending up). Phosphorus 6.8 on 8/3.  I/O: 760 mL UOP yesterday (0.3 mL/kg/hr)  Weight trend: 117 kg on 8/4; +6.4 kg from admission and +13.1 kg from wt of 103.9 kg on 7/22 which is likely closer to dry weight  Discussed with RN.  Diet Order:   Diet Order           Diet NPO time specified  Diet effective now          EDUCATION NEEDS:   No education needs have been identified at this time  Skin:  Skin Assessment: Reviewed RN Assessment(ecchymosis to abdomen; weeping to bilateral upper and lower extremities)  Last BM:  02/05/2018 - large type 5  Height:   Ht Readings from Last 1 Encounters:  01/29/18 6' 1"  (1.854 m)    Weight:   Wt Readings from Last 1  Encounters:  02/05/18 257 lb 15 oz (117 kg)    Ideal Body Weight:  83.6 kg  BMI:  Body mass index is 34.03 kg/m.  Estimated Nutritional Needs:   Kcal:  2286 (PSU 2003b w/ MSJ 1921, Ve 13.7, Tmax 37.3)  Protein:  110-130 grams (1.3-1.6 grams/kg IBW)  Fluid:  2 L/day (25 mL/kg IBW)  Willey Blade, MS, RD, LDN Office: [phone] Pager: [phone] After Hours/Weekend Pager: [phone]

## 2018-02-06 ENCOUNTER — Ambulatory Visit: Payer: BLUE CROSS/BLUE SHIELD | Admitting: Family

## 2018-02-06 DIAGNOSIS — G931 Anoxic brain damage, not elsewhere classified: Secondary | ICD-10-CM

## 2018-02-06 LAB — RENAL FUNCTION PANEL
Albumin: 1.7 g/dL — ABNORMAL LOW (ref 3.5–5.0)
Anion gap: 13 (ref 5–15)
BUN: 84 mg/dL — ABNORMAL HIGH (ref 6–20)
CO2: 27 mmol/L (ref 22–32)
Calcium: 7.7 mg/dL — ABNORMAL LOW (ref 8.9–10.3)
Chloride: 98 mmol/L (ref 98–111)
Creatinine, Ser: 4.76 mg/dL — ABNORMAL HIGH (ref 0.61–1.24)
GFR calc Af Amer: 14 mL/min — ABNORMAL LOW
GFR calc non Af Amer: 12 mL/min — ABNORMAL LOW
Glucose, Bld: 113 mg/dL — ABNORMAL HIGH (ref 70–99)
Phosphorus: 7.8 mg/dL — ABNORMAL HIGH (ref 2.5–4.6)
Potassium: 4.4 mmol/L (ref 3.5–5.1)
Sodium: 138 mmol/L (ref 135–145)

## 2018-02-06 LAB — CBC
HEMATOCRIT: 23.6 % — AB (ref 40.0–52.0)
HEMOGLOBIN: 8.1 g/dL — AB (ref 13.0–18.0)
MCH: 31.6 pg (ref 26.0–34.0)
MCHC: 34.2 g/dL (ref 32.0–36.0)
MCV: 92.5 fL (ref 80.0–100.0)
Platelets: 203 10*3/uL (ref 150–440)
RBC: 2.55 MIL/uL — ABNORMAL LOW (ref 4.40–5.90)
RDW: 13.5 % (ref 11.5–14.5)
WBC: 23.2 10*3/uL — ABNORMAL HIGH (ref 3.8–10.6)

## 2018-02-06 LAB — GLUCOSE, CAPILLARY
Glucose-Capillary: 109 mg/dL — ABNORMAL HIGH (ref 70–99)
Glucose-Capillary: 110 mg/dL — ABNORMAL HIGH (ref 70–99)
Glucose-Capillary: 118 mg/dL — ABNORMAL HIGH (ref 70–99)
Glucose-Capillary: 119 mg/dL — ABNORMAL HIGH (ref 70–99)
Glucose-Capillary: 122 mg/dL — ABNORMAL HIGH (ref 70–99)
Glucose-Capillary: 123 mg/dL — ABNORMAL HIGH (ref 70–99)
Glucose-Capillary: 127 mg/dL — ABNORMAL HIGH (ref 70–99)

## 2018-02-06 LAB — PHOSPHORUS: Phosphorus: 7.8 mg/dL — ABNORMAL HIGH (ref 2.5–4.6)

## 2018-02-06 LAB — MAGNESIUM: Magnesium: 2.5 mg/dL — ABNORMAL HIGH (ref 1.7–2.4)

## 2018-02-06 MED ORDER — FAMOTIDINE 20 MG PO TABS
20.0000 mg | ORAL_TABLET | Freq: Every day | ORAL | Status: DC
  Administered 2018-02-06 – 2018-02-08 (×3): 20 mg
  Filled 2018-02-06 (×3): qty 1

## 2018-02-06 MED ORDER — LEVETIRACETAM IN NACL 500 MG/100ML IV SOLN
500.0000 mg | Freq: Two times a day (BID) | INTRAVENOUS | Status: DC
  Filled 2018-02-06 (×2): qty 100

## 2018-02-06 MED ORDER — SODIUM CHLORIDE 0.9 % IV SOLN
500.0000 mg | Freq: Two times a day (BID) | INTRAVENOUS | Status: DC
  Administered 2018-02-07 (×2): 500 mg via INTRAVENOUS
  Filled 2018-02-06 (×3): qty 5

## 2018-02-06 MED ORDER — FUROSEMIDE 10 MG/ML IJ SOLN
80.0000 mg | Freq: Once | INTRAMUSCULAR | Status: AC
  Administered 2018-02-06: 80 mg via INTRAVENOUS
  Filled 2018-02-06: qty 8

## 2018-02-06 NOTE — Care Management (Signed)
LTAC screen sent to [facility] and [employer]. Patient's insurance requires trach prior to LTAC.

## 2018-02-06 NOTE — Progress Notes (Signed)
eLink Physician-Brief Progress Note Patient Name: [name] DOB: July 09, 1960 MRN: [id number]   Date of Service  02/06/2018  HPI/Events of Note  Nurse mentioned patient having increased myoclonus. Likely due to anoxic injury.   eICU Interventions  Trial of keppra to see if it helps. He is on propofol. May need EEG in am.     Intervention Category Intermediate Interventions: Other:  [name] 02/06/2018, 11:42 PM

## 2018-02-06 NOTE — Progress Notes (Signed)
Sedation titrated down during shift and myoclonic jerk to left foot increased along with jerking movement to head.  Wife spoke with Dr. Mortimer Fries this morning and discussed plan of care and patient's status.

## 2018-02-06 NOTE — Progress Notes (Signed)
[facility]  ROUNDING NOTE   Subjective:   Meropenem  UOP 577  No family at bedside.   Objective:  Vital signs in last 24 hours:  Temp:  [97.7 F (36.5 C)-99 F (37.2 C)] 98.4 F (36.9 C) (08/05 0800) Pulse Rate:  [77-89] 89 (08/05 0800) Resp:  [21-28] 25 (08/05 0800) BP: (109-136)/(60-76) 136/75 (08/05 0800) SpO2:  [96 %-100 %] 96 % (08/05 0837) FiO2 (%):  [28 %] 28 % (08/05 0837) Weight:  [117.2 kg (258 lb 6.1 oz)] 117.2 kg (258 lb 6.1 oz) (08/05 0500)  Weight change:  Filed Weights   02/03/18 0500 02/05/18 0800 02/06/18 0500  Weight: 117.9 kg (259 lb 14.8 oz) 117 kg (257 lb 15 oz) 117.2 kg (258 lb 6.1 oz)    Intake/Output: I/O last 3 completed shifts: In: 3765.9 [I.V.:1852.6; NG/GT:1440; IV Piggyback:473.3] Out: 2327 [Urine:827; Other:1500]   Intake/Output this shift:  Total I/O In: 99.9 [I.V.:59.9; NG/GT:40] Out: -   Physical Exam: General: Critically ill  Head: ETT  Eyes: Anicteric   Neck: RIJ LIJ lines  Lungs:  Bilateral crackles, PRVC FiO2 28%  Heart: Regular rate and rhythm  Abdomen:  Soft, nontender, +PEG  Extremities: ++ peripheral edema.  Neurologic: Intubated, on sedation  Skin: No lesions  Access: RIJ temp HD catheter 7/29 Tukov    Basic Metabolic Panel: Recent Labs  Lab 02/01/18 1645  02/14/2018 0012 03/04/2018 0432 02/13/2018 1234 02/03/18 0509 02/04/18 1633 02/05/18 1139 02/05/18 1643 02/06/18 0503  NA 137  --  136 137  --  140  --  138  --   --   K 4.8  --  5.0 4.9  --  4.5  --  4.2  --   --   CL 104  --  103 104  --  105  --  100  --   --   CO2 25  --  27 26  --  29  --  28  --   --   GLUCOSE 115*  --  140* 105*  --  93  --  173*  --   --   BUN 39*  --  39* 39*  --  51*  --  68*  --   --   CREATININE 2.64*  --  2.39* 2.56*  --  3.43*  --  3.99*  --   --   CALCIUM 8.3*  --  8.2* 8.4*  --  8.0*  --  7.6*  --   --   MG 2.3   < > 2.3 2.2  --   --   --  2.4 2.4 2.5*  PHOS 4.7*  --  4.4 4.1 5.4*  --  6.8* 6.4* 6.9* 7.8*    < > = values in this interval not displayed.    Liver Function Tests: Recent Labs  Lab 02/01/18 0812 02/01/18 1218 02/01/18 1645 02/24/2018 0012 02/26/2018 0432  ALBUMIN 1.8* 2.0* 2.1* 2.0* 2.1*   No results for input(s): LIPASE, AMYLASE in the last 168 hours. No results for input(s): AMMONIA in the last 168 hours.  CBC: Recent Labs  Lab 02/01/18 0446 02/27/2018 0432 02/05/18 1139  WBC 23.8* 19.4* 22.4*  NEUTROABS 19.7* 14.9* 18.3*  HGB 8.9* 8.9* 8.2*  HCT 26.3* 26.4* 24.5*  MCV 91.6 91.9 92.8  PLT 100* 111* 172    Cardiac Enzymes: No results for input(s): CKTOTAL, CKMB, CKMBINDEX, TROPONINI in the last 168 hours.  BNP: Invalid input(s): POCBNP  CBG: Recent Labs  Lab 02/05/18 1601 02/05/18 1952 02/05/18 2354 02/06/18 0420 02/06/18 0714  GLUCAP 108* 121* 136* 110* 109*    Microbiology: Results for orders placed or performed during the hospital encounter of 01/25/2018  MRSA PCR Screening     Status: None   Collection Time: 01/28/18  5:00 AM  Result Value Ref Range Status   MRSA by PCR NEGATIVE NEGATIVE Final    Comment:        The GeneXpert MRSA Assay (FDA approved for NASAL specimens only), is one component of a comprehensive MRSA colonization surveillance program. It is not intended to diagnose MRSA infection nor to guide or monitor treatment for MRSA infections. Performed at [facility], [address], [place], [place] [address]   Urine Culture     Status: None   Collection Time: 01/28/18  6:16 AM  Result Value Ref Range Status   Specimen Description   Final    URINE, RANDOM Performed at [facility], [address] [place], [place] [address]    Special Requests   Final    NONE Performed at [facility], [address] [place], [place] [address]    Culture   Final    NO GROWTH Performed at [facility], [place] [address], [place], [place] [address]    Report Status 01/29/2018 FINAL  Final  Culture, blood  (Routine X 2) w Reflex to ID Panel     Status: None   Collection Time: 01/28/18  6:45 AM  Result Value Ref Range Status   Specimen Description BLOOD LAC  Final   Special Requests   Final    BOTTLES DRAWN AEROBIC AND ANAEROBIC Blood Culture adequate volume   Culture   Final    NO GROWTH 5 DAYS Performed at [facility], [address] [place], [place] [address]    Report Status 02/18/2018 FINAL  Final  Culture, blood (Routine X 2) w Reflex to ID Panel     Status: None   Collection Time: 01/28/18  6:56 AM  Result Value Ref Range Status   Specimen Description BLOOD RAC  Final   Special Requests   Final    BOTTLES DRAWN AEROBIC AND ANAEROBIC Blood Culture results may not be optimal due to an inadequate volume of blood received in culture bottles   Culture   Final    NO GROWTH 5 DAYS Performed at [facility], [address], [place], [place] [address]    Report Status 03/04/2018 FINAL  Final  Culture, respiratory (non-expectorated)     Status: None   Collection Time: 01/28/18 11:24 AM  Result Value Ref Range Status   Specimen Description   Final    TRACHEAL ASPIRATE Performed at [facility], [address] [place], [place] [address]    Special Requests   Final    NONE Performed at [facility], [address] [place], [place] [address]    Gram Stain   Final    RARE WBC PRESENT,BOTH PMN AND MONONUCLEAR RARE GRAM POSITIVE COCCI IN PAIRS    Culture   Final    RARE Consistent with normal respiratory flora. Performed at [facility], [place] [address] [place], [place] [address]    Report Status 01/30/2018 FINAL  Final  Culture, respiratory (non-expectorated)     Status: None (Preliminary result)   Collection Time: 02/03/18  7:53 AM  Result Value Ref Range Status   Specimen Description   Final    TRACHEAL  ASPIRATE Performed at [facility], [address] [place], [place] [address]    Special Requests   Final     NONE Performed at [facility], [place], [place] [address]    Gram Stain   Final    ABUNDANT WBC PRESENT, PREDOMINANTLY PMN FEW GRAM NEGATIVE RODS    Culture   Final    FEW GRAM NEGATIVE RODS IDENTIFICATION AND SUSCEPTIBILITIES TO FOLLOW Performed at [facility], [place] [address], [place],  [address]    Report Status PENDING  Incomplete    Coagulation Studies: No results for input(s): LABPROT, INR in the last 72 hours.  Urinalysis: No results for input(s): COLORURINE, LABSPEC, PHURINE, GLUCOSEU, HGBUR, BILIRUBINUR, KETONESUR, PROTEINUR, UROBILINOGEN, NITRITE, LEUKOCYTESUR in the last 72 hours.  Invalid input(s): APPERANCEUR    Imaging: Dg Chest Port 1 View  Result Date: 02/05/2018 CLINICAL DATA:  Mechanically assisted ventilation. EXAM: PORTABLE CHEST 1 VIEW COMPARISON:  02/03/2018 FINDINGS: Endotracheal tube terminates 5.5 cm above the carina. Right jugular catheter terminates over the mid SVC. Left jugular catheter terminates over the upper SVC. The cardiac silhouette remains enlarged with similar appearance of pulmonary vascular congestion. Right greater than left lower lung hazy opacities are also similar to the prior study and likely represent small pleural effusions and atelectasis. No pneumothorax is identified. IMPRESSION: Unchanged pulmonary vascular congestion, pleural effusions, and bibasilar atelectasis. Electronically Signed   By: Logan Bores M.D.   On: 02/05/2018 11:30     Medications:   . sodium chloride Stopped (02/13/2018 1838)  . sodium chloride 10 mL/hr at 02/05/18 1800  . famotidine (PEPCID) IV Stopped (02/05/18 2214)  . meropenem (MERREM) IV    . propofol (DIPRIVAN) infusion 70 mcg/kg/min (02/06/18 0729)   . B-complex with vitamin C  1 tablet Per Tube QHS  . chlorhexidine gluconate (MEDLINE KIT)  15 mL Mouth Rinse BID  . feeding supplement (PRO-STAT SUGAR FREE 64)  30 mL Per Tube BID  . feeding supplement (VITAL HIGH  PROTEIN)  1,000 mL Per Tube Q24H  . heparin injection (subcutaneous)  5,000 Units Subcutaneous Q8H  . insulin aspart  0-20 Units Subcutaneous Q4H  . insulin glargine  5 Units Subcutaneous QHS  . ipratropium-albuterol  3 mL Nebulization Q6H  . mouth rinse  15 mL Mouth Rinse 10 times per day  . metoprolol tartrate  5 mg Intravenous Q6H  . multivitamin  15 mL Per Tube Daily  . sodium chloride flush  10-40 mL Intracatheter Q12H   sodium chloride, acetaminophen **OR** acetaminophen, fentaNYL, heparin, hydrALAZINE, sodium chloride flush  Assessment/ Plan:  [name] is a 57 y.o. black male with diabetes mellitus type 2, hypertension, obstructive sleep apnea, who was admitted to [facility] on7/26/2019for evaluation of cardiac arrest that occurred at home.   1. Acute renal failure, oliguric. Baseline creatinine 0.68 12/13/17 2. Metabolic acidosis   3. Acute respiratory failure requiring mechanical ventilation  4. Cardiac arrest, completed hypothermia protocol. 5. Anemia with renal failure. 6. Hyperphosphatemia  Plan: Patient completed hemodialysis on 8/3.  No indication for dialysis today.  Last hemodialysis treatment was 8/3.  Nonoliguric urine output.  If further dialysis treatment requiring, then will proceed with permcath placement.  - Labs today - Trial of furosemide 60m x 1 today.    LOS: 9 Aleia Larocca 8/5/20199:04 AM

## 2018-02-06 NOTE — Progress Notes (Signed)
Patient at 70 mcg of Propofol due to restlessness, eye opening and tremors. Had to be turned and repositioned several times to be changed of incontinent episodes. Foley patent and intact. Amber urine output. Did start on hemodialysis after CRRT

## 2018-02-06 NOTE — Progress Notes (Signed)
Pharmacy Antibiotic Note  [name] is a 57 y.o. male admitted on 01/03/2018 s/p cardiac arrest and underwent targeted temperature management. Patient requiring mechanical ventilation. Patient previously on CRRT.  Pharmacy has been consulted for meropenem dosing for sepsis. Patient with recent history requiring mechanical ventilation and discharged on 7/23 - patient received both vancomycin and cefepime during admission.   Plan: Continue meropenem 500mg  IV Q24hr.   Height: 6\' 1"  (185.4 cm) Weight: 258 lb 6.1 oz (117.2 kg) IBW/kg (Calculated) : 79.9  Temp (24hrs), Avg:98.4 F (36.9 C), Min:97.9 F (36.6 C), Max:99.1 F (37.3 C)  Recent Labs  Lab 02/01/18 0446  02/01/18 2021 02/22/2018 0012 02/23/2018 0432 02/03/18 0509 02/05/18 1139 02/06/18 0503 02/06/18 0929  WBC 23.8*  --   --   --  19.4*  --  22.4*  --  23.2*  CREATININE 2.87*   < >  --  2.39* 2.56* 3.43* 3.99* 4.76*  --   VANCORANDOM  --   --  16  --   --   --   --   --   --    < > = values in this interval not displayed.    Estimated Creatinine Clearance: 23 mL/min (A) (by C-G formula based on SCr of 4.76 mg/dL (H)).    Allergies  Allergen Reactions  . Enalapril Maleate Anaphylaxis  . Iodinated Diagnostic Agents Itching  . Isosorbide Mononitrate Er [Isosorbide Dinitrate] Other (See Comments)    "made me not be able to think or function"  . Canagliflozin Other (See Comments)    Antimicrobials this admission: Cefepime 7/19 >> 7/23, 7/27 >> 8/2 Vancomycin 7/19 >> 7/22, 7/27 >> 7/31 Meropenem 8/2 >>  Dose adjustments this admission: 7/29 vancomycin held due to vancomycin level of 32 7/29 CRRT initiated. Patients cefepime and vancomycin adjusted 8/4 Meropenem adjusted   Microbiology results: 7/27 BCx: no growth x 5 days  7/27 UCx: no growth  7/27 Sputum: normal flora   7/27 MRSA PCR: negative  8/2 Tracheal Aspirate: few gram negative rods  Thank you for allowing pharmacy to be a part of this patient's  care.  Karna Abed L 02/06/2018 10:31 PM

## 2018-02-06 NOTE — Progress Notes (Signed)
[name] at [facility] NAME: [name]    MR#:  [id number]  DATE OF BIRTH:  27-Jun-1961  SUBJECTIVE:   No other acute events overnight.  Remains on a low-dose propofol drip.  This post IR guided gastrostomy tube placement 3 days ago and now on tube feeds and tolerating it well.  Remains intubated  REVIEW OF SYSTEMS:    Review of Systems  Unable to perform ROS: Intubated    Nutrition: Tube feeds Tolerating Diet: Yes  DRUG ALLERGIES:   Allergies  Allergen Reactions  . Enalapril Maleate Anaphylaxis  . Iodinated Diagnostic Agents Itching  . Isosorbide Mononitrate Er [Isosorbide Dinitrate] Other (See Comments)    "made me not be able to think or function"  . Canagliflozin Other (See Comments)    VITALS:  Blood pressure (!) 145/79, pulse 90, temperature 98.4 F (36.9 C), resp. rate (!) 25, height 6\' 1"  (1.854 m), weight 117.2 kg (258 lb 6.1 oz), SpO2 94 %.  PHYSICAL EXAMINATION:   Physical Exam  GENERAL:  57 y.o.-year-old patient lying in bed intubated & Sedated.  EYES: PERRL but minimally responsive.  HEENT: Head atraumatic, normocephalic. ET and OG tubes in place.   NECK:  Supple, no jugular venous distention. No thyroid enlargement, no tenderness.  LUNGS: Normal breath sounds bilaterally, no wheezing, rales, rhonchi. No use of accessory muscles of respiration.  CARDIOVASCULAR: S1, S2 normal. No murmurs, rubs, or gallops.  ABDOMEN: Soft, nontender, nondistended. Bowel sounds present. No organomegaly or mass.  + G-tube in place.  EXTREMITIES: No cyanosis, clubbing or edema b/l.    NEUROLOGIC: Sedated & Intubated.  Having some myoclonic jerks occasionally, positive clonus at times. PSYCHIATRIC: Sedated & Intubated.   SKIN: No obvious rash, lesion, or ulcer.    LABORATORY PANEL:   CBC Recent Labs  Lab 02/06/18 0929  WBC 23.2*  HGB 8.1*  HCT 23.6*  PLT 203    ------------------------------------------------------------------------------------------------------------------  Chemistries  Recent Labs  Lab 02/06/18 0503  NA 138  K 4.4  CL 98  CO2 27  GLUCOSE 113*  BUN 84*  CREATININE 4.76*  CALCIUM 7.7*  MG 2.5*   ------------------------------------------------------------------------------------------------------------------  Cardiac Enzymes No results for input(s): TROPONINI in the last 168 hours. ------------------------------------------------------------------------------------------------------------------  RADIOLOGY:  Dg Chest Port 1 View  Result Date: 02/05/2018 CLINICAL DATA:  Mechanically assisted ventilation. EXAM: PORTABLE CHEST 1 VIEW COMPARISON:  02/03/2018 FINDINGS: Endotracheal tube terminates 5.5 cm above the carina. Right jugular catheter terminates over the mid SVC. Left jugular catheter terminates over the upper SVC. The cardiac silhouette remains enlarged with similar appearance of pulmonary vascular congestion. Right greater than left lower lung hazy opacities are also similar to the prior study and likely represent small pleural effusions and atelectasis. No pneumothorax is identified. IMPRESSION: Unchanged pulmonary vascular congestion, pleural effusions, and bibasilar atelectasis. Electronically Signed   By: Logan Bores M.D.   On: 02/05/2018 11:30     ASSESSMENT AND PLAN:   57 year old male with past medical history of ischemic cardiomyopathy ejection fraction of 20 to 74%, chronic systolic CHF, Obstructive sleep apnea, hypertension, diabetes who presented to the hospital as he was found unresponsive and noted to be in pulseless cardiac arrest.  1.  Altered mental status/encephalopathy-suspected to be secondary to underlying anoxic brain injury. - Neurology was consulted, they have explained to the patient's wife about patient's poor prognosis and unlikely that he would recover from this.   -As per neurology  patient does not meet fulfill  criteria for brain death.  Family and wife are hopeful.  2. Acute Respiratory failure-patient was intubated after his cardiac arrest.  Not showing any signs of meaningful neurological recovery.  -Patient has increasing secretions and thought to have pneumonia suspected aspiration.  Status post bronchoscopy on 02/03/18  but as per intensivist not much secretions were removed.   -Continue IV meropenem, further pulmonary toileting as per intensivist. - plan for Trach next week.   3.  Acute kidney injury with oliguria-secondary to underlying cardiac arrest and cardiorenal hemodynamics. -  Patient still requiring intermittent HD.  No plans for hemodialysis today.  Last hemodialysis on August 3 -As per nephrology plan for PermCath placement this week as patient is not dialysis dependent.   4.  Pulseless cardiac arrest-as a result of worsening respiratory failure and flash pulmonary edema. - Patient was resuscitated in the ER for about 17 minutes prior to regaining spontaneous circulation. - No further arrhythmias but prognosis is quite poor 5.  Sepsis/septic shock- thought to have possible aspiration pneumonia.   Continue empiric Meropenem. - pt. Off vasopressors now.  Cultures remain (-).     6.  History of ischemic cardiomyopathy with ejection fraction of 20 to 25%- patient does not appear to be volume overloaded presently.   7.  Leukocytosis-secondary to suspected sepsis.  Follow with IV antibiotic therapy and it's improving.  Patient has a very poor prognosis given his multiple comorbidities and now with multiorgan failure and likely anoxic brain injury.  Family and wife want to cont. Aggressive care.     All the records are reviewed and case discussed with Care Management/Social Worker. Management plans discussed with the patient, family and they are in agreement.  CODE STATUS: Full code  DVT Prophylaxis: Hep SQ  TOTAL TIME TAKING CARE OF THIS PATIENT: 28  minutes.   POSSIBLE D/C IN unclear, DEPENDING ON CLINICAL CONDITION and course   Nicholes Mango M.D on 02/06/2018 at 4:27 PM  Between 7am to 6pm - Pager - [phone]  After 6pm go to [email or web address] - password [email or web address] [facility]  Office  [phone]  CC: Primary care physician; Marguerita Merles, MD

## 2018-02-06 NOTE — Progress Notes (Signed)
Follow up - Critical Care Medicine Note  Patient Details:    PERRY BRUCATO is an 57 y.o. male.with a history of type 2 diabetes hypertension and sleep apnea who was brought to the ED by his spouse in cardiac and respiratory arrest.     HPI Remains intubated Sedated Acute encephalopathy from anoxic brain injury Severe resp failure Full vent support  Lines, Airways, Drains: Airway 7 mm (Active)  Secured at (cm) 24 cm 01/29/2018  8:04 AM  Measured From Lips 01/29/2018  8:04 AM  [place] 01/29/2018  8:04 AM  Secured By [employer] 01/29/2018  8:04 AM  Tube Holder Repositioned Yes 01/29/2018  3:38 AM  Cuff Pressure (cm H2O) 25 cm H2O 01/29/2018  8:04 AM  Site Condition Dry 01/29/2018  8:04 AM     CVC Triple Lumen 01/28/18 Left Internal jugular (Active)  Indication for Insertion or Continuance of Line Vasoactive infusions 01/29/2018  8:00 AM  Site Assessment Clean;Dry;Intact 01/29/2018  8:00 AM  Proximal Lumen Status Infusing;Flushed;Blood return noted 01/29/2018  8:00 AM  Medial Lumen Status Infusing;Flushed;Blood return noted 01/29/2018  8:00 AM  Distal Lumen Status Infusing;Flushed;Blood return noted 01/29/2018  8:00 AM  Dressing Type Transparent;Occlusive 01/29/2018  8:00 AM  Dressing Status Clean;Dry;Intact;Antimicrobial disc in place 01/29/2018  8:00 AM  Line Care Connections checked and tightened;Zeroed and calibrated;Leveled 01/29/2018  8:00 AM  Dressing Intervention New dressing 01/28/2018  4:00 AM  Dressing Change Due 02/04/18 01/29/2018  8:00 AM     Arterial Line 01/28/18 Right Radial (Active)  Site Assessment Clean;Dry;Intact 01/29/2018  8:00 AM  Line Status Pulsatile blood flow;Positional 01/29/2018  8:00 AM  Art Line Waveform Appropriate 01/29/2018  8:00 AM  Art Line Interventions Zeroed and calibrated 01/29/2018  8:00 AM  Color/Movement/Sensation Capillary refill less than 3 sec 01/29/2018  8:00 AM  Dressing Type Transparent;Occlusive 01/29/2018  8:00 AM   Dressing Status Clean;Intact;Dry;Antimicrobial disc in place 01/29/2018  8:00 AM  Interventions New dressing;Antimicrobial disc changed 01/28/2018  4:00 AM  Dressing Change Due 02/04/18 01/29/2018  8:00 AM     Urethral Catheter THAHN, ed tech Straight-tip;Temperature probe 16 Fr. (Active)  Indication for Insertion or Continuance of Catheter Unstable critical patients (first 24-48 hours) 01/29/2018  8:00 AM  Site Assessment Clean;Intact;Dry 01/29/2018  8:00 AM  Catheter Maintenance Bag below level of bladder;Catheter secured;Drainage bag/tubing not touching floor 01/29/2018  8:00 AM  Collection Container Standard drainage bag 01/29/2018  8:00 AM  Securement Method Other (Comment) 01/29/2018  8:00 AM  Output (mL) 0 mL 01/29/2018  8:00 AM    Anti-infectives:  Anti-infectives (From admission, onward)   Start     Dose/Rate Route Frequency Ordered Stop   02/06/18 1000  meropenem (MERREM) 500 mg in sodium chloride 0.9 % 100 mL IVPB     500 mg 200 mL/hr over 30 Minutes Intravenous Every 24 hours 02/05/18 1239     02/03/18 1200  meropenem (MERREM) 1 g in sodium chloride 0.9 % 100 mL IVPB  Status:  Discontinued     1 g 200 mL/hr over 30 Minutes Intravenous Every 12 hours 02/03/18 1033 02/05/18 1239   03/02/2018 1800  ceFEPIme (MAXIPIME) 2 g in sodium chloride 0.9 % 100 mL IVPB  Status:  Discontinued    Note to Pharmacy:  7 day stop date. Thank you   2 g 200 mL/hr over 30 Minutes Intravenous Every 12 hours 02/06/2018 0753 02/03/18 1031   01/30/18 2200  vancomycin (VANCOCIN) IVPB 1000 mg/200 mL premix  Status:  Discontinued     1,000 mg 200 mL/hr over 60 Minutes Intravenous Every 24 hours 01/30/18 2043 02/12/2018 0753   01/30/18 0500  vancomycin (VANCOCIN) 1,250 mg in sodium chloride 0.9 % 250 mL IVPB  Status:  Discontinued     1,250 mg 166.7 mL/hr over 90 Minutes Intravenous Every 18 hours 01/29/18 0956 01/30/18 0308   01/30/18 0307  vancomycin (VANCOCIN) 1,250 mg in sodium chloride 0.9 % 250 mL IVPB   Status:  Discontinued     1,250 mg 166.7 mL/hr over 90 Minutes Intravenous As needed 01/30/18 0308 02/01/18 1549   01/28/18 1400  vancomycin (VANCOCIN) 1,250 mg in sodium chloride 0.9 % 250 mL IVPB  Status:  Discontinued     1,250 mg 166.7 mL/hr over 90 Minutes Intravenous Every 18 hours 01/28/18 0544 01/29/18 0956   01/28/18 0600  ceFEPIme (MAXIPIME) 2 g in sodium chloride 0.9 % 100 mL IVPB  Status:  Discontinued     2 g 200 mL/hr over 30 Minutes Intravenous Every 12 hours 01/28/18 0544 02/05/2018 0753   01/28/18 0545  vancomycin (VANCOCIN) 1,250 mg in sodium chloride 0.9 % 250 mL IVPB     1,250 mg 166.7 mL/hr over 90 Minutes Intravenous  Once 01/28/18 0544 01/28/18 1020      Microbiology: Results for orders placed or performed during the hospital encounter of 01/06/2018  MRSA PCR Screening     Status: None   Collection Time: 01/28/18  5:00 AM  Result Value Ref Range Status   MRSA by PCR NEGATIVE NEGATIVE Final    Comment:        The GeneXpert MRSA Assay (FDA approved for NASAL specimens only), is one component of a comprehensive MRSA colonization surveillance program. It is not intended to diagnose MRSA infection nor to guide or monitor treatment for MRSA infections. Performed at [facility], [address], [place], [place] [address]   Urine Culture     Status: None   Collection Time: 01/28/18  6:16 AM  Result Value Ref Range Status   Specimen Description   Final    URINE, RANDOM Performed at [facility], [address] [place], [place] [address]    Special Requests   Final    NONE Performed at [facility], [address] [place], [place] [address]    Culture   Final    NO GROWTH Performed at [facility], [place] [address], [place], [place] [address]    Report Status 01/29/2018 FINAL  Final  Culture, blood (Routine X 2) w Reflex to ID Panel     Status: None   Collection Time: 01/28/18  6:45 AM  Result Value Ref Range Status    Specimen Description BLOOD LAC  Final   Special Requests   Final    BOTTLES DRAWN AEROBIC AND ANAEROBIC Blood Culture adequate volume   Culture   Final    NO GROWTH 5 DAYS Performed at [facility], [address] [place], [place] [address]    Report Status 03/03/2018 FINAL  Final  Culture, blood (Routine X 2) w Reflex to ID Panel     Status: None   Collection Time: 01/28/18  6:56 AM  Result Value Ref Range Status   Specimen Description BLOOD RAC  Final   Special Requests   Final    BOTTLES DRAWN AEROBIC AND ANAEROBIC Blood Culture results may not be optimal due to an inadequate volume of blood received in culture bottles  Culture   Final    NO GROWTH 5 DAYS Performed at [facility], [address] [place], [place] [address]    Report Status 02/11/2018 FINAL  Final  Culture, respiratory (non-expectorated)     Status: None   Collection Time: 01/28/18 11:24 AM  Result Value Ref Range Status   Specimen Description   Final    TRACHEAL ASPIRATE Performed at [facility], [address], [place], [place] [address]    Special Requests   Final    NONE Performed at [facility], [place], [place] [address]    Gram Stain   Final    RARE WBC PRESENT,BOTH PMN AND MONONUCLEAR RARE GRAM POSITIVE COCCI IN PAIRS    Culture   Final    RARE Consistent with normal respiratory flora. Performed at [facility], [place] [address], [place], [place] [address]    Report Status 01/30/2018 FINAL  Final  Culture, respiratory (non-expectorated)     Status: None (Preliminary result)   Collection Time: 02/03/18  7:53 AM  Result Value Ref Range Status   Specimen Description   Final    TRACHEAL ASPIRATE Performed at [facility], [address], [place], [place] [address]    Special Requests   Final    NONE Performed at [facility], [address] [place], [place] [address]    Gram Stain   Final    ABUNDANT WBC  PRESENT, PREDOMINANTLY PMN FEW GRAM NEGATIVE RODS    Culture   Final    FEW GRAM NEGATIVE RODS IDENTIFICATION AND SUSCEPTIBILITIES TO FOLLOW Performed at [facility], [place] [address], [place], [place] [address]    Report Status PENDING  Incomplete   Events:   Studies: Ct Abdomen Wo Contrast  Result Date: 02/03/2018 CLINICAL DATA:  Evaluate anatomy prior to potential percutaneous gastrostomy tube placement. EXAM: CT ABDOMEN WITHOUT CONTRAST TECHNIQUE: Multidetector CT imaging of the abdomen was performed following the standard protocol without IV contrast. COMPARISON:  Abdominal radiographs-01/28/2018; chest radiograph-earlier same day FINDINGS: Lower chest: Limited visualization of the lower thorax demonstrates small/trace bilateral pleural effusions, right greater than left. Consolidative opacities are seen within the bilateral lung bases with associated air bronchograms, right greater than left. Cardiomegaly. Coronary artery calcifications. Diffuse decreased attenuation intra cardiac blood pool suggestive of anemia. Tip of dialysis catheter terminates within the distal SVC. Hepatobiliary: Hepatomegaly. There is mild nodularity of the hepatic contour. Layering debris is seen within otherwise normal-appearing gallbladder, similar to the 02/2009 examination and likely indicative of biliary sludge. No ascites. Pancreas: Normal noncontrast appearance of the pancreas Spleen: Interval development of a dystrophic calcification within a ill-defined hypoattenuating splenic lesion which appears otherwise unchanged compared to the 2010 examination and favored to represent a minimally complex splenic cyst. Adrenals/Urinary Tract: Minimal amount of bilateral perinephric stranding. No urinary obstruction. No renal stones. Normal noncontrast appearance the bilateral adrenal glands. Stomach/Bowel: The anterior wall the stomach is well apposed against the ventral wall of the abdomen without interposed liver or  transverse colon. No hiatal hernia. No evidence of enteric obstruction. No pneumoperitoneum, pneumatosis or portal venous gas. Vascular/Lymphatic: Calcified atherosclerotic plaque within normal caliber abdominal aorta. No bulky retroperitoneal or mesenteric lymphadenopathy on this noncontrast examination. Other: Diffuse body wall anasarca. Scattered foci of subcutaneous emphysema within the abdominal pannus likely at the location of subcutaneous medication administration Musculoskeletal: No acute or aggressive osseous abnormalities. IMPRESSION: 1. Gastric anatomy amenable to potential percutaneous gastrostomy tube placement as  indicated. 2. Trace/small bilateral effusions with bibasilar consolidative opacities with associated air bronchograms, right greater than left, worrisome for multifocal infection/aspiration. 3. Suspected cholelithiasis. 4.  Aortic Atherosclerosis (ICD10-I70.0). Electronically Signed   By: Sandi Mariscal M.D.   On: 02/03/2018 14:09   Dg Abd 1 View  Result Date: 01/28/2018 CLINICAL DATA:  NG tube placement EXAM: ABDOMEN - 1 VIEW COMPARISON:  01/20/2018 FINDINGS: No enteric tube is demonstrated within the field of view. Mild gaseous distention of the stomach. IMPRESSION: No enteric tube visualized. Electronically Signed   By: Lucienne Capers M.D.   On: 01/28/2018 05:53   Dg Abd 1 View  Result Date: 01/20/2018 CLINICAL DATA:  Encounter for NG tube placement EXAM: ABDOMEN - 1 VIEW COMPARISON:  01/20/2018 FINDINGS: Malpositioned nasogastric tube, coiled backwards overlying the mid esophagus with tip excluded from view Endotracheal tube overlies the tracheal air column with tip superior to the carina. No pneumothorax. Pacer leads overlie the left ventricular apex. Pulmonary vasculature remains indistinct with cephalization of flow. Suspected trace right-sided pleural effusion. No definite left-sided pleural effusion, though the left costophrenic angle is excluded from view. Nonobstructive bowel  gas pattern. No pneumoperitoneum, pneumatosis or portal venous gas. IMPRESSION: 1. Malpositioned nasogastric tube.  Repositioning is advised. 2. Similar findings of pulmonary edema and trace right-sided pleural effusion. 3. Nonobstructive bowel gas pattern. Electronically Signed   By: Sandi Mariscal M.D.   On: 01/20/2018 14:03   Dg Abd 1 View  Result Date: 01/20/2018 CLINICAL DATA:  NG tube placement EXAM: ABDOMEN - 1 VIEW COMPARISON:  None. FINDINGS: There is no nasogastric tube identified in the lower chest or abdomen. There is mild gaseous distension of the colon and stomach. There is no evidence of pneumoperitoneum, portal venous gas or pneumatosis. There are no pathologic calcifications along the expected course of the ureters. The osseous structures are unremarkable. IMPRESSION: No nasogastric tube identified in the lower chest or abdomen. Electronically Signed   By: Kathreen Devoid   On: 01/20/2018 12:42   Dg Abd 1 View  Result Date: 01/20/2018 CLINICAL DATA:  Abdominal pain. EXAM: ABDOMEN - 1 VIEW COMPARISON:  None. FINDINGS: A right-sided pleural effusion is suspected layering posteriorly. Pacer paddles are noted on the chest. The bowel gas pattern is unremarkable. Air and stool scattered throughout the colon. A few scattered air-filled small bowel loops. No obvious free air. A right femoral line is noted. Bony structures are unremarkable. IMPRESSION: Suspect right pleural effusion. No plain film findings for an acute abdominal process. Electronically Signed   By: Marijo Sanes M.D.   On: 01/20/2018 11:22   Ct Head Wo Contrast  Result Date: 01/30/2018 CLINICAL DATA:  Anoxic brain injury EXAM: CT HEAD WITHOUT CONTRAST TECHNIQUE: Contiguous axial images were obtained from the base of the skull through the vertex without intravenous contrast. COMPARISON:  Head CT 01/28/2018 FINDINGS: Brain: There is diffuse blurring of the gray-white interface, slightly greater than on the prior examination. There is  crowding of the basal cisterns. No acute hemorrhage or other extra-axial collection. Vascular: No abnormal hyperdensity of the major intracranial arteries or dural venous sinuses. No intracranial atherosclerosis. Skull: The visualized skull base, calvarium and extracranial soft tissues are normal. Sinuses/Orbits: No fluid levels or advanced mucosal thickening of the visualized paranasal sinuses. No mastoid or middle ear effusion. The orbits are normal. IMPRESSION: Diffuse blurring of the gray-white interface, compatible with global edema, slightly progressed compared to 01/28/2018. Mild worsening of basal cisternal crowding. Electronically Signed   By: Lennette Bihari  [place] M.D.   On: 01/30/2018 13:17   Ct Head Wo Contrast  Result Date: 01/28/2018 CLINICAL DATA:  Cardiac arrest. History of hypertension and diabetes. EXAM: CT HEAD WITHOUT CONTRAST TECHNIQUE: Contiguous axial images were obtained from the base of the skull through the vertex without intravenous contrast. COMPARISON:  CT neck January 20, 2018. FINDINGS: BRAIN: No intraparenchymal hemorrhage, mass effect nor midline shift. Mild blurring of the gray-white matter differentiation. Patchy supratentorial white matter hypodensities. The ventricles and sulci are normal. No acute large vascular territory infarcts. No abnormal extra-axial fluid collections. Basal cisterns are patent. VASCULAR: Mild calcific atherosclerosis carotid bifurcations. SKULL/SOFT TISSUES: No skull fracture. No significant soft tissue swelling. ORBITS/SINUSES: The included ocular globes and orbital contents are normal.Mild paranasal sinus mucosal thickening without air-fluid levels. Included mastoid air cells are well aerated. OTHER: Life-support lines in place. IMPRESSION: 1. 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 2. Mild chronic small vessel ischemic changes. 3. Mild atherosclerosis. 4. Acute findings discussed with and reconfirmed by Dr.ALLISON WEBSTER on  01/28/2018 at 1:00 am. Electronically Signed   By: Elon Alas M.D.   On: 01/28/2018 01:04   Ct Soft Tissue Neck Wo Contrast  Result Date: 01/20/2018 CLINICAL DATA:  57 y/o  M; thyroid nodule. EXAM: CT NECK WITHOUT CONTRAST TECHNIQUE: Multidetector CT imaging of the neck was performed following the standard protocol without intravenous contrast. COMPARISON:  None. FINDINGS: Pharynx and larynx: Debris within the airways from intubation. Salivary glands: No inflammation, mass, or stone. Thyroid: 5.6 cm nodule within the left lobe of the thyroid. 18 mm nodule in the right lobe of thyroid. Lymph nodes: None enlarged or abnormal density. Vascular: Negative. Limited intracranial: Negative. Visualized orbits: Negative. Mastoids and visualized paranasal sinuses: Mild mucosal thickening of the left maxillary sinus. Normal aeration of the mastoid air cells. Skeleton: No acute or aggressive process. Dental disease with multiple periapical cysts and absent crowns. Mild cervical spondylosis greatest at the C5-6 level. Upper chest: Endotracheal tube tip 3.4 cm above the carina. Moderate bilateral pleural effusions and dependent atelectasis of the lungs. Other: None. IMPRESSION: 1. Thyroid nodules measuring up to 5.6 cm in the left lobe of thyroid. Mass effect from the large left lobe of thyroid nodule displaces the airway rightward. 2. Endotracheal tube tip 3.4 cm above the carina. 3. Moderate bilateral pleural effusion with dependent atelectasis of the lungs. Electronically Signed   By: Kristine Garbe M.D.   On: 01/20/2018 04:44   Ir Gastrostomy Tube Mod Sed  Result Date: 02/03/2018 INDICATION: Dysphagia secondary to cardiac arrest and anoxic brain injury. Please perform percutaneous gastrostomy tube placement for enteric nutrition supplementation purposes. EXAM: PULL TROUGH GASTROSTOMY TUBE PLACEMENT COMPARISON:  Noncontrast abdominal CT - earlier same day MEDICATIONS: The patient is currently admitted to  hospital receiving intravenous antibiotics; Antibiotics were administered within 1 hour of the procedure. Glucagon 1 mg IV CONTRAST:  20 mL of Isovue 300 administered into the gastric lumen. ANESTHESIA/SEDATION: None - patient is on continuous propofol drip FLUOROSCOPY TIME:  1 minutes 12 seconds (40 mGy) COMPLICATIONS: None immediate. PROCEDURE: Informed written consent was obtained from the patient's family following explanation of the procedure, risks, benefits and alternatives. A time out was performed prior to the initiation of the procedure. Ultrasound scanning was performed to demarcate the edge of the left lobe of the liver. Maximal barrier sterile technique utilized including caps, mask, sterile gowns, sterile gloves, large sterile drape, hand hygiene and Betadine prep. The left upper quadrant was sterilely prepped and draped. An  oral gastric catheter was inserted into the stomach under fluoroscopy. The existing nasogastric feeding tube was removed. The left costal margin and air opacified transverse colon were identified and avoided. Air was injected into the stomach for insufflation and visualization under fluoroscopy. Under sterile conditions a 17 gauge trocar needle was utilized to access the stomach percutaneously beneath the left subcostal margin after the overlying soft tissues were anesthetized with 1% Lidocaine with epinephrine. Needle position was confirmed within the stomach with aspiration of air and injection of small amount of contrast. A single T tack was deployed for gastropexy. Over an Amplatz guide wire, a 9-French sheath was inserted into the stomach. A snare device was utilized to capture the oral gastric catheter. The snare device was pulled retrograde from the stomach up the esophagus and out the oropharynx. The 20-French pull-through gastrostomy was connected to the snare device and pulled antegrade through the oropharynx down the esophagus into the stomach and then through the  percutaneous tract external to the patient. The gastrostomy was assembled externally. Contrast injection confirms position in the stomach. Several spot radiographic images were obtained in various obliquities for documentation. The patient tolerated procedure well without immediate post procedural complication. FINDINGS: After successful fluoroscopic guided placement, the gastrostomy tube is appropriately positioned with internal disc against the ventral aspect of the gastric lumen. IMPRESSION: Successful fluoroscopic insertion of a 20-French pull-through gastrostomy tube. The gastrostomy may be used immediately for medication administration and in 24 hrs for the initiation of feeds. Electronically Signed   By: Sandi Mariscal M.D.   On: 02/03/2018 14:24   Dg Chest Port 1 View  Result Date: 02/05/2018 CLINICAL DATA:  Mechanically assisted ventilation. EXAM: PORTABLE CHEST 1 VIEW COMPARISON:  02/03/2018 FINDINGS: Endotracheal tube terminates 5.5 cm above the carina. Right jugular catheter terminates over the mid SVC. Left jugular catheter terminates over the upper SVC. The cardiac silhouette remains enlarged with similar appearance of pulmonary vascular congestion. Right greater than left lower lung hazy opacities are also similar to the prior study and likely represent small pleural effusions and atelectasis. No pneumothorax is identified. IMPRESSION: Unchanged pulmonary vascular congestion, pleural effusions, and bibasilar atelectasis. Electronically Signed   By: Logan Bores M.D.   On: 02/05/2018 11:30   Dg Chest Port 1 View  Result Date: 02/03/2018 CLINICAL DATA:  Ventilation. EXAM: PORTABLE CHEST 1 VIEW COMPARISON:  Radiograph of February 01, 2018. FINDINGS: Endotracheal tube is in good position. Right internal jugular catheter is unchanged. Stable cardiomegaly with central pulmonary vascular congestion is noted. Mild bibasilar edema or atelectasis is noted with small right pleural effusion. No pneumothorax is  noted. Bony thorax is unremarkable. IMPRESSION: Stable cardiomegaly with central pulmonary vascular congestion. Endotracheal tube in good position. Mild bibasilar subsegmental atelectasis or edema is noted with small right pleural effusion. Electronically Signed   By: Marijo Conception, M.D.   On: 02/03/2018 10:19   Dg Chest Port 1 View  Result Date: 02/01/2018 CLINICAL DATA:  Cardiac and respiratory arrest.  Post intubation. EXAM: PORTABLE CHEST 1 VIEW COMPARISON:  01/31/2018 FINDINGS: Endotracheal tube 6.9 cm above the carina. Advancement with 3-4 cm may be considered. Central lines in stable position. Enlarged globular heart.  Mediastinal contours appear intact. Bilateral pleural effusions and interstitial pulmonary edema. More focal airspace consolidation in the right lung base cannot be excluded. Osseous structures are without acute abnormality. Soft tissues are grossly normal. IMPRESSION: Endotracheal tube 6.9 cm above the carina. Advancement with 3-4 cm may be considered. Enlarged globular heart.  This may be due to cardiomegaly or pericardial effusion. Interstitial pulmonary edema with moderate in size bilateral pleural effusions. More focal airspace consolidation in the right lung base cannot be excluded. Electronically Signed   By: Fidela Salisbury M.D.   On: 02/01/2018 09:36   Dg Chest Port 1 View  Result Date: 01/31/2018 CLINICAL DATA:  Check endotracheal tube EXAM: PORTABLE CHEST 1 VIEW COMPARISON:  01/30/2018 FINDINGS: Endotracheal tube is noted at the thoracic inlet. Bilateral jugular catheters are again noted and stable. Cardiac shadow is stable but enlarged. Persistent right-sided infiltrate and effusion is seen. The left lung is clear. IMPRESSION: Right-sided effusion and infiltrate stable from the prior exam. Tubes and lines as described. Electronically Signed   By: Inez Catalina M.D.   On: 01/31/2018 10:49   Dg Chest Port 1 View  Result Date: 01/30/2018 CLINICAL DATA:  Central line  placement EXAM: PORTABLE CHEST 1 VIEW COMPARISON:  01/30/2018 at 0918 hours FINDINGS: Endotracheal tube terminates 6 cm above the carina. Right IJ dual lumen catheter terminates in the lower SVC. Left IJ venous catheter terminates in the left brachiocephalic vein. Cardiomegaly with mild to moderate interstitial edema. Multifocal pneumonia is also possible. Suspected bilateral pleural effusions, right greater than left. No pneumothorax. IMPRESSION: Endotracheal tube terminates 6 cm above the carina. Right IJ catheter terminates in the lower SVC. Left IJ catheter terminates in the left brachiocephalic vein. Cardiomegaly with suspected mild to moderate interstitial edema and bilateral pleural effusions. Multifocal pneumonia is also possible. Electronically Signed   By: Julian Hy M.D.   On: 01/30/2018 21:42   Dg Chest Port 1 View  Result Date: 01/30/2018 CLINICAL DATA:  57 y.o. male.with a history of type 2 diabetes hypertension and sleep apnea who was brought to the ED by his spouse in cardiac and respiratory arrest. now on vent, smoker EXAM: PORTABLE CHEST 1 VIEW COMPARISON:  Chest x-rays dated 01/28/2018 and 01/24/2018. FINDINGS: Study is hypoinspiratory. There is continued bilateral pulmonary edema pattern, not significantly changed compared to the most recent study of 01/28/2018, increased compared to the earlier study of 01/24/2018. Given the low lung volumes, heart size and mediastinal contours appear stable. Endotracheal tube tip is at the level of the clavicles, approximately 6 cm above the carina. LEFT jugular vein central line is stable in position with tip at the level of the upper SVC. No pneumothorax seen. Probable small bilateral pleural effusions. IMPRESSION: 1. Continued bilateral pulmonary edema pattern, not significantly changed compared to most recent chest x-ray of 01/28/2018. 2. Probable small bilateral pleural effusions. 3. Cardiomegaly. 4. Support apparatus appears stable in position.  Endotracheal tube tip is at the level of the clavicles, approximately 6 cm above the carina. Electronically Signed   By: Franki Cabot M.D.   On: 01/30/2018 09:37   Dg Chest Port 1 View  Result Date: 01/28/2018 CLINICAL DATA:  Line placement EXAM: PORTABLE CHEST 1 VIEW COMPARISON:  01/28/2018 FINDINGS: Endotracheal tube measures 6.8 cm above the carina. A left central venous catheter has been placed with tip over the confluence of the brachiocephalic vein and IVC. Shallow inspiration. Cardiac enlargement. Bilateral perihilar infiltrates. Probable small right pleural effusion. No pneumothorax is visible. IMPRESSION: Appliances appear in satisfactory position. Cardiac enlargement with bilateral perihilar infiltrates. Small right pleural effusion. No pneumothorax. Electronically Signed   By: Lucienne Capers M.D.   On: 01/28/2018 05:52   Dg Chest Portable 1 View  Result Date: 01/28/2018 CLINICAL DATA:  Short of breath EXAM: PORTABLE CHEST 1 VIEW COMPARISON:  01/24/2018, 01/21/2018 FINDINGS: Interval intubation, tip of the endotracheal tube is about 4 cm superior to the carina. Worsening bilateral interstitial and alveolar disease. No large effusion. Stable mild cardiomegaly. No pneumothorax. IMPRESSION: 1. Endotracheal tube tip about 3.5 cm superior to the carina. 2. Cardiomegaly. Interim worsening of bilateral interstitial and alveolar airspace disease. Electronically Signed   By: Donavan Foil M.D.   On: 01/28/2018 00:29   Dg Chest Port 1 View  Result Date: 01/24/2018 CLINICAL DATA:  Respiratory failure EXAM: PORTABLE CHEST 1 VIEW COMPARISON:  01/21/2018, 01/20/2018, 12/13/2017 FINDINGS: Removal of the endotracheal tube. Slight increased upper lobe ground-glass opacity. Improved aeration at the bases. Stable slightly enlarged cardiomediastinal silhouette. No pneumothorax. IMPRESSION: 1. Removal of endotracheal tube 2. Improved aeration at the bilateral lung bases. Slight interval increase in bilateral  upper lobe ground-glass opacity. 3. Stable mild cardiomegaly Electronically Signed   By: Donavan Foil M.D.   On: 01/24/2018 03:58   Dg Chest Port 1 View  Result Date: 01/21/2018 CLINICAL DATA:  Acute respiratory failure. EXAM: PORTABLE CHEST 1 VIEW COMPARISON:  01/20/2018 FINDINGS: The endotracheal tube is in good position at the mid tracheal level. The lungs demonstrate improved aeration with resolving edema and atelectasis. Probable persistent small layering right pleural effusion. IMPRESSION: Stable position of the endotracheal tube. Improved lung aeration with resolving edema and atelectasis. Electronically Signed   By: Marijo Sanes M.D.   On: 01/21/2018 08:36   Dg Chest Portable 1 View  Result Date: 01/20/2018 CLINICAL DATA:  57 y/o  M; status post intubation. EXAM: PORTABLE CHEST 1 VIEW COMPARISON:  01/20/2018 chest radiograph FINDINGS: Stable cardiomegaly given projection and technique. Interstitial and alveolar pulmonary edema. Probable small effusions. Endotracheal tube tip 4.5 cm above the carina. Transcutaneous pacing pads noted. Bones are unremarkable. IMPRESSION: 1. Endotracheal tube tip 4.5 cm above the carina. 2. Stable cardiomegaly and pulmonary edema. Probable small effusions. Electronically Signed   By: Kristine Garbe M.D.   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 Stable cardiomegaly. Nonaneurysmal thoracic aorta. Diffuse bilateral airspace disease and vascular congestion consistent with pulmonary edema. No significant effusion or pneumothorax. No acute osseous abnormality. IMPRESSION: Stable cardiomegaly with pulmonary edema. Superimposed pneumonia is not entirely excluded but believed less likely. Electronically Signed   By: Ashley Royalty M.D.   On: 01/20/2018 02:56   Dg Addison Bailey G  Tube Plc W/fl W/rad  Result Date: 01/20/2018 CLINICAL DATA:  Patient presents for Dobbhoff tube placement. EXAM: NASO G TUBE PLACEMENT WITH FL AND WITH RAD FLUOROSCOPY TIME:  Fluoroscopy Time:  0.3 minute Radiation Exposure Index (if provided by the fluoroscopic device): 1.3 mGy Number of Acquired Spot Images: 0 COMPARISON:  None. FINDINGS: Multiple attempts at placing a Dobbhoff tube were made through the right and left nare. The tip of the Dobbhoff tube would not progressed beyond the level of the mid trachea on repeated attempts. Attempts were made at advancing the Dobbhoff tube following deflating the tracheostomy cuff, but the Dobbhoff tube could not advance any further. Examination was terminated at this time. IMPRESSION: 1. Multiple unsuccessful attempts at placing a Dobbhoff tube which would not progressed beyond the level of the mid trachea Electronically Signed   By: Kathreen Devoid   On: 01/20/2018 16:41    Consults: Treatment Team:  Teodoro Spray, MD Leotis Pain,  MD Anthonette Legato, MD Carloyn Manner, MD   Subjective:    Overnight Issues:no issues overnight, diminished secretions  Objective:  Vital signs for last 24 hours: Temp:  [97.7 F (36.5 C)-99 F (37.2 C)] 98.2 F (36.8 C) (08/05 0700) Pulse Rate:  [77-88] 78 (08/05 0700) Resp:  [21-28] 22 (08/05 0700) BP: (109-138)/(60-76) 125/76 (08/05 0700) SpO2:  [96 %-100 %] 97 % (08/05 0700) FiO2 (%):  [28 %] 28 % (08/05 0600) Weight:  [257 lb 15 oz (117 kg)-258 lb 6.1 oz (117.2 kg)] 258 lb 6.1 oz (117.2 kg) (08/05 0500)  Hemodynamic parameters for last 24 hours:    Intake/Output from previous day: 08/04 0701 - 08/05 0700 In: 2615 [I.V.:1341.7; NG/GT:1000; IV Piggyback:273.3] Out: 577 [Urine:577]  Intake/Output this shift: No intake/output data recorded.  Vent settings for last 24 hours: Vent Mode: PRVC FiO2 (%):  [28 %] 28 % Set Rate:  [25 bmp] 25 bmp Vt Set:  [500 mL] 500 mL PEEP:  [5 cmH20] 5  cmH20 Plateau Pressure:  [17 HUD14-97 cmH20] 21 cmH20  Physical Exam:  Vital signs: . Please see the above listed vital signs HEENT: Patient is orally intubated, trachea is midline, no accessory muscle utilization Cardiovascular: Regular rate and rhythm Pulmonary: Coarse rhonchi Abdominal: Hypoactive bowel sounds, soft exam, g-tube in place Extremities: No clubbing cyanosis or edema noted Neurologic: Patient does have occasional myoclonus otherwise unchanged neurologic exam   Assessment/Plan:  57 year old male with past medical history of ischemic cardiomyopathy ejection fraction of 20 to 02%, chronic systolic CHF, Obstructive sleep apnea, hypertension, diabetes who presented to the hospital as he was found unresponsive and noted to be in pulseless cardiac arrest     1.Severe Hypoxic and Hypercapnic Respiratory Failure -continue Full MV support -continue Bronchodilator Therapy -Wean Fio2 and PEEP as tolerated Respiratory failure-patient was intubated after his cardiac arrest.  Not showing any signs of meaningful neurological recovery.  Family wants to continue aggressive care.  -plan for trach next week. -Patient has increasing secretions and thought to have pneumonia suspected aspiration.  Status post bronchoscopy yesterday but as per intensivist not much secretions were removed.  FiO2 down to 28%.    2.Status post cardiac arrest.  Status post targeted temperature management. No significant improvement in neurologic status, anoxic brain injury Appreciate neurology. Pending tracheostomy   Pulseless cardiac arrest-as a result of worsening respiratory failure and flash pulmonary edema. - Patient was resuscitated in the ER for about 17 minutes prior to regaining spontaneous circulation. - No further arrhythmias but prognosis is quite poor    3.CHF History of severe cardiomyopathy with systolic failure. Measured ejection fraction of 15% on echocardiogram yesterday  4.Renal  Failure-most likely due to ATN -follow chem 7 -follow UO -continue Foley Catheter-assess need HD as needed    Critical Care Time devoted to patient care services described in this note is 43 minutes.   Overall, patient is critically ill, prognosis is guarded.  Patient with Multiorgan failure and at high risk for cardiac arrest and death.    Corrin Parker, M.D.  [facility]  Medical Director [place] Director [facility] Cardio-Pulmonary Department

## 2018-02-07 DIAGNOSIS — Z515 Encounter for palliative care: Secondary | ICD-10-CM

## 2018-02-07 DIAGNOSIS — Z7189 Other specified counseling: Secondary | ICD-10-CM

## 2018-02-07 LAB — RENAL FUNCTION PANEL
ALBUMIN: 1.8 g/dL — AB (ref 3.5–5.0)
Anion gap: 13 (ref 5–15)
BUN: 102 mg/dL — ABNORMAL HIGH (ref 6–20)
CO2: 25 mmol/L (ref 22–32)
Calcium: 7.7 mg/dL — ABNORMAL LOW (ref 8.9–10.3)
Chloride: 100 mmol/L (ref 98–111)
Creatinine, Ser: 5.53 mg/dL — ABNORMAL HIGH (ref 0.61–1.24)
GFR calc Af Amer: 12 mL/min — ABNORMAL LOW (ref >60)
GFR calc non Af Amer: 10 mL/min — ABNORMAL LOW (ref >60)
GLUCOSE: 118 mg/dL — AB (ref 70–99)
Phosphorus: 10.5 mg/dL — ABNORMAL HIGH (ref 2.5–4.6)
Potassium: 4.8 mmol/L (ref 3.5–5.1)
Sodium: 138 mmol/L (ref 135–145)

## 2018-02-07 LAB — CBC
HEMATOCRIT: 23.7 % — AB (ref 40.0–52.0)
HEMOGLOBIN: 8.2 g/dL — AB (ref 13.0–18.0)
MCH: 31.8 pg (ref 26.0–34.0)
MCHC: 34.6 g/dL (ref 32.0–36.0)
MCV: 92 fL (ref 80.0–100.0)
Platelets: 245 10*3/uL (ref 150–440)
RBC: 2.57 MIL/uL — AB (ref 4.40–5.90)
RDW: 13.4 % (ref 11.5–14.5)
WBC: 22.2 10*3/uL — ABNORMAL HIGH (ref 3.8–10.6)

## 2018-02-07 LAB — TRIGLYCERIDES: TRIGLYCERIDES: 437 mg/dL — AB (ref <150)

## 2018-02-07 LAB — GLUCOSE, CAPILLARY
GLUCOSE-CAPILLARY: 152 mg/dL — AB (ref 70–99)
Glucose-Capillary: 102 mg/dL — ABNORMAL HIGH (ref 70–99)
Glucose-Capillary: 102 mg/dL — ABNORMAL HIGH (ref 70–99)
Glucose-Capillary: 141 mg/dL — ABNORMAL HIGH (ref 70–99)
Glucose-Capillary: 112 mg/dL — ABNORMAL HIGH (ref 70–99)

## 2018-02-07 MED ORDER — MIDAZOLAM HCL 2 MG/2ML IJ SOLN: 2.0000 mg | INTRAMUSCULAR | Status: DC | PRN

## 2018-02-07 MED ORDER — FENTANYL BOLUS VIA INFUSION
50.0000 ug | INTRAVENOUS | Status: DC | PRN
  Administered 2018-02-09: 50 ug via INTRAVENOUS
  Filled 2018-02-07: qty 50

## 2018-02-07 MED ORDER — FENTANYL CITRATE (PF) 100 MCG/2ML IJ SOLN
50.0000 ug | Freq: Once | INTRAMUSCULAR | Status: AC
  Administered 2018-02-07: 50 ug via INTRAVENOUS
  Filled 2018-02-07: qty 2

## 2018-02-07 MED ORDER — SODIUM CHLORIDE 0.9 % IV SOLN
0.5000 mg/h | INTRAVENOUS | Status: DC
  Administered 2018-02-07: 0.5 mg/h via INTRAVENOUS
  Administered 2018-02-08: 1 mg/h via INTRAVENOUS
  Filled 2018-02-07 (×2): qty 10

## 2018-02-07 MED ORDER — FUROSEMIDE 10 MG/ML IJ SOLN: 40.0000 mg | Freq: Once | INTRAMUSCULAR | Status: DC

## 2018-02-07 MED ORDER — MIDAZOLAM HCL 2 MG/2ML IJ SOLN
2.0000 mg | Freq: Once | INTRAMUSCULAR | Status: AC
  Administered 2018-02-07: 2 mg via INTRAVENOUS
  Filled 2018-02-07: qty 2

## 2018-02-07 MED ORDER — FENTANYL 2500MCG IN NS 250ML (10MCG/ML) PREMIX INFUSION
25.0000 ug/h | INTRAVENOUS | Status: DC
  Administered 2018-02-07: 50 ug/h via INTRAVENOUS
  Administered 2018-02-08 (×2): 200 ug/h via INTRAVENOUS
  Administered 2018-02-08 – 2018-02-09 (×2): 300 ug/h via INTRAVENOUS
  Administered 2018-02-09: 400 ug/h via INTRAVENOUS
  Filled 2018-02-07 (×6): qty 250

## 2018-02-07 NOTE — Progress Notes (Signed)
[facility]  ROUNDING NOTE   Subjective:   UOP 1495  Furosemide 42m IV x 1 yesterday.   Creatinine 5.53 (4.76) BUN 102 (84)  Objective:  Vital signs in last 24 hours:  Temp:  [98.1 F (36.7 C)-99.3 F (37.4 C)] 98.4 F (36.9 C) (08/06 0800) Pulse Rate:  [81-96] 90 (08/06 0800) Resp:  [0-34] 18 (08/06 0800) BP: (118-162)/(68-89) 141/78 (08/06 0800) SpO2:  [88 %-100 %] 97 % (08/06 0800) FiO2 (%):  [28 %] 28 % (08/06 0808) Weight:  [123 kg (271 lb 2.7 oz)] 123 kg (271 lb 2.7 oz) (08/06 0500)  Weight change: 6 kg (13 lb 3.6 oz) Filed Weights   02/05/18 0800 02/06/18 0500 02/07/18 0500  Weight: 117 kg (257 lb 15 oz) 117.2 kg (258 lb 6.1 oz) 123 kg (271 lb 2.7 oz)    Intake/Output: I/O last 3 completed shifts: In: 4068.7 [I.V.:1727.7; Other:30; NG/GT:1440; IV PBWIOMBTDH:741]Out: 16384[[TXMIW:8032]  Intake/Output this shift:  Total I/O In: 84.2 [I.V.:44.2; NG/GT:40] Out: 150 [Urine:150]  Physical Exam: General: Critically ill  Head: ETT  Eyes: Anicteric   Neck: RIJ LIJ lines  Lungs:  Bilateral crackles, PRVC FiO2 28%  Heart: Regular rate and rhythm  Abdomen:  Soft, nontender, +PEG  Extremities: ++ peripheral edema.  Neurologic: Intubated, on sedation  Skin: No lesions  Access: RIJ temp HD catheter 7/29 Tukov    Basic Metabolic Panel: Recent Labs  Lab 02/15/2018 0012 02/13/2018 0432  02/03/18 0509 02/04/18 1633 02/05/18 1139 02/05/18 1643 02/06/18 0503 02/07/18 0506  NA 136 137  --  140  --  138  --  138 138  K 5.0 4.9  --  4.5  --  4.2  --  4.4 4.8  CL 103 104  --  105  --  100  --  98 100  CO2 27 26  --  29  --  28  --  27 25  GLUCOSE 140* 105*  --  93  --  173*  --  113* 118*  BUN 39* 39*  --  51*  --  68*  --  84* 102*  CREATININE 2.39* 2.56*  --  3.43*  --  3.99*  --  4.76* 5.53*  CALCIUM 8.2* 8.4*  --  8.0*  --  7.6*  --  7.7* 7.7*  MG 2.3 2.2  --   --   --  2.4 2.4 2.5*  --   PHOS 4.4 4.1   < >  --  6.8* 6.4* 6.9* 7.8*  7.8* 10.5*    < > = values in this interval not displayed.    Liver Function Tests: Recent Labs  Lab 02/01/18 1645 02/10/2018 0012 02/06/2018 0432 02/06/18 0503 02/07/18 0506  ALBUMIN 2.1* 2.0* 2.1* 1.7* 1.8*   No results for input(s): LIPASE, AMYLASE in the last 168 hours. No results for input(s): AMMONIA in the last 168 hours.  CBC: Recent Labs  Lab 02/01/18 0446 03/01/2018 0432 02/05/18 1139 02/06/18 0929 02/07/18 0506  WBC 23.8* 19.4* 22.4* 23.2* 22.2*  NEUTROABS 19.7* 14.9* 18.3*  --   --   HGB 8.9* 8.9* 8.2* 8.1* 8.2*  HCT 26.3* 26.4* 24.5* 23.6* 23.7*  MCV 91.6 91.9 92.8 92.5 92.0  PLT 100* 111* 172 203 245    Cardiac Enzymes: No results for input(s): CKTOTAL, CKMB, CKMBINDEX, TROPONINI in the last 168 hours.  BNP: Invalid input(s): POCBNP  CBG: Recent Labs  Lab 02/06/18 1548 02/06/18 1957 02/06/18 2338 02/07/18 0412 02/07/18  [place]*    Microbiology: Results for orders placed or performed during the hospital encounter of 01/16/2018  MRSA PCR Screening     Status: None   Collection Time: 01/28/18  5:00 AM  Result Value Ref Range Status   MRSA by PCR NEGATIVE NEGATIVE Final    Comment:        The GeneXpert MRSA Assay (FDA approved for NASAL specimens only), is one component of a comprehensive MRSA colonization surveillance program. It is not intended to diagnose MRSA infection nor to guide or monitor treatment for MRSA infections. Performed at [facility], [address], [place], [place] [address]   Urine Culture     Status: None   Collection Time: 01/28/18  6:16 AM  Result Value Ref Range Status   Specimen Description   Final    URINE, RANDOM Performed at [facility], [address] [place], [place] [address]    Special Requests   Final    NONE Performed at [facility], [address] [place], [place] [address]    Culture   Final    NO GROWTH Performed at [facility], [place] [address], [place], [place] [address]    Report Status 01/29/2018 FINAL  Final  Culture, blood (Routine X 2) w Reflex to ID Panel     Status: None   Collection Time: 01/28/18  6:45 AM  Result Value Ref Range Status   Specimen Description BLOOD LAC  Final   Special Requests   Final    BOTTLES DRAWN AEROBIC AND ANAEROBIC Blood Culture adequate volume   Culture   Final    NO GROWTH 5 DAYS Performed at [facility], [address] [place], [place] [address]    Report Status 02/28/2018 FINAL  Final  Culture, blood (Routine X 2) w Reflex to ID Panel     Status: None   Collection Time: 01/28/18  6:56 AM  Result Value Ref Range Status   Specimen Description BLOOD RAC  Final   Special Requests   Final    BOTTLES DRAWN AEROBIC AND ANAEROBIC Blood Culture results may not be optimal due to an inadequate volume of blood received in culture bottles   Culture   Final    NO GROWTH 5 DAYS Performed at [facility], [address] [place], [place] [address]    Report Status 02/14/2018 FINAL  Final  Culture, respiratory (non-expectorated)     Status: None   Collection Time: 01/28/18 11:24 AM  Result Value Ref Range Status   Specimen Description   Final    TRACHEAL ASPIRATE Performed at [facility], [address] [place], [place] [address]    Special Requests   Final    NONE Performed at [facility], [address] [place], [place] [address]    Gram Stain   Final    RARE WBC PRESENT,BOTH PMN AND MONONUCLEAR RARE GRAM POSITIVE COCCI IN PAIRS    Culture   Final    RARE Consistent with normal respiratory flora. Performed at [facility], [place] [address], [place], [place] [address]    Report Status 01/30/2018 FINAL  Final  Culture, respiratory (non-expectorated)     Status: None (Preliminary result)   Collection Time: 02/03/18  7:53 AM  Result Value Ref Range Status   Specimen Description   Final    TRACHEAL ASPIRATE Performed at [facility], [address] [place], [place] [address]    Special Requests   Final    NONE Performed at [facility], [address] [place], [place] [address]    Gram Stain   Final    ABUNDANT WBC PRESENT, PREDOMINANTLY PMN FEW GRAM NEGATIVE RODS    Culture   Final    FEW BURKHOLDERIA SPECIES Sent to [facility] for further susceptibility testing. Performed at [facility], [place] [address], [place], [place] [address]    Report Status PENDING  Incomplete    Coagulation Studies: No results for input(s): LABPROT, INR in the last 72 hours.  Urinalysis: No results for input(s): COLORURINE, LABSPEC, PHURINE, GLUCOSEU, HGBUR, BILIRUBINUR, KETONESUR, PROTEINUR, UROBILINOGEN, NITRITE, LEUKOCYTESUR in the last 72 hours.  Invalid input(s): APPERANCEUR    Imaging: Dg Chest Port 1 View  Result Date: 02/05/2018 CLINICAL DATA:  Mechanically assisted ventilation. EXAM: PORTABLE CHEST 1 VIEW COMPARISON:  02/03/2018 FINDINGS: Endotracheal tube terminates 5.5 cm above the carina. Right jugular catheter terminates over the mid SVC. Left jugular catheter terminates over the upper SVC. The cardiac silhouette remains enlarged with similar appearance of pulmonary vascular congestion. Right greater than left lower lung hazy opacities are also similar to the prior study and likely represent small pleural effusions and atelectasis. No pneumothorax is identified. IMPRESSION: Unchanged pulmonary vascular congestion, pleural effusions, and bibasilar atelectasis. Electronically Signed   By: Logan Bores M.D.   On: 02/05/2018 11:30     Medications:   . sodium chloride Stopped (02/25/2018 1838)  . sodium chloride 10 mL/hr at 02/07/18 0400  . levETIRAcetam (KEPPRA) IVPB Stopped (02/07/18 0220)  . meropenem (MERREM) IV Stopped (02/06/18 1047)  . propofol (DIPRIVAN) infusion 30 mcg/kg/min (02/07/18 0736)   . B-complex with vitamin C  1 tablet Per Tube QHS  . chlorhexidine gluconate (MEDLINE KIT)   15 mL Mouth Rinse BID  . famotidine  20 mg Per Tube QHS  . feeding supplement (PRO-STAT SUGAR FREE 64)  30 mL Per Tube BID  . feeding supplement (VITAL HIGH PROTEIN)  1,000 mL Per Tube Q24H  . heparin injection (subcutaneous)  5,000 Units Subcutaneous Q8H  . insulin aspart  0-20 Units Subcutaneous Q4H  . insulin glargine  5 Units Subcutaneous QHS  . ipratropium-albuterol  3 mL Nebulization Q6H  . mouth rinse  15 mL Mouth Rinse 10 times per day  . metoprolol tartrate  5 mg Intravenous Q6H  . multivitamin  15 mL Per Tube Daily  . sodium chloride flush  10-40 mL Intracatheter Q12H   sodium chloride, acetaminophen **OR** acetaminophen, fentaNYL, heparin, hydrALAZINE, sodium chloride flush  Assessment/ Plan:  [name] is a 57 y.o. black male with diabetes mellitus type 2, hypertension, obstructive sleep apnea, who was [facility] to [facility] on[facility]26[facility]2019for evaluation of cardiac arrest that occurred at home.   1. Acute renal failure, oliguric. Baseline creatinine 0.68 12/13/17 2. Metabolic acidosis   3. Acute respiratory failure requiring mechanical ventilation  4. Cardiac arrest, completed hypothermia protocol. 5. Anemia with renal failure. 6. Hyperphosphatemia  Plan: Patient completed hemodialysis on 8/3.  No indication for dialysis today.  Last hemodialysis treatment was 8/3.  Nonoliguric urine output.  If further dialysis treatment requiring, then will proceed with permcath placement.  Holding furosemide due to azotemia.    LOS: [address] [place] 8/6/20198:57 AM

## 2018-02-07 NOTE — Progress Notes (Signed)
I have spoken to Wife of Patient regarding plan of care. She has consented and agreed to DNR status and will place orders.   After further discussion with wife, she has planned to make patient comfort care on Friday August 9th.  She understands that making patient comfrotable is to withdraw all machines and medications and life support. She has stated that she does NOT want him to suffer anymore.  Family are satisfied with Plan of action and management. All questions answered  Corrin Parker, M.D.  [facility]  Medical Director [place] Director [facility] Cardio-Pulmonary Department

## 2018-02-07 NOTE — Progress Notes (Signed)
Per Dr. Mortimer Fries sedation turned off at 1239 when wife arrived to room.  Patient became diaphoretic with increased intensity in myoclonic jerks to lower extremities and head Per palliative care NP patient looks very uncomfortable.  Fentanyl bolus and drip started.

## 2018-02-07 NOTE — Progress Notes (Signed)
Pt has a bite block in place. Moved bite block when ETT was repositioned. No signs of damage or breakdown

## 2018-02-07 NOTE — Consult Note (Signed)
Consultation Note Date: 02/07/2018   Patient Name: [name]  DOB: [birth year]  MRN: [id number]  Age / Sex: 57 y.o., male  PCP: Marguerita Merles, MD Referring Physician: Nicholes Mango, MD  Reason for Consultation: Establishing goals of care  HPI/Patient Profile: [name]  is a 57 y.o. male who presented to the ED brought by his family.  Patient was brought in from the parking lot with pink frothy sputum and found to be pulseless.  He underwent cardiopulmonary resuscitation in the ED with return of spontaneous circulation.  He was intubated and hospitalist were called for admission.  Of note, patient was admitted here on 7/19 for similar situation of flash pulmonary edema requiring intubation.      Clinical Assessment and Goals of Care: Patient is resting in bed. He appears very uncomfortable, he is diaphoretic with myoclonic jerks in lower extremities. His wife [name] and her aunt are at bedside. She states she and her husband have been married 17 years. He has no children.  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 [name] states immediately prior to his previous admission, he began having edema and was a bit more fatigued. She states he was admitted previously and placed on a ventilator, and was extubated and discharged. She identifies the previous admission as similar to this one. We discussed that this admission is different as he had CPR for 17 minutes.    We discussed quality of life. She states he would not want to live as he currently is. She tells me his family has stated he would not want to live like this. Neurology in to meeting. Discussion of his brain injury and his need for a trach, if moving forward with aggressive care. Per conversation with CCM, discussed need for move to an outside hospital for trach placement.  She states she knows what needs to be done, and what he would want,  identifying she nor he would want to continue to live in this situation,  but she needs a few more days to think about it before making the decision.      SUMMARY OF RECOMMENDATIONS   Wife considering continued aggressive care vs transition to comfort care. If made comfort care, anticipate a hospital death.    Code Status/Advance Care Planning:  Full code    Symptom Management:   Per primary team.  Palliative Prophylaxis:   Eye Care and Oral Care  Prognosis:   Very poor.   Discharge Planning: To Be Determined      Primary Diagnoses: Present on Admission: . Cardiopulmonary arrest with successful resuscitation ([place]) . Acute on chronic combined systolic and diastolic CHF (congestive heart failure) ([place]) . Accelerated hypertension   I have reviewed the medical record, interviewed the patient and family, and examined the patient. The following aspects are pertinent.  Past Medical History:  Diagnosis Date  . Chest pain 2019  . Diabetes mellitus without complication ([place])   . Hypertension   . Sleep apnea    Social History  Socioeconomic History  . Marital status: Married    Spouse name: Not on file  . Number of children: Not on file  . Years of education: Not on file  . Highest education level: Not on file  Occupational History  . Not on file  Social Needs  . Financial resource strain: Not on file  . Food insecurity:    Worry: Not on file    Inability: Not on file  . Transportation needs:    Medical: Not on file    Non-medical: Not on file  Tobacco Use  . Smoking status: Current Every Day Smoker    Packs/day: 0.30    Years: 32.00    Pack years: 9.60    Types: Cigarettes  . Smokeless tobacco: Never Used  Substance and Sexual Activity  . Alcohol use: No  . Drug use: Never  . Sexual activity: Not Currently  Lifestyle  . Physical activity:    Days per week: Not on file    Minutes per session: Not on file  . Stress: Not on file  Relationships  .  Social connections:    Talks on phone: Not on file    Gets together: Not on file    Attends religious service: Not on file    Active member of club or organization: Not on file    Attends meetings of clubs or organizations: Not on file    Relationship status: Not on file  Other Topics Concern  . Not on file  Social History Narrative  . Not on file   Family History  Problem Relation Age of Onset  . Diabetes Father   . Colon cancer Neg Hx    Scheduled Meds: . B-complex with vitamin C  1 tablet Per Tube QHS  . chlorhexidine gluconate (MEDLINE KIT)  15 mL Mouth Rinse BID  . famotidine  20 mg Per Tube QHS  . feeding supplement (PRO-STAT SUGAR FREE 64)  30 mL Per Tube BID  . feeding supplement (VITAL HIGH PROTEIN)  1,000 mL Per Tube Q24H  . heparin injection (subcutaneous)  5,000 Units Subcutaneous Q8H  . insulin aspart  0-20 Units Subcutaneous Q4H  . insulin glargine  5 Units Subcutaneous QHS  . ipratropium-albuterol  3 mL Nebulization Q6H  . mouth rinse  15 mL Mouth Rinse 10 times per day  . metoprolol tartrate  5 mg Intravenous Q6H  . multivitamin  15 mL Per Tube Daily  . sodium chloride flush  10-40 mL Intracatheter Q12H   Continuous Infusions: . sodium chloride Stopped (02/25/2018 1838)  . sodium chloride 10 mL/hr at 02/07/18 0400  . fentaNYL infusion INTRAVENOUS 75 mcg/hr (02/07/18 1431)  . meropenem (MERREM) IV Stopped (02/07/18 1039)  . midazolam (VERSED) infusion     PRN Meds:.sodium chloride, acetaminophen **OR** acetaminophen, fentaNYL, heparin, hydrALAZINE, midazolam, sodium chloride flush Medications Prior to Admission:  Prior to Admission medications   Medication Sig Start Date End Date Taking? Authorizing Provider  ALPRAZolam Duanne Moron) 0.5 MG tablet Take 0.5 mg by mouth 2 (two) times daily as needed for anxiety. 01/26/18  Yes [provider]  aspirin EC 81 MG tablet Take 81 mg by mouth daily.   Yes [provider]  furosemide (LASIX) 20 MG tablet  Take 1 tablet (20 mg total) by mouth daily. 01/25/18  Yes Salary, [name], MD  hydrALAZINE (APRESOLINE) 25 MG tablet Take 1 tablet (25 mg total) by mouth every 8 (eight) hours. 01/24/18  Yes Salary, [name], MD  metoprolol tartrate (  LOPRESSOR) 50 MG tablet Take 1 tablet (50 mg total) by mouth 2 (two) times daily. 01/24/18  Yes Salary, [name], MD  HYDROcodone-acetaminophen (NORCO/VICODIN) 5-325 MG tablet Take 1 tablet by mouth every 4 (four) hours as needed. Patient not taking: Reported on 01/20/2018 12/13/17   Harvest Dark, MD  polyethylene glycol powder (GLYCOLAX/MIRALAX) powder Take 17 g by mouth daily as needed for moderate constipation. Patient not taking: Reported on 01/20/2018 12/13/17   Harvest Dark, MD   Allergies  Allergen Reactions  . Enalapril Maleate Anaphylaxis  . Iodinated Diagnostic Agents Itching  . Isosorbide Mononitrate Er [Isosorbide Dinitrate] Other (See Comments)    "made me not be able to think or function"  . Canagliflozin Other (See Comments)   Review of Systems  Unable to perform ROS   Physical Exam  Constitutional: He appears distressed.  Pulmonary/Chest:  Intubated  Neurological:  No sedation  Skin: He is diaphoretic.    Vital Signs: BP (!) 157/88   Pulse (!) 115   Temp 99.7 F (37.6 C)   Resp (!) 29   Ht _0  (1.854 m)   Wt 123 kg (271 lb 2.7 oz)   SpO2 (!) 84%   BMI 35.78 kg/m  Pain Scale: CPOT   Pain Score: 0-No pain   SpO2: SpO2: (!) 84 % O2 Device:SpO2: (!) 84 % O2 Flow Rate: .   IO: Intake/output summary:   Intake/Output Summary (Last 24 hours) at 02/07/2018 1503 Last data filed at 02/07/2018 1432 Gross per 24 hour  Intake 2663.9 ml  Output 1790 ml  Net 873.9 ml    LBM: Last BM Date: 02/07/18 Baseline Weight: Weight: 110.6 kg (243 lb 13.3 oz) Most recent weight: Weight: 123 kg (271 lb 2.7 oz)     Palliative Assessment/Data: intubated     Time In: 1:00 Time Out: 3:00 Time Total: 120 min Greater than 50%  of  this time was spent counseling and coordinating care related to the above assessment and plan.  Signed by: Asencion Gowda, NP   Please contact Palliative Medicine Team phone at [phone] for questions and concerns.  For individual provider: See Shea Evans

## 2018-02-07 NOTE — Progress Notes (Signed)
Subjective: Patient remains unchanged.  Intubated.  Unable to tolerate not being sedated.    Objective: Current vital signs: BP (!) 148/77   Pulse (!) 113   Temp 99.7 F (37.6 C)   Resp (!) 24   Ht '6\' 1"'$  (1.854 m)   Wt 123 kg (271 lb 2.7 oz)   SpO2 93%   BMI 35.78 kg/m  Vital signs in last 24 hours: Temp:  [98.2 F (36.8 C)-99.7 F (37.6 C)] 99.7 F (37.6 C) (08/06 1515) Pulse Rate:  [84-115] 113 (08/06 1515) Resp:  [0-34] 24 (08/06 1515) BP: (118-162)/(68-90) 148/77 (08/06 1515) SpO2:  [84 %-100 %] 93 % (08/06 1515) FiO2 (%):  [28 %] 28 % (08/06 1319) Weight:  [123 kg (271 lb 2.7 oz)] 123 kg (271 lb 2.7 oz) (08/06 0500)  Intake/Output from previous day: 08/05 0701 - 08/06 0700 In: 2891.7 [I.V.:1080.7; NG/GT:960; IV Piggyback:821] Out: 0962 [Urine:1495] Intake/Output this shift: Total I/O In: 603.8 [I.V.:173.8; Other:30; NG/GT:200; IV Piggyback:200] Out: 700 [Urine:700] Nutritional status:  Diet Order           Diet NPO time specified  Diet effective now          Neurologic Exam: Mental Status: Patient does not respond to verbal stimuli. Does not respond to deep sternal rub. Does not follow commands. No verbalizations noted.  Cranial Nerves: II: patient does not respond confrontation bilaterally, pupils right33m, left 428mand reactivebilaterally III,IV,VI: doll's responsepresent bilaterally.  Eyes deviated upward V,VII: corneal reflexpresentbut reduced on the right  VIII: patient does not respond to verbal stimuli IX,X: gag reflexabsent, XI: trapezius strength unable to test bilaterally XII: tongue strength unable to test Motor: Extremities flaccid throughout. No purposeful movements noted. Twitching noted of the toes bilaterally Sensory: Does not respond to noxious stimuli in any extremity. Deep Tendon Reflexes: 1+ at the biceps, absentotherwise.    Lab Results: Basic Metabolic Panel: Recent Labs  Lab 02/28/2018 0012 02/19/2018 0432   02/03/18 0509 02/04/18 1633 02/05/18 1139 02/05/18 1643 02/06/18 0503 02/07/18 0506  NA 136 137  --  140  --  138  --  138 138  K 5.0 4.9  --  4.5  --  4.2  --  4.4 4.8  CL 103 104  --  105  --  100  --  98 100  CO2 27 26  --  29  --  28  --  27 25  GLUCOSE 140* 105*  --  93  --  173*  --  113* 118*  BUN 39* 39*  --  51*  --  68*  --  84* 102*  CREATININE 2.39* 2.56*  --  3.43*  --  3.99*  --  4.76* 5.53*  CALCIUM 8.2* 8.4*  --  8.0*  --  7.6*  --  7.7* 7.7*  MG 2.3 2.2  --   --   --  2.4 2.4 2.5*  --   PHOS 4.4 4.1   < >  --  6.8* 6.4* 6.9* 7.8*  7.8* 10.5*   < > = values in this interval not displayed.    Liver Function Tests: Recent Labs  Lab 02/01/18 1645 02/10/2018 0012 02/05/2018 0432 02/06/18 0503 02/07/18 0506  ALBUMIN 2.1* 2.0* 2.1* 1.7* 1.8*   No results for input(s): LIPASE, AMYLASE in the last 168 hours. No results for input(s): AMMONIA in the last 168 hours.  CBC: Recent Labs  Lab 02/01/18 0446 02/24/2018 0432 02/05/18 1139 02/06/18 0929 02/07/18 0506  WBC 23.8*  19.4* 22.4* 23.2* 22.2*  NEUTROABS 19.7* 14.9* 18.3*  --   --   HGB 8.9* 8.9* 8.2* 8.1* 8.2*  HCT 26.3* 26.4* 24.5* 23.6* 23.7*  MCV 91.6 91.9 92.8 92.5 92.0  PLT 100* 111* 172 203 245    Cardiac Enzymes: No results for input(s): CKTOTAL, CKMB, CKMBINDEX, TROPONINI in the last 168 hours.  Lipid Panel: Recent Labs  Lab 02/01/18 0059 02/03/18 2312 02/07/18 0004  TRIG 219* 360* 437*    CBG: Recent Labs  Lab 02/06/18 1957 02/06/18 2338 02/07/18 0412 02/07/18 0715 02/07/18 1119  GLUCAP 118* 122* 102* 102* 152*    Microbiology: Results for orders placed or performed during the hospital encounter of 01/29/2018  MRSA PCR Screening     Status: None   Collection Time: 01/28/18  5:00 AM  Result Value Ref Range Status   MRSA by PCR NEGATIVE NEGATIVE Final    Comment:        The GeneXpert MRSA Assay (FDA approved for NASAL specimens only), is one component of a comprehensive MRSA  colonization surveillance program. It is not intended to diagnose MRSA infection nor to guide or monitor treatment for MRSA infections. Performed at [facility], [address], [place], [place] [address]   Urine Culture     Status: None   Collection Time: 01/28/18  6:16 AM  Result Value Ref Range Status   Specimen Description   Final    URINE, RANDOM Performed at [facility], [address], [place], [place] [address]    Special Requests   Final    NONE Performed at [facility], [address] [place], [place] [address]    Culture   Final    NO GROWTH Performed at [facility], [place] [address], [place], [place] [address]    Report Status 01/29/2018 FINAL  Final  Culture, blood (Routine X 2) w Reflex to ID Panel     Status: None   Collection Time: 01/28/18  6:45 AM  Result Value Ref Range Status   Specimen Description BLOOD LAC  Final   Special Requests   Final    BOTTLES DRAWN AEROBIC AND ANAEROBIC Blood Culture adequate volume   Culture   Final    NO GROWTH 5 DAYS Performed at [facility], [address] [place], [place] [address]    Report Status 02/23/2018 FINAL  Final  Culture, blood (Routine X 2) w Reflex to ID Panel     Status: None   Collection Time: 01/28/18  6:56 AM  Result Value Ref Range Status   Specimen Description BLOOD RAC  Final   Special Requests   Final    BOTTLES DRAWN AEROBIC AND ANAEROBIC Blood Culture results may not be optimal due to an inadequate volume of blood received in culture bottles   Culture   Final    NO GROWTH 5 DAYS Performed at [facility], [address] [place], [place] [address]    Report Status 03/02/2018 FINAL  Final  Culture, respiratory (non-expectorated)     Status: None   Collection Time: 01/28/18 11:24 AM  Result Value Ref Range Status   Specimen Description   Final    TRACHEAL ASPIRATE Performed at [facility] [place] [facility], [address] [place],  [place] [address]    Special Requests   Final    NONE Performed at [facility], [address], [place],  [address]    Gram Stain   Final  RARE WBC PRESENT,BOTH PMN AND MONONUCLEAR RARE GRAM POSITIVE COCCI IN PAIRS    Culture   Final    RARE Consistent with normal respiratory flora. Performed at [facility], [place] [address], [place], [place] [address]    Report Status 01/30/2018 FINAL  Final  Culture, respiratory (non-expectorated)     Status: None (Preliminary result)   Collection Time: 02/03/18  7:53 AM  Result Value Ref Range Status   Specimen Description   Final    TRACHEAL ASPIRATE Performed at [facility], [address] [place], [place] [address]    Special Requests   Final    NONE Performed at [facility], [address] [place], [place] [address]    Gram Stain   Final    ABUNDANT WBC PRESENT, PREDOMINANTLY PMN FEW GRAM NEGATIVE RODS    Culture   Final    FEW BURKHOLDERIA SPECIES Sent to [place] for further susceptibility testing. Performed at [facility], [place] [address], [place], [place] [address]    Report Status PENDING  Incomplete    Coagulation Studies: No results for input(s): LABPROT, INR in the last 72 hours.  Imaging: No results found.  Medications:  I have reviewed the patient's current medications. Scheduled: . B-complex with vitamin C  1 tablet Per Tube QHS  . chlorhexidine gluconate (MEDLINE KIT)  15 mL Mouth Rinse BID  . famotidine  20 mg Per Tube QHS  . feeding supplement (PRO-STAT SUGAR FREE 64)  30 mL Per Tube BID  . feeding supplement (VITAL HIGH PROTEIN)  1,000 mL Per Tube Q24H  . heparin injection (subcutaneous)  5,000 Units Subcutaneous Q8H  . insulin aspart  0-20 Units Subcutaneous Q4H  . insulin glargine  5 Units Subcutaneous QHS  . ipratropium-albuterol  3 mL Nebulization Q6H  . mouth rinse  15 mL Mouth Rinse 10 times per day  . metoprolol tartrate  5 mg Intravenous Q6H  .  multivitamin  15 mL Per Tube Daily  . sodium chloride flush  10-40 mL Intracatheter Q12H    Assessment/Plan: Patient remains intubated.  Requires sedation.  Spoke at length with wife and family member concerning patient's condition and prognosis.     LOS: 10 days   Alexis Goodell, MD Neurology [phone] 02/07/2018  3:38 PM

## 2018-02-07 NOTE — Progress Notes (Signed)
[facility] NAME: [name]    MR#:  [id number]  DATE OF BIRTH:  07-19-1960  SUBJECTIVE:   No other acute events overnight.  Remains on a low-dose propofol drip.  status post IR guided gastrostomy tube placement and now on tube feeds and tolerating it well.  Remains intubated  REVIEW OF SYSTEMS:    Review of Systems  Unable to perform ROS: Intubated    Nutrition: Tube feeds Tolerating Diet: Yes  DRUG ALLERGIES:   Allergies  Allergen Reactions  . Enalapril Maleate Anaphylaxis  . Iodinated Diagnostic Agents Itching  . Isosorbide Mononitrate Er [Isosorbide Dinitrate] Other (See Comments)    "made me not be able to think or function"  . Canagliflozin Other (See Comments)    VITALS:  Blood pressure (!) 157/88, pulse (!) 115, temperature 99.7 F (37.6 C), resp. rate (!) 29, height 6\' 1"  (1.854 m), weight 123 kg (271 lb 2.7 oz), SpO2 (!) 84 %.  PHYSICAL EXAMINATION:   Physical Exam  GENERAL:  57 y.o.-year-old patient lying in bed intubated & Sedated.  EYES: PERRL but minimally responsive.  HEENT: Head atraumatic, normocephalic. ET and OG tubes in place.   NECK:  Supple, no jugular venous distention. No thyroid enlargement, no tenderness.  LUNGS: Normal breath sounds bilaterally, no wheezing, rales, rhonchi. No use of accessory muscles of respiration.  CARDIOVASCULAR: S1, S2 normal. No murmurs, rubs, or gallops.  ABDOMEN: Soft, nontender, nondistended. Bowel sounds present. No organomegaly or mass.  + G-tube in place.  EXTREMITIES: No cyanosis, clubbing or edema b/l.    NEUROLOGIC: Sedated & Intubated.  Having some myoclonic jerks occasionally, positive clonus at times. PSYCHIATRIC: Sedated & Intubated.   SKIN: No obvious rash, lesion, or ulcer.    LABORATORY PANEL:   CBC Recent Labs  Lab 02/07/18 0506  WBC 22.2*  HGB 8.2*  HCT 23.7*  PLT 245    ------------------------------------------------------------------------------------------------------------------  Chemistries  Recent Labs  Lab 02/06/18 0503 02/07/18 0506  NA 138 138  K 4.4 4.8  CL 98 100  CO2 27 25  GLUCOSE 113* 118*  BUN 84* 102*  CREATININE 4.76* 5.53*  CALCIUM 7.7* 7.7*  MG 2.5*  --    ------------------------------------------------------------------------------------------------------------------  Cardiac Enzymes No results for input(s): TROPONINI in the last 168 hours. ------------------------------------------------------------------------------------------------------------------  RADIOLOGY:  No results found.   ASSESSMENT AND PLAN:   57 year old male with past medical history of ischemic cardiomyopathy ejection fraction of 20 to 97%, chronic systolic CHF, Obstructive sleep apnea, hypertension, diabetes who presented to the hospital as he was found unresponsive and noted to be in pulseless cardiac arrest.  1.  Altered mental status/encephalopathy-suspected to be secondary to underlying anoxic brain injury. - Neurology was consulted, they have explained to the patient's wife about patient's poor prognosis and unlikely that he would recover from this.   -As per neurology patient does not meet fulfill criteria for brain death.  Family and wife are hopeful.  2. Acute Respiratory failure-patient was intubated after his cardiac arrest.  Not showing any signs of meaningful neurological recovery.  -Patient has increasing secretions and thought to have pneumonia suspected aspiration.  Status post bronchoscopy on 02/03/18  but as per intensivist not much secretions were removed.   -Continue IV meropenem, further pulmonary toileting as per intensivist. -ENT is recommending to transfer the patient to tertiary care center as they cannot do tracheostomy in view of goiter  3.  Acute kidney injury with oliguria-secondary to underlying  cardiac arrest and  cardiorenal hemodynamics. -  Patient still requiring intermittent HD.  No plans for hemodialysis today.  Last hemodialysis on August 3 -As per nephrology plan for PermCath placement this week as patient is not dialysis dependent.   4.  Pulseless cardiac arrest-as a result of worsening respiratory failure and flash pulmonary edema. - Patient was resuscitated in the ER for about 17 minutes prior to regaining spontaneous circulation. - No further arrhythmias but prognosis is quite poor 5.  Sepsis/septic shock- thought to have possible aspiration pneumonia.   Continue empiric Meropenem. - pt. Off vasopressors now.  Cultures remain (-).     6.  History of ischemic cardiomyopathy with ejection fraction of 20 to 25%- patient does not appear to be volume overloaded presently.   7.  Leukocytosis-secondary to suspected sepsis.  Follow with IV antibiotic therapy and it's improving.  8.  Failure to thrive-palliative care consult placed  Patient has a very poor prognosis given his multiple comorbidities and now with multiorgan failure and likely anoxic brain injury.  Family and wife want to cont. Aggressive care.     All the records are reviewed and case discussed with Care Management/Social Worker. Management plans discussed with the patient, family and they are in agreement.  CODE STATUS: Full code  DVT Prophylaxis: Hep SQ  TOTAL TIME TAKING CARE OF THIS PATIENT: 28 minutes.   POSSIBLE D/C IN unclear, DEPENDING ON CLINICAL CONDITION and course   Nicholes Mango M.D on 02/07/2018 at 2:47 PM  Between 7am to 6pm - Pager - [phone]  After 6pm go to [email or web address] - password [email or web address] [facility]  Office  [phone]  CC: Primary care physician; Marguerita Merles, MD

## 2018-02-07 NOTE — Progress Notes (Signed)
Patient has been biting ET tube and desating along with being diapharetic therefore fentanyl drip increased.

## 2018-02-07 NOTE — Progress Notes (Signed)
   CHIEF COMPLAINT:   Chief Complaint  Patient presents with  . Cardiac Arrest    Subjective  Remains intubated Severe brain damage Critically ill On full vent support Myoclonic jerks Prognosis is very poor Palliative care team consulted  Need to update family    REVIEW OF SYSTEMS  PATIENT IS UNABLE TO PROVIDE COMPLETE REVIEW OF SYSTEM S DUE TO SEVERE CRITICAL ILLNESS AND ENCEPHALOPATHY   Objective   PHYSICAL EXAMINATION:  GENERAL:critically ill appearing, +resp distress HEAD: Normocephalic, atraumatic.  EYES: Pupils equal, round, reactive to light.  No scleral icterus.  MOUTH: Moist mucosal membrane. NECK: Supple. No thyromegaly. No nodules. No JVD.  PULMONARY: +rhonchi, +wheezing CARDIOVASCULAR: S1 and S2. Regular rate and rhythm. No murmurs, rubs, or gallops.  GASTROINTESTINAL: Soft, nontender, -distended. No masses. Positive bowel sounds. No hepatosplenomegaly.  MUSCULOSKELETAL: No swelling, clubbing, or edema.  NEUROLOGIC: obtunded SKIN:intact,warm,dry   VITALS:  height is 6\' 1"  (1.854 m) and weight is 271 lb 2.7 oz (123 kg). His bladder temperature is 98.2 F (36.8 C). His blood pressure is 130/77 and his pulse is 86. His respiration is 24 (abnormal) and oxygen saturation is 98%.   I personally reviewed Labs under Results section.  Radiology Reports Dg Chest Port 1 View  Result Date: 02/05/2018 CLINICAL DATA:  Mechanically assisted ventilation. EXAM: PORTABLE CHEST 1 VIEW COMPARISON:  02/03/2018 FINDINGS: Endotracheal tube terminates 5.5 cm above the carina. Right jugular catheter terminates over the mid SVC. Left jugular catheter terminates over the upper SVC. The cardiac silhouette remains enlarged with similar appearance of pulmonary vascular congestion. Right greater than left lower lung hazy opacities are also similar to the prior study and likely represent small pleural effusions and atelectasis. No pneumothorax is identified. IMPRESSION: Unchanged  pulmonary vascular congestion, pleural effusions, and bibasilar atelectasis. Electronically Signed   By: Logan Bores M.D.   On: 02/05/2018 11:30       Assessment/Plan:  57 year old male with past medical history of ischemic cardiomyopathy ejection fraction of 20 to 93%, chronic systolic CHF, Obstructive sleep apnea, hypertension, diabetes who presented to the hospital as he was found unresponsive and noted to be in pulseless cardiac arrest     1.Severe Hypoxic and Hypercapnic Respiratory Failure -continue Full MV support -continue Bronchodilator Therapy -Wean Fio2 and PEEP as tolerated Respiratory failure-patient was intubated after his cardiac arrest. Not showing any signs of meaningful neurological recovery. if Family wants to continue aggressive care.  -plan for trach this week     2.Status post cardiac arrest.  Status post targeted temperature management. No significant improvement in neurologic status, anoxic brain injury Appreciate neurology.  Neurology to update family    3.CHF History of severe cardiomyopathy with systolic failure. Measured ejection fraction of 15%   4.Renal Failure-most likely due to ATN -follow chem 7 -follow UO -continue Foley Catheter-assess need HD as needed    Critical Care Time devoted to patient care services described in this note is 42 minutes.   Overall, patient is critically ill, prognosis is guarded.  Patient with Multiorgan failure and at high risk for cardiac arrest and death.    Prognosis is very poor Recommend DNR status and comfort care measures-family may be unreasonable and wants everything done.    Corrin Parker, M.D.  [facility]  Medical Director [place] Director [facility] Cardio-Pulmonary Department

## 2018-02-08 LAB — GLUCOSE, CAPILLARY
GLUCOSE-CAPILLARY: 109 mg/dL — AB (ref 70–99)
GLUCOSE-CAPILLARY: 112 mg/dL — AB (ref 70–99)
GLUCOSE-CAPILLARY: 127 mg/dL — AB (ref 70–99)
GLUCOSE-CAPILLARY: 127 mg/dL — AB (ref 70–99)
GLUCOSE-CAPILLARY: 137 mg/dL — AB (ref 70–99)
GLUCOSE-CAPILLARY: 145 mg/dL — AB (ref 70–99)
Glucose-Capillary: 128 mg/dL — ABNORMAL HIGH (ref 70–99)

## 2018-02-08 LAB — CBC
HCT: 24.9 % — ABNORMAL LOW (ref 40.0–52.0)
HEMATOCRIT: 25.2 % — AB (ref 40.0–52.0)
HEMOGLOBIN: 8.4 g/dL — AB (ref 13.0–18.0)
Hemoglobin: 8.3 g/dL — ABNORMAL LOW (ref 13.0–18.0)
MCH: 30.9 pg (ref 26.0–34.0)
MCH: 30.8 pg (ref 26.0–34.0)
MCHC: 33.5 g/dL (ref 32.0–36.0)
MCHC: 33.3 g/dL (ref 32.0–36.0)
MCV: 92.2 fL (ref 80.0–100.0)
MCV: 92.6 fL (ref 80.0–100.0)
PLATELETS: 317 10*3/uL (ref 150–440)
Platelets: 356 10*3/uL (ref 150–440)
RBC: 2.7 MIL/uL — AB (ref 4.40–5.90)
RBC: 2.73 MIL/uL — AB (ref 4.40–5.90)
RDW: 13.6 % (ref 11.5–14.5)
RDW: 13.5 % (ref 11.5–14.5)
WBC: 26.4 10*3/uL — ABNORMAL HIGH (ref 3.8–10.6)
WBC: 28.3 10*3/uL — AB (ref 3.8–10.6)

## 2018-02-08 LAB — COMPREHENSIVE METABOLIC PANEL
ALK PHOS: 49 U/L (ref 38–126)
ALT: 33 U/L (ref 0–44)
AST: 87 U/L — ABNORMAL HIGH (ref 15–41)
Albumin: 1.9 g/dL — ABNORMAL LOW (ref 3.5–5.0)
Anion gap: 16 — ABNORMAL HIGH (ref 5–15)
BUN: 125 mg/dL — ABNORMAL HIGH (ref 6–20)
CALCIUM: 7.5 mg/dL — AB (ref 8.9–10.3)
CO2: 23 mmol/L (ref 22–32)
CREATININE: 6.17 mg/dL — AB (ref 0.61–1.24)
Chloride: 101 mmol/L (ref 98–111)
GFR, EST AFRICAN AMERICAN: 10 mL/min — AB (ref >60)
GFR, EST NON AFRICAN AMERICAN: 9 mL/min — AB (ref >60)
Glucose, Bld: 136 mg/dL — ABNORMAL HIGH (ref 70–99)
Potassium: 5.6 mmol/L — ABNORMAL HIGH (ref 3.5–5.1)
SODIUM: 140 mmol/L (ref 135–145)
Total Bilirubin: 1 mg/dL (ref 0.3–1.2)
Total Protein: 6.4 g/dL — ABNORMAL LOW (ref 6.5–8.1)

## 2018-02-08 LAB — PHOSPHORUS: PHOSPHORUS: 11.5 mg/dL — AB (ref 2.5–4.6)

## 2018-02-08 MED ORDER — ALTEPLASE 2 MG IJ SOLR: 2.0000 mg | Freq: Once | INTRAMUSCULAR | Status: DC | PRN

## 2018-02-08 MED ORDER — INSULIN ASPART 100 UNIT/ML IV SOLN
10.0000 [IU] | Freq: Once | INTRAVENOUS | Status: DC
  Filled 2018-02-08: qty 0.1

## 2018-02-08 MED ORDER — LIDOCAINE-PRILOCAINE 2.5-2.5 % EX CREA
1.0000 "application " | TOPICAL_CREAM | CUTANEOUS | Status: DC | PRN
  Filled 2018-02-08: qty 5

## 2018-02-08 MED ORDER — SODIUM CHLORIDE 0.9 % IV SOLN: 100.0000 mL | INTRAVENOUS | Status: DC | PRN

## 2018-02-08 MED ORDER — DEXTROSE 50 % IV SOLN: 1.0000 | Freq: Once | INTRAVENOUS | Status: DC

## 2018-02-08 MED ORDER — CHLORHEXIDINE GLUCONATE CLOTH 2 % EX PADS
6.0000 | MEDICATED_PAD | Freq: Every day | CUTANEOUS | Status: DC
  Administered 2018-02-09: 6 via TOPICAL

## 2018-02-08 MED ORDER — PENTAFLUOROPROP-TETRAFLUOROETH EX AERO
1.0000 "application " | INHALATION_SPRAY | CUTANEOUS | Status: DC | PRN
  Filled 2018-02-08: qty 30

## 2018-02-08 MED ORDER — HEPARIN SODIUM (PORCINE) 1000 UNIT/ML DIALYSIS: 1000.0000 [IU] | INTRAMUSCULAR | Status: DC | PRN

## 2018-02-08 MED ORDER — LIDOCAINE HCL (PF) 1 % IJ SOLN
5.0000 mL | INTRAMUSCULAR | Status: DC | PRN
  Filled 2018-02-08: qty 5

## 2018-02-08 NOTE — Progress Notes (Signed)
HD tx start    02/08/18 1648  Vital Signs  Temp (!) 100.6 F (38.1 C)  Temp Source Bladder  Pulse Rate (!) 114  Pulse Rate Source Monitor  Resp (!) 26  BP (!) 152/83  BP Location Left Arm  BP Method Automatic  Patient Position (if appropriate) Lying  Oxygen Therapy  SpO2 96 %  O2 Device Ventilator  End Tidal CO2 (EtCO2) 41  During Hemodialysis Assessment  Blood Flow Rate (mL/min) 400 mL/min  Arterial Pressure (mmHg) -140 mmHg  Venous Pressure (mmHg) 170 mmHg  Transmembrane Pressure (mmHg) 60 mmHg  Ultrafiltration Rate (mL/min) 170 mL/min  Dialysate Flow Rate (mL/min) 600 ml/min  Conductivity: Machine  14  HD Safety Checks Performed Yes  Dialysis Fluid Bolus Normal Saline  Bolus Amount (mL) 250 mL  Intra-Hemodialysis Comments Tx initiated  Hemodialysis Catheter Right Internal jugular Double-lumen;Temporary  Placement Date/Time: 01/31/18 0025   Placed prior to admission: No  Time Out: Correct patient;Correct site;Correct procedure  Maximum sterile barrier precautions: Sterile gown;Cap;Mask;Large sterile sheet;Hand hygiene;Sterile gloves  Site Prep: Skin P...  Blue Lumen Status Infusing  Red Lumen Status Infusing

## 2018-02-08 NOTE — Progress Notes (Signed)
[place] [facility] [place] NAME: [name]    MR#:  [id number]  DATE OF BIRTH:  04/12/1961  SUBJECTIVE:   Febrile, for hemodialysis today, remains on a low-dose propofol drip.  status post IR guided gastrostomy tube placement and now on tube feeds and tolerating it well.  Remains intubated   REVIEW OF SYSTEMS:    Review of Systems  Unable to perform ROS: Intubated    Nutrition: Tube feeds Tolerating Diet: Yes  DRUG ALLERGIES:   Allergies  Allergen Reactions  . Enalapril Maleate Anaphylaxis  . Iodinated Diagnostic Agents Itching  . Isosorbide Mononitrate Er [Isosorbide Dinitrate] Other (See Comments)    "made me not be able to think or function"  . Canagliflozin Other (See Comments)    VITALS:  Blood pressure (!) 155/86, pulse (!) 114, temperature (!) 100.6 F (38.1 C), resp. rate (!) 26, height 6\' 1"  (1.854 m), weight 117.2 kg (258 lb 6.1 oz), SpO2 95 %.  PHYSICAL EXAMINATION:   Physical Exam  GENERAL:  57 y.o.-year-old patient lying in bed intubated & Sedated.  EYES: PERRL but minimally responsive.  HEENT: Head atraumatic, normocephalic. ET and OG tubes in place.   NECK:  Supple, no jugular venous distention. No thyroid enlargement, no tenderness.  LUNGS: Normal breath sounds bilaterally, no wheezing, rales, rhonchi. No use of accessory muscles of respiration.  CARDIOVASCULAR: S1, S2 normal. No murmurs, rubs, or gallops.  ABDOMEN: Soft, nontender, nondistended. Bowel sounds present. No organomegaly or mass.  + G-tube in place.  EXTREMITIES: No cyanosis, clubbing or edema b/l.    NEUROLOGIC: Sedated & Intubated.  Having some myoclonic jerks occasionally, positive clonus at times. PSYCHIATRIC: Sedated & Intubated.   SKIN: No obvious rash, lesion, or ulcer.    LABORATORY PANEL:   CBC Recent Labs  Lab 02/08/18 0845  WBC 26.4*  HGB 8.3*  HCT 24.9*  PLT 317    ------------------------------------------------------------------------------------------------------------------  Chemistries  Recent Labs  Lab 02/06/18 0503  02/08/18 0845  NA 138   < > 140  K 4.4   < > 5.6*  CL 98   < > 101  CO2 27   < > 23  GLUCOSE 113*   < > 136*  BUN 84*   < > 125*  CREATININE 4.76*   < > 6.17*  CALCIUM 7.7*   < > 7.5*  MG 2.5*  --   --   AST  --   --  87*  ALT  --   --  33  ALKPHOS  --   --  49  BILITOT  --   --  1.0   < > = values in this interval not displayed.   ------------------------------------------------------------------------------------------------------------------  Cardiac Enzymes No results for input(s): TROPONINI in the last 168 hours. ------------------------------------------------------------------------------------------------------------------  RADIOLOGY:  No results found.   ASSESSMENT AND PLAN:   57 year old male with past medical history of ischemic cardiomyopathy ejection fraction of 20 to 16%, chronic systolic CHF, Obstructive sleep apnea, hypertension, diabetes who presented to the hospital as he was found unresponsive and noted to be in pulseless cardiac arrest.  1.  Altered mental status/encephalopathy-suspected to be secondary to underlying anoxic brain injury. - Neurology was consulted, they have explained to the patient's wife about patient's poor prognosis and unlikely that he would recover from this.   -As per neurology patient does not meet fulfill criteria for brain death.  Family and wife are hopeful.  2. Acute Respiratory failure-patient was intubated  after his cardiac arrest.  Not showing any signs of meaningful neurological recovery.  -Patient has increasing secretions and thought to have pneumonia suspected aspiration.  Status post bronchoscopy on 02/03/18  but as per intensivist not much secretions were removed.   -Continue IV meropenem, further pulmonary toileting as per intensivist. -ENT is recommending  to transfer the patient to tertiary care center as they cannot do tracheostomy in view of goiter  3.  Acute kidney injury with oliguria-secondary to underlying cardiac arrest and cardiorenal hemodynamics. -  Patient still requiring intermittent HD.  No plans for hemodialysis today.  Last hemodialysis on August 3 -As per nephrology plan for PermCath placement this week as patient is not dialysis dependent.   4.  Pulseless cardiac arrest-as a result of worsening respiratory failure and flash pulmonary edema. - Patient was resuscitated in the ER for about 17 minutes prior to regaining spontaneous circulation. - No further arrhythmias but prognosis is quite poor 5.  Sepsis/septic shock- thought to have possible aspiration pneumonia.   Continue empiric Meropenem. - pt. Off vasopressors now.  Cultures remain (-).     6.  History of ischemic cardiomyopathy with ejection fraction of 20 to 25%- patient does not appear to be volume overloaded presently.   7.  Leukocytosis-secondary to suspected sepsis.  Follow with IV antibiotic therapy and it's improving.  8.  Failure to thrive-palliative care with the family members and patient's CODE STATUS has been changed to DO NOT RESUSCITATE.  Plan for comfort care on Friday   Patient has a very poor prognosis given his multiple comorbidities and now with multiorgan failure and likely anoxic brain injury.  Family and wife want to cont. Aggressive care.     All the records are reviewed and case discussed with Care Management/Social Worker. Management plans discussed with the patient, family and they are in agreement.  CODE STATUS: Full code  DVT Prophylaxis: Hep SQ  TOTAL TIME TAKING CARE OF THIS PATIENT: 28 minutes.   POSSIBLE D/C IN unclear, DEPENDING ON CLINICAL CONDITION and course   Nicholes Mango M.D on 02/08/2018 at 1:41 PM  Between 7am to 6pm - Pager - [phone]  After 6pm go to [email or web address] - password [email or web address]  [facility]  Office  [phone]  CC: Primary care physician; Marguerita Merles, MD

## 2018-02-08 NOTE — Progress Notes (Signed)
HD tx end    02/08/18 2000  Vital Signs  Temp 98.6 F (37 C)  Temp Source Bladder  Pulse Rate (!) 105  Pulse Rate Source Monitor  Resp (!) 27  BP (!) 160/88  BP Location Left Arm  BP Method Automatic  Patient Position (if appropriate) Lying  Oxygen Therapy  SpO2 98 %  O2 Device Ventilator  End Tidal CO2 (EtCO2) 39  During Hemodialysis Assessment  Dialysis Fluid Bolus Normal Saline  Bolus Amount (mL) 250 mL  Intra-Hemodialysis Comments Tx completed

## 2018-02-08 NOTE — Progress Notes (Signed)
Pre HD assessment    02/08/18 1640  Neurological  Level of Consciousness Unresponsive  Orientation Level Intubated/Tracheostomy - Unable to assess  Respiratory  Respiratory Pattern Regular;Unlabored  Chest Assessment Chest expansion symmetrical  Cardiac  ECG Monitor Yes  Cardiac Rhythm ST  Vascular  R Radial Pulse +2  L Radial Pulse +2  Edema Generalized;Right lower extremity;Left lower extremity  Integumentary  Integumentary (WDL) X  Skin Color Appropriate for ethnicity  Musculoskeletal  Musculoskeletal (WDL) X  Generalized Weakness Yes  Assistive Device None  GU Assessment  Genitourinary (WDL) X  Genitourinary Symptoms  (HD)  Psychosocial  Psychosocial (WDL) X  Patient Behaviors Not interactive

## 2018-02-08 NOTE — Progress Notes (Signed)
   CHIEF COMPLAINT:   Chief Complaint  Patient presents with  . Cardiac Arrest    Subjective  Remains intubated Severe brain damage Critically ill On full vent support Myoclonic jerks Prognosis is very poor Palliative care team consulted Patient is suffering Was made DNR by wife yesterday Plan for comfort care on Friday     REVIEW OF SYSTEMS  PATIENT IS UNABLE TO PROVIDE COMPLETE REVIEW OF SYSTEM S DUE TO SEVERE CRITICAL ILLNESS AND ENCEPHALOPATHY   Objective   PHYSICAL EXAMINATION:  GENERAL:critically ill appearing, +resp distress HEAD: Normocephalic, atraumatic.  EYES: Pupils equal, round, reactive to light.  No scleral icterus.  MOUTH: Moist mucosal membrane. NECK: Supple. No thyromegaly. No nodules. No JVD.  PULMONARY: +rhonchi, +wheezing CARDIOVASCULAR: S1 and S2. Regular rate and rhythm. No murmurs, rubs, or gallops.  GASTROINTESTINAL: Soft, nontender, -distended. No masses. Positive bowel sounds. No hepatosplenomegaly.  MUSCULOSKELETAL: No swelling, clubbing, or edema.  NEUROLOGIC: obtunded SKIN:intact,warm,dry   VITALS:  height is 6\' 1"  (1.854 m) and weight is 258 lb 6.1 oz (117.2 kg). His temperature is 100.4 F (38 C) (abnormal). His blood pressure is 152/80 (abnormal) and his pulse is 110 (abnormal). His respiration is 23 (abnormal) and oxygen saturation is 96%.   I personally reviewed Labs under Results section.  Radiology Reports No results found.     Assessment/Plan:  57 year old male with past medical history of ischemic cardiomyopathy ejection fraction of 20 to 65%, chronic systolic CHF, Obstructive sleep apnea, hypertension, diabetes who presented to the hospital as he was found unresponsive and noted to be in pulseless cardiac arrest    1.Severe Hypoxic and Hypercapnic Respiratory Failure -continue Full MV support -continue Bronchodilator Therapy -Wean Fio2 and PEEP as tolerated Respiratory failure-patient was intubated after his  cardiac arrest. Not showing any signs of meaningful neurological recovery.  2.Status post cardiac arrest.  Status post targeted temperature management. No significant improvement in neurologic status, anoxic brain injury Appreciate neurology.   3.CHF History of severe cardiomyopathy with systolic failure. Measured ejection fraction of 15%   4.Renal Failure-most likely due to ATN -follow chem 7 -follow UO -continue Foley Catheter-assess need HD as needed    Critical Care Time devoted to patient care services described in this note is 35 minutes.   Overall, patient is critically ill, prognosis is guarded.  Patient with Multiorgan failure and at high risk for cardiac arrest and death.    Prognosis is very poor Patient now DNR status Plan for comfort care on Friday    Panayiota Larkin Patricia Pesa, M.D.  [facility]  Medical Director [place] Director [facility] Cardio-Pulmonary Department

## 2018-02-08 NOTE — Progress Notes (Signed)
Post HD assessment. Pt tolerated tx well without c/o or complication. Net UF 16, goal met.    02/08/18 2015  Vital Signs  Temp 98.8 F (37.1 C)  Temp Source Bladder  Pulse Rate (!) 103  Pulse Rate Source Monitor  Resp (!) 23  BP (!) 157/83  BP Location Left Arm  BP Method Automatic  Patient Position (if appropriate) Lying  Oxygen Therapy  SpO2 100 %  O2 Device Ventilator  End Tidal CO2 (EtCO2) 40  Dialysis Weight  Weight 122 kg (268 lb 15.4 oz)  Type of Weight Post-Dialysis  Post-Hemodialysis Assessment  Rinseback Volume (mL) 250 mL  KECN 61.3 V  Dialyzer Clearance Lightly streaked  Duration of HD Treatment -hour(s) 3 hour(s)  Hemodialysis Intake (mL) 500 mL  UF Total -Machine (mL) 516 mL  Net UF (mL) 16 mL  Tolerated HD Treatment Yes  Hemodialysis Catheter Right Internal jugular Double-lumen;Temporary  Placement Date/Time: 01/31/18 0025   Placed prior to admission: No  Time Out: Correct patient;Correct site;Correct procedure  Maximum sterile barrier precautions: Sterile gown;Cap;Mask;Large sterile sheet;Hand hygiene;Sterile gloves  Site Prep: Skin P...  Site Condition No complications  Blue Lumen Status Heparin locked  Red Lumen Status Heparin locked  Purple Lumen Status N/A  Catheter fill solution Heparin 1000 units/ml  Catheter fill volume (Arterial) 1.3 cc  Catheter fill volume (Venous) 1.3  Dressing Type Biopatch  Dressing Status Clean;Dry;Intact  Drainage Description None  Dressing Change Due 02/11/18  Post treatment catheter status Capped and Clamped

## 2018-02-08 NOTE — Progress Notes (Signed)
[facility]  ROUNDING NOTE   Subjective:   UOP 1875  Tmax 100.4  K 5.6 (4.8) Creatinine 6.17 (5.53) (4.76) BUN 125 (102) (84)  Objective:  Vital signs in last 24 hours:  Temp:  [99 F (37.2 C)-100.6 F (38.1 C)] 100.6 F (38.1 C) (08/07 1000) Pulse Rate:  [97-115] 114 (08/07 1000) Resp:  [22-29] 26 (08/07 1000) BP: (135-160)/(74-90) 155/86 (08/07 1000) SpO2:  [84 %-100 %] 94 % (08/07 1000) FiO2 (%):  [28 %-50 %] 35 % (08/07 0800) Weight:  [117.2 kg (258 lb 6.1 oz)] 117.2 kg (258 lb 6.1 oz) (08/07 0427)  Weight change: -5.8 kg (-12 lb 12.6 oz) Filed Weights   02/06/18 0500 02/07/18 0500 02/08/18 0427  Weight: 117.2 kg (258 lb 6.1 oz) 123 kg (271 lb 2.7 oz) 117.2 kg (258 lb 6.1 oz)    Intake/Output: I/O last 3 completed shifts: In: 3645.3 [I.V.:1254.3; Other:30; NG/GT:1440; IV Piggyback:921] Out: 2665 [Urine:2665]   Intake/Output this shift:  Total I/O In: 50 [I.V.:50] Out: -   Physical Exam: General: Critically ill  Head: ETT  Eyes: Anicteric   Neck: RIJ LIJ lines  Lungs:  Bilateral crackles, PRVC FiO2 35%  Heart: Regular rate and rhythm  Abdomen:  Soft, nontender, +PEG  Extremities: ++ peripheral edema.  Neurologic: Intubated, on sedation  Skin: No lesions  Access: RIJ temp HD catheter 7/29 Tukov    Basic Metabolic Panel: Recent Labs  Lab 02/14/2018 0012 02/26/2018 0432  02/03/18 0509 02/04/18 1633 02/05/18 1139 02/05/18 1643 02/06/18 0503 02/07/18 0506 02/08/18 0845  NA 136 137  --  140  --  138  --  138 138 140  K 5.0 4.9  --  4.5  --  4.2  --  4.4 4.8 5.6*  CL 103 104  --  105  --  100  --  98 100 101  CO2 27 26  --  29  --  28  --  27 25 23   GLUCOSE 140* 105*  --  93  --  173*  --  113* 118* 136*  BUN 39* 39*  --  51*  --  68*  --  84* 102* 125*  CREATININE 2.39* 2.56*  --  3.43*  --  3.99*  --  4.76* 5.53* 6.17*  CALCIUM 8.2* 8.4*  --  8.0*  --  7.6*  --  7.7* 7.7* 7.5*  MG 2.3 2.2  --   --   --  2.4 2.4 2.5*  --   --   PHOS  4.4 4.1   < >  --  6.8* 6.4* 6.9* 7.8*  7.8* 10.5*  --    < > = values in this interval not displayed.    Liver Function Tests: Recent Labs  Lab 02/10/2018 0012 02/05/2018 0432 02/06/18 0503 02/07/18 0506 02/08/18 0845  AST  --   --   --   --  87*  ALT  --   --   --   --  33  ALKPHOS  --   --   --   --  49  BILITOT  --   --   --   --  1.0  PROT  --   --   --   --  6.4*  ALBUMIN 2.0* 2.1* 1.7* 1.8* 1.9*   No results for input(s): LIPASE, AMYLASE in the last 168 hours. No results for input(s): AMMONIA in the last 168 hours.  CBC: Recent Labs  Lab 02/17/2018 0432 02/05/18  1139 02/06/18 0929 02/07/18 0506 02/08/18 0845  WBC 19.4* 22.4* 23.2* 22.2* 26.4*  NEUTROABS 14.9* 18.3*  --   --   --   HGB 8.9* 8.2* 8.1* 8.2* 8.3*  HCT 26.4* 24.5* 23.6* 23.7* 24.9*  MCV 91.9 92.8 92.5 92.0 92.2  PLT 111* 172 203 245 317    Cardiac Enzymes: No results for input(s): CKTOTAL, CKMB, CKMBINDEX, TROPONINI in the last 168 hours.  BNP: Invalid input(s): POCBNP  CBG: Recent Labs  Lab 02/07/18 1643 02/07/18 2012 02/08/18 0021 02/08/18 0408 02/08/18 0725  GLUCAP 141* 112* 109* 112* 128*    Microbiology: Results for orders placed or performed during the hospital encounter of 01/14/2018  MRSA PCR Screening     Status: None   Collection Time: 01/28/18  5:00 AM  Result Value Ref Range Status   MRSA by PCR NEGATIVE NEGATIVE Final    Comment:        The GeneXpert MRSA Assay (FDA approved for NASAL specimens only), is one component of a comprehensive MRSA colonization surveillance program. It is not intended to diagnose MRSA infection nor to guide or monitor treatment for MRSA infections. Performed at [facility], [address], [place], [place] [address]   Urine Culture     Status: None   Collection Time: 01/28/18  6:16 AM  Result Value Ref Range Status   Specimen Description   Final    URINE, RANDOM Performed at [facility], [address]  [place], [place] [address]    Special Requests   Final    NONE Performed at [facility], [address], [place], [place] [address]    Culture   Final    NO GROWTH Performed at [facility], [place] [address], [place], [place] [address]    Report Status 01/29/2018 FINAL  Final  Culture, blood (Routine X 2) w Reflex to ID Panel     Status: None   Collection Time: 01/28/18  6:45 AM  Result Value Ref Range Status   Specimen Description BLOOD LAC  Final   Special Requests   Final    BOTTLES DRAWN AEROBIC AND ANAEROBIC Blood Culture adequate volume   Culture   Final    NO GROWTH 5 DAYS Performed at [facility], [address] [place], [place] [address]    Report Status 02/13/2018 FINAL  Final  Culture, blood (Routine X 2) w Reflex to ID Panel     Status: None   Collection Time: 01/28/18  6:56 AM  Result Value Ref Range Status   Specimen Description BLOOD RAC  Final   Special Requests   Final    BOTTLES DRAWN AEROBIC AND ANAEROBIC Blood Culture results may not be optimal due to an inadequate volume of blood received in culture bottles   Culture   Final    NO GROWTH 5 DAYS Performed at [facility], [address], [place], [place] [address]    Report Status 02/15/2018 FINAL  Final  Culture, respiratory (non-expectorated)     Status: None   Collection Time: 01/28/18 11:24 AM  Result Value Ref Range Status   Specimen Description   Final    TRACHEAL ASPIRATE Performed at [facility], [address], [place], [place] [address]    Special Requests   Final    NONE Performed at [facility], [address] [place], [place] [address]    Gram Stain   Final    RARE WBC PRESENT,BOTH PMN  AND MONONUCLEAR RARE GRAM POSITIVE COCCI IN PAIRS    Culture   Final    RARE Consistent with normal respiratory flora. Performed at [facility], [place] [address], [place], [place] [address]    Report Status 01/30/2018 FINAL  Final  Culture,  respiratory (non-expectorated)     Status: None (Preliminary result)   Collection Time: 02/03/18  7:53 AM  Result Value Ref Range Status   Specimen Description   Final    TRACHEAL ASPIRATE Performed at [facility], [address], [place], [place] [address]    Special Requests   Final    NONE Performed at [facility], [address] [place], [place] [address]    Gram Stain   Final    ABUNDANT WBC PRESENT, PREDOMINANTLY PMN FEW GRAM NEGATIVE RODS    Culture   Final    FEW BURKHOLDERIA SPECIES Sent to [facility] for further susceptibility testing. Performed at [facility], [place] [address], [place], [place] [address]    Report Status PENDING  Incomplete    Coagulation Studies: No results for input(s): LABPROT, INR in the last 72 hours.  Urinalysis: No results for input(s): COLORURINE, LABSPEC, PHURINE, GLUCOSEU, HGBUR, BILIRUBINUR, KETONESUR, PROTEINUR, UROBILINOGEN, NITRITE, LEUKOCYTESUR in the last 72 hours.  Invalid input(s): APPERANCEUR    Imaging: No results found.   Medications:   . sodium chloride Stopped (02/14/2018 1838)  . sodium chloride 10 mL/hr at 02/08/18 0800  . fentaNYL infusion INTRAVENOUS 200 mcg/hr (02/08/18 0800)  . meropenem (MERREM) IV Stopped (02/08/18 1012)  . midazolam (VERSED) infusion 1 mg/hr (02/07/18 1516)   . B-complex with vitamin C  1 tablet Per Tube QHS  . chlorhexidine gluconate (MEDLINE KIT)  15 mL Mouth Rinse BID  . famotidine  20 mg Per Tube QHS  . feeding supplement (PRO-STAT SUGAR FREE 64)  30 mL Per Tube BID  . feeding supplement (VITAL HIGH PROTEIN)  1,000 mL Per Tube Q24H  . heparin injection (subcutaneous)  5,000 Units Subcutaneous Q8H  . insulin aspart  0-20 Units Subcutaneous Q4H  . insulin glargine  5 Units Subcutaneous QHS  . ipratropium-albuterol  3 mL Nebulization Q6H  . mouth rinse  15 mL Mouth Rinse 10 times per day  . metoprolol tartrate  5 mg Intravenous Q6H  . multivitamin  15 mL Per Tube  Daily  . sodium chloride flush  10-40 mL Intracatheter Q12H   sodium chloride, acetaminophen **OR** acetaminophen, fentaNYL, heparin, hydrALAZINE, midazolam, sodium chloride flush  Assessment/ Plan:  [name] is a 57 y.o. black male with diabetes mellitus type 2, hypertension, obstructive sleep apnea, who was admitted to [facility] on7/26/2019for evaluation of cardiac arrest that occurred at home.   1. Acute renal failure, oliguric. Baseline creatinine 0.68 12/13/17 2. Metabolic acidosis   3. Hyperkalemia 4. Acute respiratory failure requiring mechanical ventilation  5. Cardiac arrest, completed hypothermia protocol. 6. Anemia with renal failure. 7. Hyperphosphatemia  Plan: Hemodialysis treatment today for hyperkalemia and azotemia. Limited ultrafiltration since patient is nonoliguric with chance of recovery.  Hemodialysis orders prepared. Last hemodialysis treatment was 8/3.  If prognosis improves, will proceed with permcath placement.    LOS: [place] 8/7/201910:41 AM

## 2018-02-08 NOTE — Progress Notes (Signed)
Pre HD assessment   02/08/18 1634  Vital Signs  Temp (!) 100.6 F (38.1 C)  Temp Source Bladder  Pulse Rate (!) 117  Pulse Rate Source Monitor  Resp 18  BP (!) 148/82  BP Location Left Arm  BP Method Automatic  Patient Position (if appropriate) Lying  Oxygen Therapy  SpO2 98 %  O2 Device Ventilator  End [place] CO2 (EtCO2) 42  Pain Assessment  Pain Scale CPOT  Critical Care Pain Observation Tool (CPOT)  Facial Expression 0  Body Movements 0  Muscle Tension 0  Compliance with ventilator (intubated pts.) 0  Vocalization (extubated pts.) N/A  CPOT Total 0  Dialysis Weight  Weight 122.2 kg (269 lb 6.4 oz)  Type of Weight Pre-Dialysis  Time-Out for Hemodialysis  What Procedure? HD  Pt Identifiers(min of two) First/Last Name;MRN/Account#;Pt's DOB(use if MRN/Acct# not available  Correct Site? Yes  Correct Side? Yes  Correct Procedure? Yes  Consents Verified? Yes  Safety Precautions Reviewed? Yes  Engineer, civil (consulting) Number  (7A)  Station Number  (ICU/ bedside )  UF/Alarm Test Passed  Conductivity: Meter 14  Conductivity: Machine  14.1  pH 7.4  Reverse Osmosis RO [id number]  Normal Saline Lot Number [id number]  Dialyzer Lot Number [id number]  Disposable Set Lot Number [id number]  Machine Temperature 97.9 F (36.6 C)  Musician and Audible Yes  Blood Lines Intact and Secured Yes  Pre Treatment Patient Checks  Vascular access used during treatment Catheter  Hepatitis B Surface Antigen Results Negative  Date Hepatitis B Surface Antigen Drawn 02/28/2018  Isolation Initiated Yes  Hepatitis B Surface Antibody  (unk)  Date Hepatitis B Surface Antibody Drawn 02/08/18  Hemodialysis Consent Verified Yes  Hemodialysis Standing Orders Initiated Yes  ECG (Telemetry) Monitor On Yes  Prime Ordered Normal Saline  Length of  DialysisTreatment -hour(s) 3 Hour(s)  Dialysis Treatment Comments Na 140  Dialyzer Elisio 17H NR  Dialysate 2K, 2.5 Ca  Dialysis Anticoagulant None   Dialysate Flow Ordered 600  Blood Flow Rate Ordered 400 mL/min  Ultrafiltration Goal 0 Liters  Pre Treatment Labs CBC (HBSAB)  Dialysis Blood Pressure Support Ordered Normal Saline  Education / Care Plan  Dialysis Education Provided Yes  Documented Education in Care Plan Yes  Hemodialysis Catheter Right Internal jugular Double-lumen;Temporary  Placement Date/Time: 01/31/18 0025   Placed prior to admission: No  Time Out: Correct patient;Correct site;Correct procedure  Maximum sterile barrier precautions: Sterile gown;Cap;Mask;Large sterile sheet;Hand hygiene;Sterile gloves  Site Prep: Skin P...  Site Condition No complications  Blue Lumen Status Heparin locked  Red Lumen Status Heparin locked  Purple Lumen Status N/A  Dressing Type Biopatch  Dressing Status Clean;Dry;Intact  Drainage Description None

## 2018-02-08 NOTE — Progress Notes (Signed)
Post HD assessment    02/08/18 2014  Neurological  Level of Consciousness Unresponsive  Orientation Level Intubated/Tracheostomy - Unable to assess  Respiratory  Respiratory Pattern Regular;Unlabored  Chest Assessment Chest expansion symmetrical  Cardiac  ECG Monitor Yes  Cardiac Rhythm ST  Vascular  R Radial Pulse +2  L Radial Pulse +2  Edema Generalized;Right lower extremity;Left lower extremity  Integumentary  Integumentary (WDL) X  Skin Color Appropriate for ethnicity  Musculoskeletal  Musculoskeletal (WDL) X  Generalized Weakness Yes  Assistive Device None  GU Assessment  Genitourinary (WDL)  (HD)  Psychosocial  Psychosocial (WDL) X  Patient Behaviors Not interactive

## 2018-02-09 ENCOUNTER — Encounter: Admission: EM | Disposition: E | Payer: Self-pay | Source: Home / Self Care | Attending: Specialist

## 2018-02-09 DIAGNOSIS — Z9911 Dependence on respirator [ventilator] status: Secondary | ICD-10-CM

## 2018-02-09 LAB — RENAL FUNCTION PANEL
Albumin: 1.8 g/dL — ABNORMAL LOW (ref 3.5–5.0)
Anion gap: 11 (ref 5–15)
BUN: 92 mg/dL — ABNORMAL HIGH (ref 6–20)
CALCIUM: 7.6 mg/dL — AB (ref 8.9–10.3)
CHLORIDE: 102 mmol/L (ref 98–111)
CO2: 28 mmol/L (ref 22–32)
CREATININE: 4.46 mg/dL — AB (ref 0.61–1.24)
GFR, EST AFRICAN AMERICAN: 16 mL/min — AB (ref >60)
GFR, EST NON AFRICAN AMERICAN: 13 mL/min — AB (ref >60)
Glucose, Bld: 131 mg/dL — ABNORMAL HIGH (ref 70–99)
Phosphorus: 8.1 mg/dL — ABNORMAL HIGH (ref 2.5–4.6)
Potassium: 4.5 mmol/L (ref 3.5–5.1)
Sodium: 141 mmol/L (ref 135–145)

## 2018-02-09 LAB — GLUCOSE, CAPILLARY
GLUCOSE-CAPILLARY: 137 mg/dL — AB (ref 70–99)
Glucose-Capillary: 126 mg/dL — ABNORMAL HIGH (ref 70–99)
Glucose-Capillary: 108 mg/dL — ABNORMAL HIGH (ref 70–99)

## 2018-02-09 LAB — HEPATITIS B SURFACE ANTIBODY, QUANTITATIVE: HEPATITIS B-POST: 99.1 m[IU]/mL

## 2018-02-09 SURGERY — CREATION, TRACHEOSTOMY
Anesthesia: Choice

## 2018-02-09 MED ORDER — MORPHINE 100MG IN NS 100ML (1MG/ML) PREMIX INFUSION
10.0000 mg/h | INTRAVENOUS | Status: DC
  Filled 2018-02-09: qty 100

## 2018-02-09 MED ORDER — LORAZEPAM 2 MG/ML IJ SOLN: 2.0000 mg | INTRAMUSCULAR | Status: DC | PRN

## 2018-02-09 MED ORDER — MORPHINE BOLUS VIA INFUSION
5.0000 mg | INTRAVENOUS | Status: DC | PRN
  Filled 2018-02-09: qty 20

## 2018-02-09 MED ORDER — VECURONIUM BROMIDE 10 MG IV SOLR
10.0000 mg | INTRAVENOUS | Status: DC | PRN
  Administered 2018-02-09 (×3): 10 mg via INTRAVENOUS
  Filled 2018-02-09 (×3): qty 10

## 2018-02-10 LAB — SUSCEPTIBILITY, AER + ANAEROB

## 2018-02-10 LAB — SUSCEPTIBILITY RESULT

## 2018-02-12 LAB — CULTURE, RESPIRATORY

## 2018-02-12 LAB — CULTURE, RESPIRATORY W GRAM STAIN

## 2018-02-13 ENCOUNTER — Telehealth: Payer: Self-pay

## 2018-02-13 NOTE — Telephone Encounter (Signed)
No death summary on file. Death certificate placed in DK folder.

## 2018-02-13 NOTE — Telephone Encounter (Signed)
Recieved Death Certificate from _____[facility] ___ Delivered/Placed _________in nurse box___

## 2018-02-14 NOTE — Telephone Encounter (Signed)
[facility] [employer] informed death cert is ready for pick up. Nothing further needed.

## 2018-02-23 ENCOUNTER — Ambulatory Visit: Payer: BLUE CROSS/BLUE SHIELD | Admitting: General Surgery

## 2018-03-05 NOTE — Progress Notes (Signed)
Chaplain responded to an OR for EOL. Present at bedside was Wife, Mother, Son and wife and sister and Doristine Bosworth. Chaplain offered support and rendered a pray. Nurses came and pronounce him. Chaplain offered additional support. Chaplain left with pastor still attending.    03/03/2018 1700  Clinical Encounter Type  Visited With Patient and family together  Visit Type Death;Patient actively dying  Referral From Physician  Spiritual Encounters  Spiritual Needs Grief support

## 2018-03-05 NOTE — Progress Notes (Signed)
Patient extubated to comfort care, per Dr. Mortimer Fries and family request.

## 2018-03-05 NOTE — Death Summary Note (Signed)
DEATH SUMMARY   Patient Details  Name: [name] MRN: [id number] DOB: 1962/10/58  Admission/Discharge Information   Admit Date:  26-Feb-2018  Date of Death: Date of Death: 2018-03-11  Time of Death: Time of Death: 10-28-01  Length of Stay: 2022-10-14  Referring Physician: Marguerita Merles, MD   Reason(s) for Hospitalization  Cardiac arrest  Diagnoses  Preliminary cause of death:   Severe CHF anoxic brain injury Secondary Diagnoses (including complications and co-morbidities):  Principal Problem:   Cardiopulmonary arrest with successful resuscitation [facility]) Active Problems:   Acute on chronic combined systolic and diastolic CHF (congestive heart failure) ([place])   Acute pulmonary edema ([place])   Accelerated hypertension   Diabetes ([place])   Encounter for central line placement   Acute renal failure [facility])   Brief Hospital Course (including significant findings, care, treatment, and services provided and events leading to death)  Admitted for cardiac arrest and resp failure Dx of severe anoxic brain injury  Patient had very poor prognosis and did not have meaningful neurology recovery Patients family and wife made patient DNR They did NOT want trach for survival  Patient was made comfort care by family and wife with their consent     Pertinent Labs and Studies  Significant Diagnostic Studies Ct Abdomen Wo Contrast  Result Date: 02/03/2018 CLINICAL DATA:  Evaluate anatomy prior to potential percutaneous gastrostomy tube placement. EXAM: CT ABDOMEN WITHOUT CONTRAST TECHNIQUE: Multidetector CT imaging of the abdomen was performed following the standard protocol without IV contrast. COMPARISON:  Abdominal radiographs-01/28/2018; chest radiograph-earlier same day FINDINGS: Lower chest: Limited visualization of the lower thorax demonstrates small/trace bilateral pleural effusions, right greater than left. Consolidative opacities are seen within the bilateral lung bases with associated air  bronchograms, right greater than left. Cardiomegaly. Coronary artery calcifications. Diffuse decreased attenuation intra cardiac blood pool suggestive of anemia. Tip of dialysis catheter terminates within the distal SVC. Hepatobiliary: Hepatomegaly. There is mild nodularity of the hepatic contour. Layering debris is seen within otherwise normal-appearing gallbladder, similar to the 02/2009 examination and likely indicative of biliary sludge. No ascites. Pancreas: Normal noncontrast appearance of the pancreas Spleen: Interval development of a dystrophic calcification within a ill-defined hypoattenuating splenic lesion which appears otherwise unchanged compared to the 28-Oct-2008 examination and favored to represent a minimally complex splenic cyst. Adrenals/Urinary Tract: Minimal amount of bilateral perinephric stranding. No urinary obstruction. No renal stones. Normal noncontrast appearance the bilateral adrenal glands. Stomach/Bowel: The anterior wall the stomach is well apposed against the ventral wall of the abdomen without interposed liver or transverse colon. No hiatal hernia. No evidence of enteric obstruction. No pneumoperitoneum, pneumatosis or portal venous gas. Vascular/Lymphatic: Calcified atherosclerotic plaque within normal caliber abdominal aorta. No bulky retroperitoneal or mesenteric lymphadenopathy on this noncontrast examination. Other: Diffuse body wall anasarca. Scattered foci of subcutaneous emphysema within the abdominal pannus likely at the location of subcutaneous medication administration Musculoskeletal: No acute or aggressive osseous abnormalities. IMPRESSION: 1. Gastric anatomy amenable to potential percutaneous gastrostomy tube placement as indicated. 2. Trace/small bilateral effusions with bibasilar consolidative opacities with associated air bronchograms, right greater than left, worrisome for multifocal infection/aspiration. 3. Suspected cholelithiasis. 4.  Aortic Atherosclerosis  (ICD10-I70.0). Electronically Signed   By: Sandi Mariscal M.D.   On: 02/03/2018 14:09   Dg Abd 1 View  Result Date: 01/28/2018 CLINICAL DATA:  NG tube placement EXAM: ABDOMEN - 1 VIEW COMPARISON:  01/20/2018 FINDINGS: No enteric tube is demonstrated within the field of view. Mild gaseous distention of the stomach. IMPRESSION: No  enteric tube visualized. Electronically Signed   By: Lucienne Capers M.D.   On: 01/28/2018 05:53   Dg Abd 1 View  Result Date: 01/20/2018 CLINICAL DATA:  Encounter for NG tube placement EXAM: ABDOMEN - 1 VIEW COMPARISON:  01/20/2018 FINDINGS: Malpositioned nasogastric tube, coiled backwards overlying the mid esophagus with tip excluded from view Endotracheal tube overlies the tracheal air column with tip superior to the carina. No pneumothorax. Pacer leads overlie the left ventricular apex. Pulmonary vasculature remains indistinct with cephalization of flow. Suspected trace right-sided pleural effusion. No definite left-sided pleural effusion, though the left costophrenic angle is excluded from view. Nonobstructive bowel gas pattern. No pneumoperitoneum, pneumatosis or portal venous gas. IMPRESSION: 1. Malpositioned nasogastric tube.  Repositioning is advised. 2. Similar findings of pulmonary edema and trace right-sided pleural effusion. 3. Nonobstructive bowel gas pattern. Electronically Signed   By: Sandi Mariscal M.D.   On: 01/20/2018 14:03   Dg Abd 1 View  Result Date: 01/20/2018 CLINICAL DATA:  NG tube placement EXAM: ABDOMEN - 1 VIEW COMPARISON:  None. FINDINGS: There is no nasogastric tube identified in the lower chest or abdomen. There is mild gaseous distension of the colon and stomach. There is no evidence of pneumoperitoneum, portal venous gas or pneumatosis. There are no pathologic calcifications along the expected course of the ureters. The osseous structures are unremarkable. IMPRESSION: No nasogastric tube identified in the lower chest or abdomen. Electronically Signed    By: Kathreen Devoid   On: 01/20/2018 12:42   Dg Abd 1 View  Result Date: 01/20/2018 CLINICAL DATA:  Abdominal pain. EXAM: ABDOMEN - 1 VIEW COMPARISON:  None. FINDINGS: A right-sided pleural effusion is suspected layering posteriorly. Pacer paddles are noted on the chest. The bowel gas pattern is unremarkable. Air and stool scattered throughout the colon. A few scattered air-filled small bowel loops. No obvious free air. A right femoral line is noted. Bony structures are unremarkable. IMPRESSION: Suspect right pleural effusion. No plain film findings for an acute abdominal process. Electronically Signed   By: Marijo Sanes M.D.   On: 01/20/2018 11:22   Ct Head Wo Contrast  Result Date: 01/30/2018 CLINICAL DATA:  Anoxic brain injury EXAM: CT HEAD WITHOUT CONTRAST TECHNIQUE: Contiguous axial images were obtained from the base of the skull through the vertex without intravenous contrast. COMPARISON:  Head CT 01/28/2018 FINDINGS: Brain: There is diffuse blurring of the gray-white interface, slightly greater than on the prior examination. There is crowding of the basal cisterns. No acute hemorrhage or other extra-axial collection. Vascular: No abnormal hyperdensity of the major intracranial arteries or dural venous sinuses. No intracranial atherosclerosis. Skull: The visualized skull base, calvarium and extracranial soft tissues are normal. Sinuses/Orbits: No fluid levels or advanced mucosal thickening of the visualized paranasal sinuses. No mastoid or middle ear effusion. The orbits are normal. IMPRESSION: Diffuse blurring of the gray-white interface, compatible with global edema, slightly progressed compared to 01/28/2018. Mild worsening of basal cisternal crowding. Electronically Signed   By: Ulyses Jarred M.D.   On: 01/30/2018 13:17   Ct Head Wo Contrast  Result Date: 01/28/2018 CLINICAL DATA:  Cardiac arrest. History of hypertension and diabetes. EXAM: CT HEAD WITHOUT CONTRAST TECHNIQUE: Contiguous axial  images were obtained from the base of the skull through the vertex without intravenous contrast. COMPARISON:  CT neck January 20, 2018. FINDINGS: BRAIN: No intraparenchymal hemorrhage, mass effect nor midline shift. Mild blurring of the gray-white matter differentiation. Patchy supratentorial white matter hypodensities. The ventricles and sulci are normal. No  acute large vascular territory infarcts. No abnormal extra-axial fluid collections. Basal cisterns are patent. VASCULAR: Mild calcific atherosclerosis carotid bifurcations. SKULL/SOFT TISSUES: No skull fracture. No significant soft tissue swelling. ORBITS/SINUSES: The included ocular globes and orbital contents are normal.Mild paranasal sinus mucosal thickening without air-fluid levels. Included mastoid air cells are well aerated. OTHER: Life-support lines in place. IMPRESSION: 1. 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 2. Mild chronic small vessel ischemic changes. 3. Mild atherosclerosis. 4. Acute findings discussed with and reconfirmed by Dr.ALLISON WEBSTER on 01/28/2018 at 1:00 am. Electronically Signed   By: Elon Alas M.D.   On: 01/28/2018 01:04   Ct Soft Tissue Neck Wo Contrast  Result Date: 01/20/2018 CLINICAL DATA:  57 y/o  M; thyroid nodule. EXAM: CT NECK WITHOUT CONTRAST TECHNIQUE: Multidetector CT imaging of the neck was performed following the standard protocol without intravenous contrast. COMPARISON:  None. FINDINGS: Pharynx and larynx: Debris within the airways from intubation. Salivary glands: No inflammation, mass, or stone. Thyroid: 5.6 cm nodule within the left lobe of the thyroid. 18 mm nodule in the right lobe of thyroid. Lymph nodes: None enlarged or abnormal density. Vascular: Negative. Limited intracranial: Negative. Visualized orbits: Negative. Mastoids and visualized paranasal sinuses: Mild mucosal thickening of the left maxillary sinus. Normal aeration of the mastoid air cells. Skeleton: No acute  or aggressive process. Dental disease with multiple periapical cysts and absent crowns. Mild cervical spondylosis greatest at the C5-6 level. Upper chest: Endotracheal tube tip 3.4 cm above the carina. Moderate bilateral pleural effusions and dependent atelectasis of the lungs. Other: None. IMPRESSION: 1. Thyroid nodules measuring up to 5.6 cm in the left lobe of thyroid. Mass effect from the large left lobe of thyroid nodule displaces the airway rightward. 2. Endotracheal tube tip 3.4 cm above the carina. 3. Moderate bilateral pleural effusion with dependent atelectasis of the lungs. Electronically Signed   By: Kristine Garbe M.D.   On: 01/20/2018 04:44   Ir Gastrostomy Tube Mod Sed  Result Date: 02/03/2018 INDICATION: Dysphagia secondary to cardiac arrest and anoxic brain injury. Please perform percutaneous gastrostomy tube placement for enteric nutrition supplementation purposes. EXAM: PULL TROUGH GASTROSTOMY TUBE PLACEMENT COMPARISON:  Noncontrast abdominal CT - earlier same day MEDICATIONS: The patient is currently admitted to hospital receiving intravenous antibiotics; Antibiotics were administered within 1 hour of the procedure. Glucagon 1 mg IV CONTRAST:  20 mL of Isovue 300 administered into the gastric lumen. ANESTHESIA/SEDATION: None - patient is on continuous propofol drip FLUOROSCOPY TIME:  1 minutes 12 seconds (40 mGy) COMPLICATIONS: None immediate. PROCEDURE: Informed written consent was obtained from the patient's family following explanation of the procedure, risks, benefits and alternatives. A time out was performed prior to the initiation of the procedure. Ultrasound scanning was performed to demarcate the edge of the left lobe of the liver. Maximal barrier sterile technique utilized including caps, mask, sterile gowns, sterile gloves, large sterile drape, hand hygiene and Betadine prep. The left upper quadrant was sterilely prepped and draped. An oral gastric catheter was inserted  into the stomach under fluoroscopy. The existing nasogastric feeding tube was removed. The left costal margin and air opacified transverse colon were identified and avoided. Air was injected into the stomach for insufflation and visualization under fluoroscopy. Under sterile conditions a 17 gauge trocar needle was utilized to access the stomach percutaneously beneath the left subcostal margin after the overlying soft tissues were anesthetized with 1% Lidocaine with epinephrine. Needle position was confirmed within the stomach with aspiration of air and  injection of small amount of contrast. A single T tack was deployed for gastropexy. Over an Amplatz guide wire, a 9-French sheath was inserted into the stomach. A snare device was utilized to capture the oral gastric catheter. The snare device was pulled retrograde from the stomach up the esophagus and out the oropharynx. The 20-French pull-through gastrostomy was connected to the snare device and pulled antegrade through the oropharynx down the esophagus into the stomach and then through the percutaneous tract external to the patient. The gastrostomy was assembled externally. Contrast injection confirms position in the stomach. Several spot radiographic images were obtained in various obliquities for documentation. The patient tolerated procedure well without immediate post procedural complication. FINDINGS: After successful fluoroscopic guided placement, the gastrostomy tube is appropriately positioned with internal disc against the ventral aspect of the gastric lumen. IMPRESSION: Successful fluoroscopic insertion of a 20-French pull-through gastrostomy tube. The gastrostomy may be used immediately for medication administration and in 24 hrs for the initiation of feeds. Electronically Signed   By: Sandi Mariscal M.D.   On: 02/03/2018 14:24   Dg Chest Port 1 View  Result Date: 02/05/2018 CLINICAL DATA:  Mechanically assisted ventilation. EXAM: PORTABLE CHEST 1 VIEW  COMPARISON:  02/03/2018 FINDINGS: Endotracheal tube terminates 5.5 cm above the carina. Right jugular catheter terminates over the mid SVC. Left jugular catheter terminates over the upper SVC. The cardiac silhouette remains enlarged with similar appearance of pulmonary vascular congestion. Right greater than left lower lung hazy opacities are also similar to the prior study and likely represent small pleural effusions and atelectasis. No pneumothorax is identified. IMPRESSION: Unchanged pulmonary vascular congestion, pleural effusions, and bibasilar atelectasis. Electronically Signed   By: Logan Bores M.D.   On: 02/05/2018 11:30   Dg Chest Port 1 View  Result Date: 02/03/2018 CLINICAL DATA:  Ventilation. EXAM: PORTABLE CHEST 1 VIEW COMPARISON:  Radiograph of February 01, 2018. FINDINGS: Endotracheal tube is in good position. Right internal jugular catheter is unchanged. Stable cardiomegaly with central pulmonary vascular congestion is noted. Mild bibasilar edema or atelectasis is noted with small right pleural effusion. No pneumothorax is noted. Bony thorax is unremarkable. IMPRESSION: Stable cardiomegaly with central pulmonary vascular congestion. Endotracheal tube in good position. Mild bibasilar subsegmental atelectasis or edema is noted with small right pleural effusion. Electronically Signed   By: Marijo Conception, M.D.   On: 02/03/2018 10:19   Dg Chest Port 1 View  Result Date: 02/01/2018 CLINICAL DATA:  Cardiac and respiratory arrest.  Post intubation. EXAM: PORTABLE CHEST 1 VIEW COMPARISON:  01/31/2018 FINDINGS: Endotracheal tube 6.9 cm above the carina. Advancement with 3-4 cm may be considered. Central lines in stable position. Enlarged globular heart.  Mediastinal contours appear intact. Bilateral pleural effusions and interstitial pulmonary edema. More focal airspace consolidation in the right lung base cannot be excluded. Osseous structures are without acute abnormality. Soft tissues are grossly  normal. IMPRESSION: Endotracheal tube 6.9 cm above the carina. Advancement with 3-4 cm may be considered. Enlarged globular heart. This may be due to cardiomegaly or pericardial effusion. Interstitial pulmonary edema with moderate in size bilateral pleural effusions. More focal airspace consolidation in the right lung base cannot be excluded. Electronically Signed   By: Fidela Salisbury M.D.   On: 02/01/2018 09:36   Dg Chest Port 1 View  Result Date: 01/31/2018 CLINICAL DATA:  Check endotracheal tube EXAM: PORTABLE CHEST 1 VIEW COMPARISON:  01/30/2018 FINDINGS: Endotracheal tube is noted at the thoracic inlet. Bilateral jugular catheters are again noted and  stable. Cardiac shadow is stable but enlarged. Persistent right-sided infiltrate and effusion is seen. The left lung is clear. IMPRESSION: Right-sided effusion and infiltrate stable from the prior exam. Tubes and lines as described. Electronically Signed   By: Inez Catalina M.D.   On: 01/31/2018 10:49   Dg Chest Port 1 View  Result Date: 01/30/2018 CLINICAL DATA:  Central line placement EXAM: PORTABLE CHEST 1 VIEW COMPARISON:  01/30/2018 at 0918 hours FINDINGS: Endotracheal tube terminates 6 cm above the carina. Right IJ dual lumen catheter terminates in the lower SVC. Left IJ venous catheter terminates in the left brachiocephalic vein. Cardiomegaly with mild to moderate interstitial edema. Multifocal pneumonia is also possible. Suspected bilateral pleural effusions, right greater than left. No pneumothorax. IMPRESSION: Endotracheal tube terminates 6 cm above the carina. Right IJ catheter terminates in the lower SVC. Left IJ catheter terminates in the left brachiocephalic vein. Cardiomegaly with suspected mild to moderate interstitial edema and bilateral pleural effusions. Multifocal pneumonia is also possible. Electronically Signed   By: Julian Hy M.D.   On: 01/30/2018 21:42   Dg Chest Port 1 View  Result Date: 01/30/2018 CLINICAL DATA:  57  y.o. male.with a history of type 2 diabetes hypertension and sleep apnea who was brought to the ED by his spouse in cardiac and respiratory arrest. now on vent, smoker EXAM: PORTABLE CHEST 1 VIEW COMPARISON:  Chest x-rays dated 01/28/2018 and 01/24/2018. FINDINGS: Study is hypoinspiratory. There is continued bilateral pulmonary edema pattern, not significantly changed compared to the most recent study of 01/28/2018, increased compared to the earlier study of 01/24/2018. Given the low lung volumes, heart size and mediastinal contours appear stable. Endotracheal tube tip is at the level of the clavicles, approximately 6 cm above the carina. LEFT jugular vein central line is stable in position with tip at the level of the upper SVC. No pneumothorax seen. Probable small bilateral pleural effusions. IMPRESSION: 1. Continued bilateral pulmonary edema pattern, not significantly changed compared to most recent chest x-ray of 01/28/2018. 2. Probable small bilateral pleural effusions. 3. Cardiomegaly. 4. Support apparatus appears stable in position. Endotracheal tube tip is at the level of the clavicles, approximately 6 cm above the carina. Electronically Signed   By: Franki Cabot M.D.   On: 01/30/2018 09:37   Dg Chest Port 1 View  Result Date: 01/28/2018 CLINICAL DATA:  Line placement EXAM: PORTABLE CHEST 1 VIEW COMPARISON:  01/28/2018 FINDINGS: Endotracheal tube measures 6.8 cm above the carina. A left central venous catheter has been placed with tip over the confluence of the brachiocephalic vein and IVC. Shallow inspiration. Cardiac enlargement. Bilateral perihilar infiltrates. Probable small right pleural effusion. No pneumothorax is visible. IMPRESSION: Appliances appear in satisfactory position. Cardiac enlargement with bilateral perihilar infiltrates. Small right pleural effusion. No pneumothorax. Electronically Signed   By: Lucienne Capers M.D.   On: 01/28/2018 05:52   Dg Chest Portable 1 View  Result Date:  01/28/2018 CLINICAL DATA:  Short of breath EXAM: PORTABLE CHEST 1 VIEW COMPARISON:  01/24/2018, 01/21/2018 FINDINGS: Interval intubation, tip of the endotracheal tube is about 4 cm superior to the carina. Worsening bilateral interstitial and alveolar disease. No large effusion. Stable mild cardiomegaly. No pneumothorax. IMPRESSION: 1. Endotracheal tube tip about 3.5 cm superior to the carina. 2. Cardiomegaly. Interim worsening of bilateral interstitial and alveolar airspace disease. Electronically Signed   By: Donavan Foil M.D.   On: 01/28/2018 00:29   Dg Chest Port 1 View  Result Date: 01/24/2018 CLINICAL DATA:  Respiratory failure  EXAM: PORTABLE CHEST 1 VIEW COMPARISON:  01/21/2018, 01/20/2018, 12/13/2017 FINDINGS: Removal of the endotracheal tube. Slight increased upper lobe ground-glass opacity. Improved aeration at the bases. Stable slightly enlarged cardiomediastinal silhouette. No pneumothorax. IMPRESSION: 1. Removal of endotracheal tube 2. Improved aeration at the bilateral lung bases. Slight interval increase in bilateral upper lobe ground-glass opacity. 3. Stable mild cardiomegaly Electronically Signed   By: Donavan Foil M.D.   On: 01/24/2018 03:58   Dg Chest Port 1 View  Result Date: 01/21/2018 CLINICAL DATA:  Acute respiratory failure. EXAM: PORTABLE CHEST 1 VIEW COMPARISON:  01/20/2018 FINDINGS: The endotracheal tube is in good position at the mid tracheal level. The lungs demonstrate improved aeration with resolving edema and atelectasis. Probable persistent small layering right pleural effusion. IMPRESSION: Stable position of the endotracheal tube. Improved lung aeration with resolving edema and atelectasis. Electronically Signed   By: Marijo Sanes M.D.   On: 01/21/2018 08:36   Dg Chest Portable 1 View  Result Date: 01/20/2018 CLINICAL DATA:  57 y/o  M; status post intubation. EXAM: PORTABLE CHEST 1 VIEW COMPARISON:  01/20/2018 chest radiograph FINDINGS: Stable cardiomegaly given  projection and technique. Interstitial and alveolar pulmonary edema. Probable small effusions. Endotracheal tube tip 4.5 cm above the carina. Transcutaneous pacing pads noted. Bones are unremarkable. IMPRESSION: 1. Endotracheal tube tip 4.5 cm above the carina. 2. Stable cardiomegaly and pulmonary edema. Probable small effusions. Electronically Signed   By: Kristine Garbe M.D.   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 Stable cardiomegaly. Nonaneurysmal thoracic aorta. Diffuse bilateral airspace disease and vascular congestion consistent with pulmonary edema. No significant effusion or pneumothorax. No acute osseous abnormality. IMPRESSION: Stable cardiomegaly with pulmonary edema. Superimposed pneumonia is not entirely excluded but believed less likely. Electronically Signed   By: Ashley Royalty M.D.   On: 01/20/2018 02:56   Dg Addison Bailey G Tube Plc W/fl W/rad  Result Date: 01/20/2018 CLINICAL DATA:  Patient presents for Dobbhoff tube placement. EXAM: NASO G TUBE PLACEMENT WITH FL AND WITH RAD FLUOROSCOPY TIME:  Fluoroscopy Time:  0.3 minute Radiation Exposure Index (if provided by the fluoroscopic device): 1.3 mGy Number of Acquired Spot Images: 0 COMPARISON:  None. FINDINGS: Multiple attempts at placing a Dobbhoff tube were made through the right and left nare. The tip of the Dobbhoff tube would not progressed beyond the level of the mid trachea on repeated attempts. Attempts were made at advancing the Dobbhoff tube following deflating the tracheostomy cuff, but the Dobbhoff tube could not advance any further. Examination was terminated at this time. IMPRESSION: 1. Multiple unsuccessful attempts at placing a Dobbhoff tube which would not progressed beyond the level of the mid trachea Electronically Signed   By:  Kathreen Devoid   On: 01/20/2018 16:41    Microbiology No results found for this or any previous visit (from the past 240 hour(s)).  Lab Basic Metabolic Panel: Recent Labs  Lab 02/08/18 0845 02/08/18 1413 02-Mar-2018 0922  NA 140  --  141  K 5.6*  --  4.5  CL 101  --  102  CO2 23  --  28  GLUCOSE 136*  --  131*  BUN 125*  --  92*  CREATININE 6.17*  --  4.46*  CALCIUM 7.5*  --  7.6*  PHOS  --  11.5* 8.1*  Liver Function Tests: Recent Labs  Lab 02/08/18 0845 02-26-2018 0922  AST 87*  --   ALT 33  --   ALKPHOS 49  --   BILITOT 1.0  --   PROT 6.4*  --   ALBUMIN 1.9* 1.8*   No results for input(s): LIPASE, AMYLASE in the last 168 hours. No results for input(s): AMMONIA in the last 168 hours. CBC: Recent Labs  Lab 02/08/18 0845 02/08/18 1711  WBC 26.4* 28.3*  HGB 8.3* 8.4*  HCT 24.9* 25.2*  MCV 92.2 92.6  PLT 317 356   Cardiac Enzymes: No results for input(s): CKTOTAL, CKMB, CKMBINDEX, TROPONINI in the last 168 hours. Sepsis Labs: Recent Labs  Lab 02/08/18 0845 02/08/18 1711  WBC 26.4* 28.3*      Kavish Lafitte 02/14/2018, 8:45 AM

## 2018-03-05 NOTE — Progress Notes (Signed)
   22-Feb-2018 1155  Clinical Encounter Type  Visited With Patient not available;Health care provider  Visit Type Follow-up  Spiritual Encounters  Spiritual Needs Prayer   Based on morning report, chaplain attempted to follow up with patient/family.  Patient in bed, nurse at bedside.  Chaplain offered silent and energetic prayers for patient, family, and care team.

## 2018-03-05 NOTE — Progress Notes (Signed)
   CHIEF COMPLAINT:   Chief Complaint  Patient presents with  . Cardiac Arrest    Subjective  Remains intubated Severe brain damage Critically ill On full vent support Myoclonic jerks-started on versed infusion Patient is suffering-wife updated Was made DNR by wife  Plan for comfort care tomorrow     REVIEW OF SYSTEMS  PATIENT IS UNABLE TO PROVIDE COMPLETE REVIEW OF SYSTEM S DUE TO SEVERE CRITICAL ILLNESS AND ENCEPHALOPATHY   Objective   PHYSICAL EXAMINATION: GENERAL:critically ill appearing, +resp distress HEAD: Normocephalic, atraumatic.  EYES: Pupils equal, round, reactive to light.  No scleral icterus.  MOUTH: Moist mucosal membrane. NECK: Supple. No thyromegaly. No nodules. No JVD.  PULMONARY: +rhonchi, +wheezing CARDIOVASCULAR: S1 and S2. Regular rate and rhythm. No murmurs, rubs, or gallops.  GASTROINTESTINAL: Soft, nontender, -distended. No masses. Positive bowel sounds. No hepatosplenomegaly.  MUSCULOSKELETAL: No swelling, clubbing, or edema.  NEUROLOGIC: obtunded SKIN:intact,warm,dry    VITALS:  height is 6\' 1"  (1.854 m) and weight is 117.4 kg. His temperature is 99.3 F (37.4 C). His blood pressure is 157/86 (abnormal) and his pulse is 101 (abnormal). His respiration is 26 (abnormal) and oxygen saturation is 98%.   I personally reviewed Labs under Results section.  Radiology Reports No results found.     Assessment/Plan:  57 year old male with past medical history of ischemic cardiomyopathy ejection fraction of 20 to 35%, chronic systolic CHF, Obstructive sleep apnea, hypertension, diabetes who presented to the hospital as he was found unresponsive and noted to be in pulseless cardiac arrest    1.Severe Hypoxic and Hypercapnic Respiratory Failure -continue Full MV support -continue Bronchodilator Therapy -Wean Fio2 and PEEP as tolerated Respiratory failure-patient was intubated after his cardiac arrest. Not showing any signs of meaningful  neurological recovery.  2.Status post cardiac arrest.  Status post targeted temperature management. No significant improvement in neurologic status, anoxic brain injury Appreciate neurology.   Plan for Comfort Care tomorrow as per wife   Critical Care Time devoted to patient care services described in this note is 31 minutes.   Overall, patient is critically ill, prognosis is guarded.  Patient with Multiorgan failure and at high risk for cardiac arrest and death.    Prognosis is very poor Patient now DNR status Plan for comfort care on Friday    Crew Goren Patricia Pesa, M.D.  [facility]  Medical Director [place] Director [facility] Cardio-Pulmonary Department

## 2018-03-05 NOTE — Progress Notes (Signed)
This RN was in room administering meds, vent began to alarm and sats dropped into 80s. O2 breaths given and patient suctioned with nothing coming up ETT. Sats further dropped into 70s. Pt taken off vent and manually bagged, sats began to come up in 80s and 90s with copious secretions coming up ETT. Pt placed back on vent, suctioned with inline suction, large amounts of thick, yellow secretions up ETT. Dr. Mortimer Fries called wife to inform of change in status.

## 2018-03-05 NOTE — Progress Notes (Signed)
Daily Progress Note   Patient Name: [name]       Date: Feb 16, 2018 DOB: [birth year]  Age: 57 y.o. MRN#: [id number] Attending Physician: Nicholes Mango, MD Primary Care Physician: Marguerita Merles, MD Admit Date: 01/19/2018  Reason for Consultation/Follow-up: Establishing goals of care  Subjective: Patient is resting in bed. He is intubated. Currently on Versed and Fentanyl drips with PRN Vecuronium for symptom management.  Met with wife at bedside. Plan for comfort care tomorrow when other immediate family members can be present.   Length of Stay: 12  Current Medications: Scheduled Meds:  . B-complex with vitamin C  1 tablet Per Tube QHS  . chlorhexidine gluconate (MEDLINE KIT)  15 mL Mouth Rinse BID  . Chlorhexidine Gluconate Cloth  6 each Topical Q0600  . famotidine  20 mg Per Tube QHS  . feeding supplement (PRO-STAT SUGAR FREE 64)  30 mL Per Tube BID  . feeding supplement (VITAL HIGH PROTEIN)  1,000 mL Per Tube Q24H  . heparin injection (subcutaneous)  5,000 Units Subcutaneous Q8H  . insulin aspart  0-20 Units Subcutaneous Q4H  . insulin glargine  5 Units Subcutaneous QHS  . ipratropium-albuterol  3 mL Nebulization Q6H  . mouth rinse  15 mL Mouth Rinse 10 times per day  . metoprolol tartrate  5 mg Intravenous Q6H  . multivitamin  15 mL Per Tube Daily  . sodium chloride flush  10-40 mL Intracatheter Q12H    Continuous Infusions: . sodium chloride Stopped (03/03/2018 1838)  . sodium chloride 10 mL/hr at 02-16-2018 0700  . sodium chloride    . sodium chloride    . fentaNYL infusion INTRAVENOUS 400 mcg/hr (02-16-18 1048)  . midazolam (VERSED) infusion 4 mg/hr (2018/02/16 1157)    PRN Meds: sodium chloride, sodium chloride, sodium chloride, acetaminophen **OR** acetaminophen,  alteplase, fentaNYL, heparin, heparin, hydrALAZINE, lidocaine (PF), lidocaine-prilocaine, midazolam, pentafluoroprop-tetrafluoroeth, sodium chloride flush, vecuronium  Physical Exam  Pulmonary/Chest:  Intubated  Neurological: He is alert.  Feet twitching.             Vital Signs: BP (!) 153/77   Pulse (!) 104   Temp 99.9 F (37.7 C)   Resp (!) 25   Ht _0  (1.854 m)   Wt 117.4 kg   SpO2 100%   BMI 34.15 kg/m  SpO2:  SpO2: 100 % O2 Device: O2 Device: Ventilator O2 Flow Rate: O2 Flow Rate (L/min): 0 L/min  Intake/output summary:   Intake/Output Summary (Last 24 hours) at February 11, 2018 1224 Last data filed at 11-Feb-2018 0700 Gross per 24 hour  Intake 857 ml  Output 1881 ml  Net -1024 ml   LBM: Last BM Date: 02/08/18 Baseline Weight: Weight: 110.6 kg Most recent weight: Weight: 117.4 kg       Palliative Assessment/Data: Intubated    Flowsheet Rows     Most Recent Value  Intake Tab  Referral Department  Hospitalist  Unit at Time of Referral  ICU  Palliative Care Primary Diagnosis  Cardiac  Date Notified  02/07/18  Palliative Care Type  New Palliative care  Reason for referral  Clarify Goals of Care  Date of Admission  01/20/2018  Date first seen by Palliative Care  02/07/18  # of days Palliative referral response time  0 Day(s)  # of days IP prior to Palliative referral  11  Clinical Assessment  Psychosocial & Spiritual Assessment  Palliative Care Outcomes      Patient Active Problem List   Diagnosis Date Noted  . Acute renal failure ([place])   . Cardiopulmonary arrest with successful resuscitation ([place]) 01/28/2018  . Acute on chronic combined systolic and diastolic CHF (congestive heart failure) ([place]) 01/28/2018  . Acute pulmonary edema ([place]) 01/28/2018  . Accelerated hypertension 01/28/2018  . Diabetes ([place]) 01/28/2018  . Encounter for central line placement   . Acute hypoxemic respiratory failure ([place]) 01/20/2018  . Lipoma of back 11/08/2017    Palliative  Care Assessment & Plan    Recommendations/Plan:  Plans for shift to comfort care tomorrow.   Code Status:    Code Status Orders  (From admission, onward)         Start     Ordered   02/07/18 1658  Do not attempt resuscitation (DNR)  Continuous    Question Answer Comment  In the event of cardiac or respiratory ARREST Do not call a "code blue"   In the event of cardiac or respiratory ARREST Do not perform Intubation, CPR, defibrillation or ACLS   In the event of cardiac or respiratory ARREST Use medication by any route, position, wound care, and other measures to relive pain and suffering. May use oxygen, suction and manual treatment of airway obstruction as needed for comfort.      02/07/18 1657        Code Status History    Date Active Date Inactive Code Status Order ID Comments User Context   01/28/2018 0248 02/07/2018 1657 Full Code [id number]  Erlene Quan, NP Inpatient   01/28/2018 0213 01/28/2018 0248 Full Code [id number]  Lance Coon, MD Inpatient   01/20/2018 0414 01/24/2018 1627 Full Code [id number]  Arta Silence, MD ED       Prognosis:   Very poor.   Discharge Planning:  Anticipated Hospital Death Comfort care measures tomorrow.   Care plan was discussed with CCM K[place].  Thank you for allowing the Palliative Medicine Team to assist in the care of this patient.   Time In: 10:15 Time Out: 10:50 Total Time 35 min Prolonged Time Billed no      Greater than 50%  of this time was spent counseling and coordinating care related to the above assessment and plan.  Asencion Gowda, NP  Please contact Palliative Medicine Team phone at [phone] for questions and concerns.

## 2018-03-05 NOTE — Progress Notes (Signed)
Time of death 79. This RN and Malachy Mood, RN pronounced.

## 2018-03-05 NOTE — Progress Notes (Signed)
[facility] [place] NAME: [name]    MR#:  [id number]  DATE OF BIRTH:  1961/01/16  SUBJECTIVE:   Febrile, hemodialysis  , remains on a low-dose propofol drip.  status post IR guided gastrostomy tube placement and now on tube feeds and tolerating it well.  Remains intubated.  No family members at bedside  REVIEW OF SYSTEMS:    Review of Systems  Unable to perform ROS: Intubated    Nutrition: Tube feeds Tolerating Diet: Yes  DRUG ALLERGIES:   Allergies  Allergen Reactions  . Enalapril Maleate Anaphylaxis  . Iodinated Diagnostic Agents Itching  . Isosorbide Mononitrate Er [Isosorbide Dinitrate] Other (See Comments)    "made me not be able to think or function"  . Canagliflozin Other (See Comments)    VITALS:  Blood pressure (!) 145/77, pulse (!) 107, temperature 100 F (37.8 C), resp. rate (!) 25, height 6\' 1"  (1.854 m), weight 117.4 kg, SpO2 100 %.  PHYSICAL EXAMINATION:   Physical Exam  GENERAL:  57 y.o.-year-old patient lying in bed intubated & Sedated.  EYES: PERRL but minimally responsive.  HEENT: Head atraumatic, normocephalic. ET and OG tubes in place.   NECK:  Supple, no jugular venous distention. No thyroid enlargement, no tenderness.  LUNGS: Normal breath sounds bilaterally, no wheezing, rales, rhonchi. No use of accessory muscles of respiration.  CARDIOVASCULAR: S1, S2 normal. No murmurs, rubs, or gallops.  ABDOMEN: Soft, nontender, nondistended. Bowel sounds present. No organomegaly or mass.  + G-tube in place.  EXTREMITIES: No cyanosis, clubbing or edema b/l.    NEUROLOGIC: Sedated & Intubated.  Having some myoclonic jerks occasionally, positive clonus at times. PSYCHIATRIC: Sedated & Intubated.   SKIN: No obvious rash, lesion, or ulcer.    LABORATORY PANEL:   CBC Recent Labs  Lab 02/08/18 1711  WBC 28.3*  HGB 8.4*  HCT 25.2*  PLT 356    ------------------------------------------------------------------------------------------------------------------  Chemistries  Recent Labs  Lab 02/06/18 0503  02/08/18 0845 03-07-18 0922  NA 138   < > 140 141  K 4.4   < > 5.6* 4.5  CL 98   < > 101 102  CO2 27   < > 23 28  GLUCOSE 113*   < > 136* 131*  BUN 84*   < > 125* 92*  CREATININE 4.76*   < > 6.17* 4.46*  CALCIUM 7.7*   < > 7.5* 7.6*  MG 2.5*  --   --   --   AST  --   --  87*  --   ALT  --   --  33  --   ALKPHOS  --   --  49  --   BILITOT  --   --  1.0  --    < > = values in this interval not displayed.   ------------------------------------------------------------------------------------------------------------------  Cardiac Enzymes No results for input(s): TROPONINI in the last 168 hours. ------------------------------------------------------------------------------------------------------------------  RADIOLOGY:  No results found.   ASSESSMENT AND PLAN:   57 year old male with past medical history of ischemic cardiomyopathy ejection fraction of 20 to 74%, chronic systolic CHF, Obstructive sleep apnea, hypertension, diabetes who presented to the hospital as he was found unresponsive and noted to be in pulseless cardiac arrest.  1.  Altered mental status/encephalopathy-suspected to be secondary to underlying anoxic brain injury. - Neurology was consulted, they have explained to the patient's wife about patient's poor prognosis and unlikely that he would recover from this.   -  As per neurology patient does not meet fulfill criteria for brain death.  Family and wife are hopeful.  2. Acute Respiratory failure-patient was intubated after his cardiac arrest.  Not showing any signs of meaningful neurological recovery.  -Patient has increasing secretions and thought to have pneumonia suspected aspiration.  Status post bronchoscopy on 02/03/18  but as per intensivist not much secretions were removed.   -treated with  IV  meropenem, further pulmonary toileting as per intensivist. -ENT is recommending to transfer the patient to tertiary care center as they cannot do tracheostomy in view of goiter  3.  Acute kidney injury with oliguria-secondary to underlying cardiac arrest and cardiorenal hemodynamics. -  Patient still requiring intermittent HD.  No plans for hemodialysis today.  Last hemodialysis on August 3 -As per nephrology plan for PermCath placement this week as patient is not dialysis dependent.   4.  Pulseless cardiac arrest-as a result of worsening respiratory failure and flash pulmonary edema. - Patient was resuscitated in the ER for about 17 minutes prior to regaining spontaneous circulation. - No further arrhythmias but prognosis is quite poor 5.  Sepsis/septic shock- thought to have possible aspiration pneumonia.   Continue empiric Meropenem. - pt. Off vasopressors now.  Cultures remain (-).     6.  History of ischemic cardiomyopathy with ejection fraction of 20 to 25%- patient does not appear to be volume overloaded presently.   7.  Leukocytosis-secondary to suspected sepsis.  Follow with IV antibiotic therapy and it's improving.  8.  Failure to thrive-palliative care with the family members and patient's CODE STATUS has been changed to DO NOT RESUSCITATE.  Plan for comfort care on Friday   Patient has a very poor prognosis given his multiple comorbidities and now with multiorgan failure and likely anoxic brain injury.  Family and wife want to cont. Aggressive care.     All the records are reviewed and case discussed with Care Management/Social Worker. Management plans discussed with the patient, family and they are in agreement.  CODE STATUS: Full code  DVT Prophylaxis: Hep SQ  TOTAL TIME TAKING CARE OF THIS PATIENT: 28 minutes.   POSSIBLE D/C IN unclear, DEPENDING ON CLINICAL CONDITION and course   Nicholes Mango M.D on 2018-02-16 at 2:46 PM  Between 7am to 6pm - Pager -  [phone]  After 6pm go to [email or web address] - password [email or web address] [place] [facility]  Office  [phone]  CC: Primary care physician; Marguerita Merles, MD

## 2018-03-05 NOTE — Progress Notes (Signed)
Patients oxygen saturations dropped prior to RT entering room, RN had taken patient off ventilator and manually ventilated patient, and was putting patient back on vent as RT entered. Patient was suctioned for copious thick yellow secretions. Saturations still in the 80's, Rt increased to 100% and performed a recruitment maneuver with no success in increasing saturations. Rt lavaged and repeated suction and got back very thick yellow again. Patients saturations still in the low to mid 80's. RT left patient on 100% FIO2 as per Dr. Zoila Shutter order. Dr. Mortimer Fries trying to get in touch with wife at this time.

## 2018-03-05 NOTE — Progress Notes (Signed)
Nutrition Follow-up  DOCUMENTATION CODES:   Obesity unspecified  INTERVENTION:  Continue Vital High Protein at 40 mL/hr + Pro-Stat 30 mL BID via G-tube for now.  Recommend discontinuing tube feeds when patient transitions to comfort care.  NUTRITION DIAGNOSIS:   Inadequate oral intake related to inability to eat as evidenced by NPO status.  Ongoing - addressing with TF regimen.  GOAL:   Provide needs based on ASPEN/SCCM guidelines  Met with TF regimen.  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   -Neurological exam consistent with diffuse anoxic brain injury, but does not fulfill criteria for brain death. -Patient was on CRRT from 7/30 to 8/1. Received iHD on 8/1 with UF 1509 mL and on 8/2 with UF 1500 mL. -ENT was consulted for tracheostomy tube placement as family wishes to proceed with full scope care.  -Tracheostomy tube placement was attempted on 8/1 but patient desaturated so it has been delayed until lung function improves. Tentative plan for 8/8. -GI attempted to place NGT/OGT on 8/1 but failed. Recommended IR attempt G-tube placement. -Patient s/p G-tube placement by IR on 8/2. -Patient s/p bronchoscopy on 8/2.  Patient remains intubated and sedated. No family members present at time of RD assessment. Since plan  is for transition to comfort care tomorrow will not make any further adjustments to TF regimen.  Access: 20 Fr. G-tube with C-clamp placed on 8/2 by IR  TF: pt tolerating VHP @ 40 + Pro-Stat 30 mL BID  Patient is currently intubated on ventilator support Ve: 12.3 L/min Temp (24hrs), Avg:99.6 F (37.6 C), Min:98.6 F (37 C), Max:100.8 F (38.2 C)  Propofol: N/A  Medications reviewed and include: B-complex with C QHS, famotidine, heparin gtt, Novolog 0-20 units Q4hrs, Lantus 5 units QHS, metoprolol, liquid MVI daily, fentanyl gtt, meropenem, Versed gtt. Propofol gtt has been transitioned off.  Labs reviewed: CBG 108-137, BUN 92, Creatinine 4.46, Phosphorus 8.1.  I/O: 1865 mL UOP yesterday (0.7 mL/kg/hr); 16 mL removed on HD yesterday  Weight trend: 117.4 kg on 8/8; +6.8 kg from admission  Discussed with RN and on rounds. Plan is for transition to comfort care tomorrow (8/9).  Diet Order:   Diet Order            Diet NPO time specified  Diet effective now              EDUCATION NEEDS:   No education needs have been identified at this time  Skin:  Skin Assessment: Reviewed RN Assessment(ecchymosis to abdomen; weeping to bilateral upper and lower extremities)  Last BM:  02/08/2018 - small type 7  Height:   Ht Readings from Last 1 Encounters:  01/29/18 6' 1"  (1.854 m)    Weight:   Wt Readings from Last 1 Encounters:  03/11/18 117.4 kg    Ideal Body Weight:  83.6 kg  BMI:  Body mass index  is 34.15 kg/m.  Estimated Nutritional Needs:   Kcal:  2286 (PSU 2003b w/ MSJ 1921)  Protein:  110-130 grams (1.3-1.6 grams/kg IBW)  Fluid:  2 L/day (25 mL/kg IBW)  Willey Blade, MS, RD, LDN Office: [phone] Pager: [phone] After Hours/Weekend Pager: [phone]

## 2018-03-05 NOTE — Progress Notes (Signed)
[facility]  ROUNDING NOTE   Subjective:   Hemodialysis treatment yesterday. No ultrafiltration. Tolerated treatment well.   Tmax 100.8  UOP 1889m  Objective:  Vital signs in last 24 hours:  Temp:  [98.6 F (37 C)-100.8 F (38.2 C)] 100 F (37.8 C) (08/08 1000) Pulse Rate:  [94-117] 115 (08/08 1000) Resp:  [18-29] 23 (08/08 1000) BP: (135-170)/(71-91) 170/81 (08/08 1000) SpO2:  [90 %-100 %] 91 % (08/08 1000) FiO2 (%):  [35 %] 35 % (08/08 0813) Weight:  [117.4 kg-122.2 kg] 117.4 kg (08/08 0419)  Weight change: 5 kg Filed Weights   02/08/18 1634 02/08/18 2015 [id number]  Weight: 122.2 kg 122 kg 117.4 kg    Intake/Output: I/O last 3 completed shifts: In: 1769 [I.V.:1289; NG/GT:480] Out: 2681 [Urine:2665; Other:16]   Intake/Output this shift:  No intake/output data recorded.  Physical Exam: General: Critically ill  Head: ETT  Eyes: Anicteric   Neck: RIJ LIJ lines  Lungs:  Bilateral crackles, PRVC FiO2 35%  Heart: Regular rate and rhythm  Abdomen:  Soft, nontender, +PEG  Extremities: + peripheral edema.  Neurologic: Intubated, on sedation  Skin: No lesions  Access: RIJ temp HD catheter 7/29 Tukov    Basic Metabolic Panel: Recent Labs  Lab 02/05/18 1139 02/05/18 1643 02/06/18 0503 02/07/18 0506 02/08/18 0845 02/08/18 1413 [id number]  NA 138  --  138 138 140  --  141  K 4.2  --  4.4 4.8 5.6*  --  4.5  CL 100  --  98 100 101  --  102  CO2 28  --  27 25 23   --  28  GLUCOSE 173*  --  113* 118* 136*  --  131*  BUN 68*  --  84* 102* 125*  --  92*  CREATININE 3.99*  --  4.76* 5.53* 6.17*  --  4.46*  CALCIUM 7.6*  --  7.7* 7.7* 7.5*  --  7.6*  MG 2.4 2.4 2.5*  --   --   --   --   PHOS 6.4* 6.9* 7.8*  7.8* 10.5*  --  11.5* 8.1*    Liver Function Tests: Recent Labs  Lab 02/06/18 0503 02/07/18 0506 02/08/18 0845 0August 27, [id number]  AST  --   --  87*  --   ALT  --   --  33  --   ALKPHOS  --   --  49  --   BILITOT  --   --  1.0  --    PROT  --   --  6.4*  --   ALBUMIN 1.7* 1.8* 1.9* 1.8*   No results for input(s): LIPASE, AMYLASE in the last 168 hours. No results for input(s): AMMONIA in the last 168 hours.  CBC: Recent Labs  Lab 02/05/18 1139 02/06/18 0929 02/07/18 0506 02/08/18 0845 02/08/18 1711  WBC 22.4* 23.2* 22.2* 26.4* 28.3*  NEUTROABS 18.3*  --   --   --   --   HGB 8.2* 8.1* 8.2* 8.3* 8.4*  HCT 24.5* 23.6* 23.7* 24.9* 25.2*  MCV 92.8 92.5 92.0 92.2 92.6  PLT 172 203 245 317 356    Cardiac Enzymes: No results for input(s): CKTOTAL, CKMB, CKMBINDEX, TROPONINI in the last 168 hours.  BNP: Invalid input(s): POCBNP  CBG: Recent Labs  Lab 02/08/18 1600 02/08/18 1942 02/08/18 2331 [id number] 0August 27, [id number]  GLUCAP 127* 127* 137* 126* 108*    Microbiology: Results for orders placed or performed during the hospital encounter of 01/23/2018  MRSA PCR Screening     Status: None   Collection Time: 01/28/18  5:00 AM  Result Value Ref Range Status   MRSA by PCR NEGATIVE NEGATIVE Final    Comment:        The GeneXpert MRSA Assay (FDA approved for NASAL specimens only), is one component of a comprehensive MRSA colonization surveillance program. It is not intended to diagnose MRSA infection nor to guide or monitor treatment for MRSA infections. Performed at [facility], [address] [place], [place] [address]   Urine Culture     Status: None   Collection Time: 01/28/18  6:16 AM  Result Value Ref Range Status   Specimen Description   Final    URINE, RANDOM Performed at [facility], [address] [place], [place] [address]    Special Requests   Final    NONE Performed at [facility], [address] [place], [place] [address]    Culture   Final    NO GROWTH Performed at [facility], [place] [address], [place], [place] [address]    Report Status 01/29/2018 FINAL  Final  Culture, blood (Routine X 2) w Reflex to ID Panel     Status: None    Collection Time: 01/28/18  6:45 AM  Result Value Ref Range Status   Specimen Description BLOOD LAC  Final   Special Requests   Final    BOTTLES DRAWN AEROBIC AND ANAEROBIC Blood Culture adequate volume   Culture   Final    NO GROWTH 5 DAYS Performed at [facility], [place] [place], [address] [address]    Report Status 02/15/2018 FINAL  Final  Culture, blood (Routine X 2) w Reflex to ID Panel     Status: None   Collection Time: 01/28/18  6:56 AM  Result Value Ref Range Status   Specimen Description BLOOD RAC  Final   Special Requests   Final    BOTTLES DRAWN AEROBIC AND ANAEROBIC Blood Culture results may not be optimal due to an inadequate volume of blood received in culture bottles   Culture   Final    NO GROWTH 5 DAYS Performed at [facility], [address], [place], [place] [address]    Report Status 03/02/2018 FINAL  Final  Culture, respiratory (non-expectorated)     Status: None   Collection Time: 01/28/18 11:24 AM  Result Value Ref Range Status   Specimen Description   Final    TRACHEAL ASPIRATE Performed at [facility], [address] [place], [place] [address]    Special Requests   Final    NONE Performed at [facility], [place] [place], [place] [address]    Gram Stain   Final    RARE WBC PRESENT,BOTH PMN AND MONONUCLEAR RARE GRAM POSITIVE COCCI IN PAIRS    Culture   Final    RARE Consistent with normal respiratory flora. Performed at [facility], [place] [address] [place] [address] [place], [place] [address]    Report Status 01/30/2018 FINAL  Final  Culture, respiratory (non-expectorated)     Status: None (Preliminary result)   Collection Time: 02/03/18  7:53 AM  Result Value Ref Range Status   Specimen Description   Final    TRACHEAL ASPIRATE Performed at [facility], [address] [place],  [address]    Special Requests   Final    NONE Performed at [facility], [place],  [place], [place] [address]    Gram Stain   Final    ABUNDANT WBC PRESENT, PREDOMINANTLY PMN FEW GRAM NEGATIVE RODS    Culture   Final    FEW BURKHOLDERIA SPECIES Sent to [facility] for further susceptibility testing. Performed at [facility], [place] [address], [place], [place] [address]    Report Status PENDING  Incomplete  Susceptibility, Aer + Anaerob     Status: None   Collection Time: 02/03/18  7:53 AM  Result Value Ref Range Status   Suscept, Aer + Anaerob Preliminary report  Final    Comment: (NOTE) Performed At: [facility] [place], [place] [id number] Rush Farmer MD WG:[id number]    Source of Sample BURKHOLDERIA SPECIES/ TRACHEAL ASPIRATE  Final    Comment: Performed at [facility], [place] [address] [place], [place] [address]  Susceptibility Result     Status: None (Preliminary result)   Collection Time: 02/03/18  7:53 AM  Result Value Ref Range Status   Suscept Result 1 Burkholderia species  Final    Comment: (NOTE) Identification performed by account, not confirmed by this laboratory. Performed At: [facility] [place], [place] [id number] Rush Farmer MD KG:[id number]    Antimicrobial Suscept PENDING  Incomplete    Coagulation Studies: No results for input(s): LABPROT, INR in the last 72 hours.  Urinalysis: No results for input(s): COLORURINE, LABSPEC, PHURINE, GLUCOSEU, HGBUR, BILIRUBINUR, KETONESUR, PROTEINUR, UROBILINOGEN, NITRITE, LEUKOCYTESUR in the last 72 hours.  Invalid input(s): APPERANCEUR    Imaging: No results found.   Medications:   . sodium chloride Stopped (02/26/2018 1838)  . sodium chloride 10 mL/hr at 2018/02/16 0700  . sodium chloride    . sodium chloride    . fentaNYL infusion INTRAVENOUS 350 mcg/hr (02-16-2018 1005)  . meropenem (MERREM) IV Stopped (February 16, 2018 6440)  . midazolam (VERSED) infusion 1.5 mg/hr (02/16/18 0946)   . B-complex with vitamin C  1 tablet Per Tube QHS  .  chlorhexidine gluconate (MEDLINE KIT)  15 mL Mouth Rinse BID  . Chlorhexidine Gluconate Cloth  6 each Topical Q0600  . famotidine  20 mg Per Tube QHS  . feeding supplement (PRO-STAT SUGAR FREE 64)  30 mL Per Tube BID  . feeding supplement (VITAL HIGH PROTEIN)  1,000 mL Per Tube Q24H  . heparin injection (subcutaneous)  5,000 Units Subcutaneous Q8H  . insulin aspart  0-20 Units Subcutaneous Q4H  . insulin glargine  5 Units Subcutaneous QHS  . ipratropium-albuterol  3 mL Nebulization Q6H  . mouth rinse  15 mL Mouth Rinse 10 times per day  . metoprolol tartrate  5 mg Intravenous Q6H  . multivitamin  15 mL Per Tube Daily  . sodium chloride flush  10-40 mL Intracatheter Q12H   sodium chloride, sodium chloride, sodium chloride, acetaminophen **OR** acetaminophen, alteplase, fentaNYL, heparin, heparin, hydrALAZINE, lidocaine (PF), lidocaine-prilocaine, midazolam, pentafluoroprop-tetrafluoroeth, sodium chloride flush  Assessment/ Plan:  Mr. TYR FRANCA is a 57 y.o. black male with diabetes mellitus type 2, hypertension, obstructive sleep apnea, who was admitted to [facility] on7/26/2019for evaluation of cardiac arrest that occurred at home.   1. Acute renal failure, oliguric. Baseline creatinine 0.68 12/13/17 2. Metabolic acidosis   3. Hyperkalemia 4. Acute respiratory failure requiring mechanical ventilation  5. Cardiac arrest, completed hypothermia protocol. 6. Anemia with renal failure. 7. Hyperphosphatemia  Plan: Hemodialysis treatment yesterday for hyperkalemia and azotemia.  No ultrafiltration since patient is nonoliguric with chance of recovery.  Overall prognosis is poor.  Family is discussing comfort care options.    LOS: [address] [place] 2019-02-1409:14 AM

## 2018-03-05 NOTE — Progress Notes (Signed)
This Afternoon, patient Had sudden and acute decline in medical condition.  Patient with increased WOB and severe hypoxia, fio2 increased to 100%. HR increased, increased RR   I have updated the wife and stated that patient may NOT survive till tomorrow.  She has called the family to bedside, The family and wife have decided and consented to Comfort measures today.  I have explained the process of comfort care and will place orders.    Family are satisfied with Plan of action and management. All questions answered  Corrin Parker, M.D.  [facility]  Medical Director [place] Director [facility] Cardio-Pulmonary Department

## 2018-03-05 DEATH — deceased

## 2019-02-21 IMAGING — DX DG CHEST 1V PORT
1 series · 1 of 1 positions shown · non-contrast
Comparison: 02/03/2018

CLINICAL DATA: Mechanically assisted ventilation.

EXAM:
PORTABLE CHEST 1 VIEW

[chest ap]
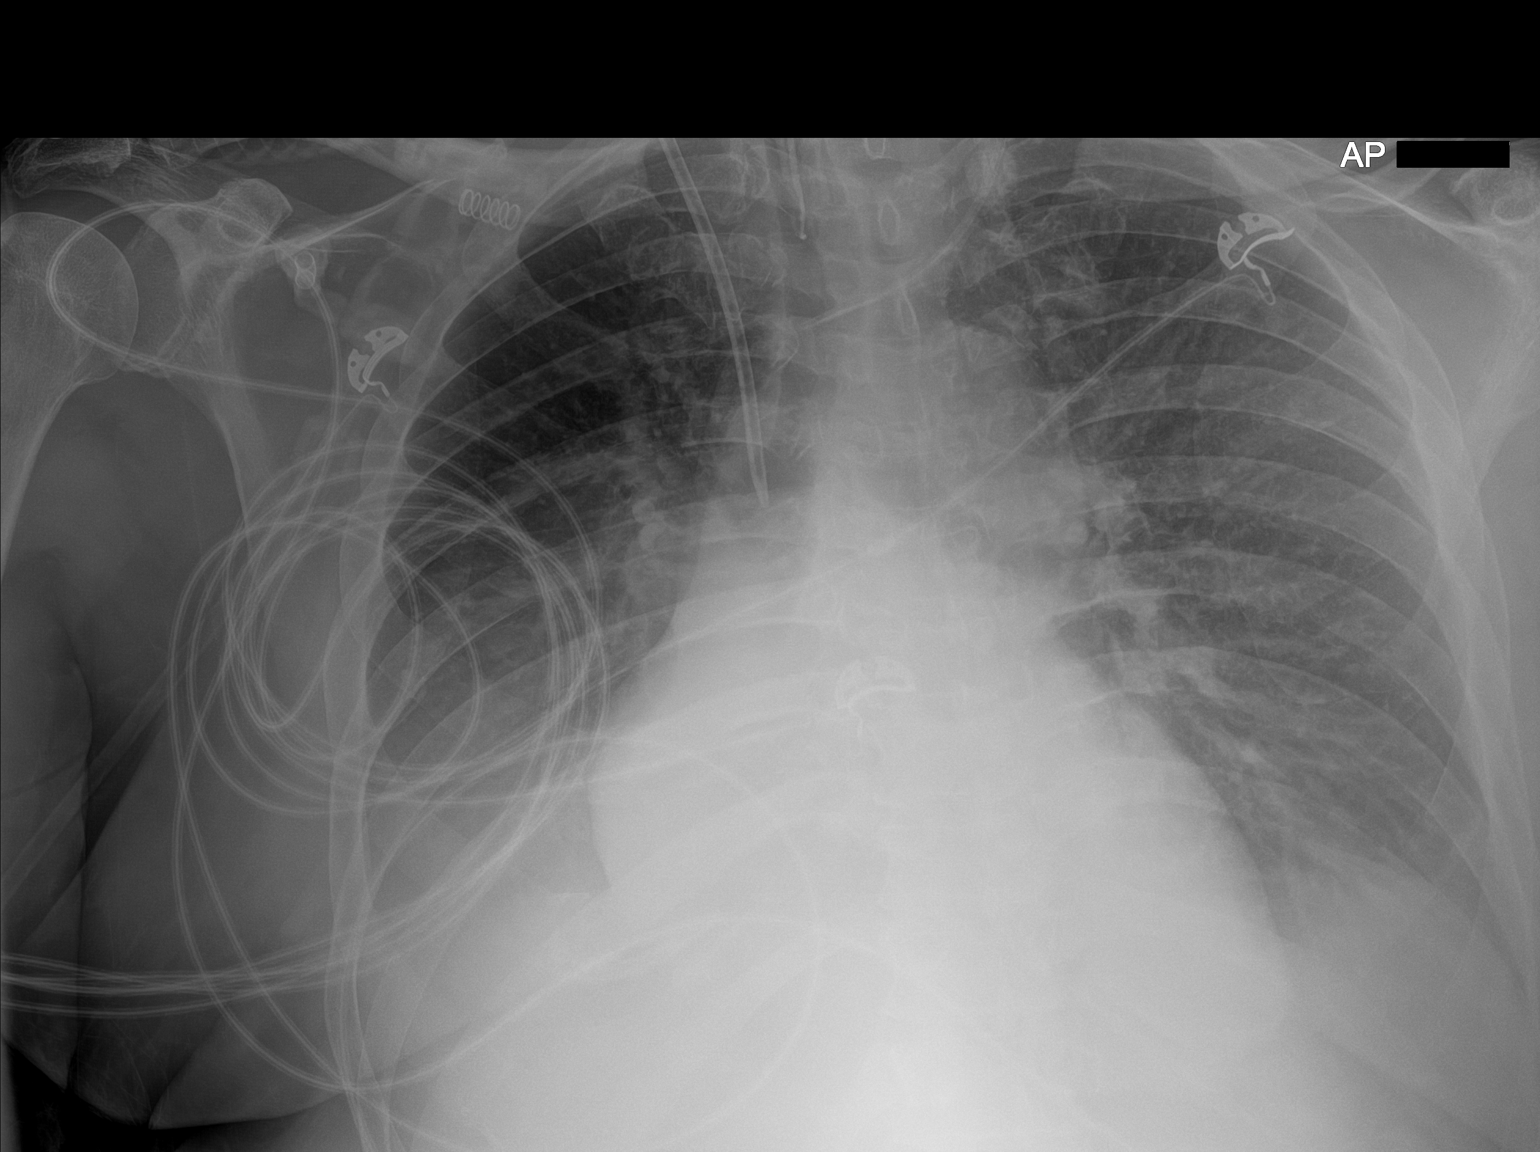

[1 of 1 positions shown; findings below may reference images not displayed]

FINDINGS: Endotracheal tube terminates 5.5 cm above the carina. Right jugular
catheter terminates over the mid SVC. Left jugular catheter
terminates over the upper SVC. The cardiac silhouette remains
enlarged with similar appearance of pulmonary vascular congestion.
Right greater than left lower lung hazy opacities are also similar
to the prior study and likely represent small pleural effusions and
atelectasis. No pneumothorax is identified.
IMPRESSION: Unchanged pulmonary vascular congestion, pleural effusions, and
bibasilar atelectasis.
